# Patient Record
Sex: Male | Born: 1960
Health system: Southern US, Community
[De-identification: ages and names within clinical notes are randomized; demographics above are authoritative.]

## PROBLEM LIST (undated history)

## (undated) DIAGNOSIS — R011 Cardiac murmur, unspecified: Secondary | ICD-10-CM

## (undated) DIAGNOSIS — M1990 Unspecified osteoarthritis, unspecified site: Secondary | ICD-10-CM

## (undated) DIAGNOSIS — R1901 Right upper quadrant abdominal swelling, mass and lump: Secondary | ICD-10-CM

## (undated) DIAGNOSIS — D689 Coagulation defect, unspecified: Secondary | ICD-10-CM

## (undated) DIAGNOSIS — Z9221 Personal history of antineoplastic chemotherapy: Secondary | ICD-10-CM

## (undated) DIAGNOSIS — C189 Malignant neoplasm of colon, unspecified: Principal | ICD-10-CM

## (undated) DIAGNOSIS — IMO0002 Reserved for concepts with insufficient information to code with codable children: Secondary | ICD-10-CM

## (undated) DIAGNOSIS — Z923 Personal history of irradiation: Secondary | ICD-10-CM

## (undated) DIAGNOSIS — I10 Essential (primary) hypertension: Secondary | ICD-10-CM

## (undated) DIAGNOSIS — C4490 Unspecified malignant neoplasm of skin, unspecified: Secondary | ICD-10-CM

## (undated) DIAGNOSIS — E785 Hyperlipidemia, unspecified: Secondary | ICD-10-CM

## (undated) DIAGNOSIS — E611 Iron deficiency: Secondary | ICD-10-CM

## (undated) DIAGNOSIS — I82409 Acute embolism and thrombosis of unspecified deep veins of unspecified lower extremity: Secondary | ICD-10-CM

## (undated) HISTORY — PX: COLON SURGERY: SHX602

## (undated) HISTORY — DX: Right upper quadrant abdominal swelling, mass and lump: R19.01

## (undated) HISTORY — PX: ROTATOR CUFF REPAIR: SHX139

## (undated) HISTORY — DX: Reserved for concepts with insufficient information to code with codable children: IMO0002

## (undated) HISTORY — PX: TONSILLECTOMY: SUR1361

## (undated) HISTORY — DX: Personal history of irradiation: Z92.3

## (undated) HISTORY — DX: Coagulation defect, unspecified: D68.9

## (undated) HISTORY — PX: OTHER SURGICAL HISTORY: SHX169

## (undated) HISTORY — PX: JOINT REPLACEMENT: SHX530

## (undated) HISTORY — DX: Malignant neoplasm of colon, unspecified: C18.9

## (undated) HISTORY — DX: Hyperlipidemia, unspecified: E78.5

## (undated) HISTORY — PX: POLYPECTOMY: SHX149

## (undated) HISTORY — PX: COLONOSCOPY: SHX174

## (undated) HISTORY — DX: Essential (primary) hypertension: I10

## (undated) HISTORY — PX: SKIN CANCER EXCISION: SHX779

## (undated) HISTORY — PX: SMALL INTESTINE SURGERY: SHX150

---

## 1990-05-18 DIAGNOSIS — Z8701 Personal history of pneumonia (recurrent): Secondary | ICD-10-CM

## 1990-05-18 HISTORY — DX: Personal history of pneumonia (recurrent): Z87.01

## 1994-05-18 HISTORY — PX: MOHS SURGERY: SUR867

## 2010-11-10 ENCOUNTER — Other Ambulatory Visit: Payer: Self-pay | Admitting: Family Medicine

## 2010-11-10 DIAGNOSIS — R1900 Intra-abdominal and pelvic swelling, mass and lump, unspecified site: Secondary | ICD-10-CM

## 2010-11-11 ENCOUNTER — Other Ambulatory Visit: Payer: Self-pay | Admitting: Family Medicine

## 2010-11-11 ENCOUNTER — Ambulatory Visit
Admission: RE | Admit: 2010-11-11 | Discharge: 2010-11-11 | Disposition: A | Discharge status: Home / Self Care | Payer: BC Managed Care – PPO | Source: Ambulatory Visit | Attending: Family Medicine | Admitting: Family Medicine

## 2010-11-11 DIAGNOSIS — R1900 Intra-abdominal and pelvic swelling, mass and lump, unspecified site: Secondary | ICD-10-CM

## 2010-11-11 MED ORDER — IOHEXOL 300 MG/ML  SOLN
100.0000 mL | Freq: Once | INTRAMUSCULAR | Status: AC | PRN
  Administered 2010-11-11: 100 mL via INTRAVENOUS

## 2010-11-14 ENCOUNTER — Ambulatory Visit (INDEPENDENT_AMBULATORY_CARE_PROVIDER_SITE_OTHER): Payer: BC Managed Care – PPO | Admitting: General Surgery

## 2010-11-20 ENCOUNTER — Ambulatory Visit (INDEPENDENT_AMBULATORY_CARE_PROVIDER_SITE_OTHER): Payer: BC Managed Care – PPO | Admitting: General Surgery

## 2010-11-20 ENCOUNTER — Encounter (INDEPENDENT_AMBULATORY_CARE_PROVIDER_SITE_OTHER): Payer: Self-pay | Admitting: General Surgery

## 2010-11-20 ENCOUNTER — Telehealth: Payer: Self-pay | Admitting: *Deleted

## 2010-11-20 VITALS — BP 120/82 | HR 73 | Temp 97.6°F | Ht 68.0 in | Wt 158.4 lb

## 2010-11-20 DIAGNOSIS — R1901 Right upper quadrant abdominal swelling, mass and lump: Secondary | ICD-10-CM | POA: Insufficient documentation

## 2010-11-20 MED ORDER — PEG-KCL-NACL-NASULF-NA ASC-C 100 G PO SOLR: 1.0000 | Freq: Once | ORAL | Status: DC

## 2010-11-20 NOTE — Patient Instructions (Signed)
Plan for colonoscopy tomorrow with Dr. Christella Hartigan.

## 2010-11-20 NOTE — Telephone Encounter (Signed)
Pt given instructions for his COLON tomorrow at [facility] with Dr Christella Hartigan. Script sent; unable to print instructions d/t Dr Dwain Sarna in chart when pt arrived here. Pt stated understanding and will call with questions.   [facility] COLON, 11/21/10 1 11:30am, arrive at 10:30am. BOOKING # [id number] with Verlon Au. H&P from Dr Dwain Sarna faxed to [facility]

## 2010-11-20 NOTE — Progress Notes (Signed)
Subjective:     Patient ID: Terry Vargas, male   DOB: January 24, 1961, 50 y.o.   MRN: [id number]    BP 120/82  Pulse 73  Temp(Src) 97.6 F (36.4 C) (Temporal)  Ht 5\' 8"  (1.727 m)  Wt 158 lb 6.4 oz (71.85 kg)  BMI 24.08 kg/m2    HPI This is a healthy 50 year old male who has a recent history of about 3 months of abdominal pain. He notes he has no change in his bowel movements. He has no change in his stool caliber. He has not seen any blood noted in his stools. He does report that Dr. Abigail Miyamoto noted some heme positive stools. He's had fairly normal appetite but has decreased his oral intake due to the pain that he has one each. He has lost 18 pounds over the last 3 months. He is still passing flatus without any difficulty. The pain is aggravated by a large meal. It is relieved by passing flatus and stool. He also notes that he does take a little bit longer for him to urinate at this point as well. He was seen by Dr. Abigail Miyamoto and was referred for a CT scan. His CT scan shows about a 15 cm what appears to be a descending and transverse colon mass with nodal metastases. There is no evidence of any liver metastases present. The remainder his abdomen shows no real abnormalities. He has no history of a colonoscopy in the past. It does sound like he has a family history of colon cancer his father is well as a cousin who was in his 30s when he had colon cancer.  Past Medical History  Diagnosis Date  . Abdominal mass, RUQ (right upper quadrant)    Past Surgical History  Procedure Date  . Mohs surgery     left ear      No current outpatient prescriptions on file.  No Known Allergies   Review of Systems  Constitutional: Positive for unexpected weight change. Negative for fever, chills, diaphoresis, activity change, appetite change and fatigue.  HENT: Negative.   Respiratory: Negative.   Cardiovascular: Negative.   Gastrointestinal: Positive for abdominal distention.  Genitourinary: Positive for  difficulty urinating. Negative for dysuria, urgency and decreased urine volume.  Musculoskeletal: Negative.   Neurological: Negative.   Hematological: Negative.   Psychiatric/Behavioral: Negative.        Objective:   Physical Exam  Constitutional: He appears well-developed and well-nourished. No distress.  HENT:  Head: Normocephalic and atraumatic.  Neck: Neck supple.  Cardiovascular: Normal rate, regular rhythm and normal heart sounds.   Pulmonary/Chest: Effort normal and breath sounds normal. No respiratory distress. He has no wheezes. He has no rales.  Abdominal: Soft. Bowel sounds are normal. He exhibits mass (large right sided abdominal mass that is movable). There is no tenderness. There is no rebound and no guarding.  Lymphadenopathy:    He has no cervical adenopathy.       Assessment:     Abdominal mass    Plan:      I had a prolonged conversation with the patient and his wife today. I discussed with him that there is a right sided abdominal mass it is very large in size. I think this is likely a colon cancer given his symptoms as well as its appearance on the CT scan. I discussed with him the staging and pathophysiology of colon cancer I discussed with him all of the different treatment options for colon cancer. He does have some  mild obstructive symptoms at this point so he is going to need surgery is his first treatment obviously. I discussed an open right hemicolectomy with him. I discussed laparoscopic surgery but has not been to be an option given the size of this tumor. We discussed the risks of surgery including bleeding, infection, anastomotic leak, reoperation, pneumonia, UTI, DVT, PE. They both understand all of these risks him and plan on getting scheduled as soon as possible for this. He is not completely obstructing this is not an emergency at this point. I would like to try and get colonoscopy on him at least up to this mass to prove that the remainder of his colon  is clean especially given his family history. This would certainly change the operative plan if he had more disease that was present it may be up and remove some additional polyps with the colonoscope. Dr. Wendall Papa has kindly agreed to perform his endoscopy tomorrow.

## 2010-11-21 ENCOUNTER — Ambulatory Visit (HOSPITAL_COMMUNITY)
Admission: RE | Admit: 2010-11-21 | Discharge: 2010-11-21 | Disposition: A | Discharge status: Home / Self Care | Payer: BC Managed Care – PPO | Source: Ambulatory Visit | Attending: Gastroenterology | Admitting: Gastroenterology

## 2010-11-21 ENCOUNTER — Other Ambulatory Visit: Payer: BC Managed Care – PPO | Admitting: Gastroenterology

## 2010-11-21 DIAGNOSIS — K921 Melena: Secondary | ICD-10-CM | POA: Insufficient documentation

## 2010-11-21 DIAGNOSIS — K639 Disease of intestine, unspecified: Secondary | ICD-10-CM | POA: Insufficient documentation

## 2010-11-21 DIAGNOSIS — R109 Unspecified abdominal pain: Secondary | ICD-10-CM | POA: Insufficient documentation

## 2010-11-21 DIAGNOSIS — Z800 Family history of malignant neoplasm of digestive organs: Secondary | ICD-10-CM

## 2010-11-21 DIAGNOSIS — R933 Abnormal findings on diagnostic imaging of other parts of digestive tract: Secondary | ICD-10-CM

## 2010-11-21 DIAGNOSIS — C189 Malignant neoplasm of colon, unspecified: Secondary | ICD-10-CM

## 2010-11-24 ENCOUNTER — Other Ambulatory Visit (INDEPENDENT_AMBULATORY_CARE_PROVIDER_SITE_OTHER): Payer: Self-pay | Admitting: General Surgery

## 2010-11-24 ENCOUNTER — Encounter (HOSPITAL_COMMUNITY)
Admission: RE | Admit: 2010-11-24 | Discharge: 2010-11-24 | Disposition: A | Discharge status: Home / Self Care | Payer: BC Managed Care – PPO | Source: Ambulatory Visit | Attending: General Surgery | Admitting: General Surgery

## 2010-11-24 ENCOUNTER — Ambulatory Visit (HOSPITAL_COMMUNITY)
Admission: RE | Admit: 2010-11-24 | Discharge: 2010-11-24 | Disposition: A | Discharge status: Home / Self Care | Payer: BC Managed Care – PPO | Source: Ambulatory Visit | Attending: General Surgery | Admitting: General Surgery

## 2010-11-24 DIAGNOSIS — Z01810 Encounter for preprocedural cardiovascular examination: Secondary | ICD-10-CM | POA: Insufficient documentation

## 2010-11-24 DIAGNOSIS — Z01811 Encounter for preprocedural respiratory examination: Secondary | ICD-10-CM | POA: Insufficient documentation

## 2010-11-24 DIAGNOSIS — Z01812 Encounter for preprocedural laboratory examination: Secondary | ICD-10-CM | POA: Insufficient documentation

## 2010-11-24 DIAGNOSIS — R1901 Right upper quadrant abdominal swelling, mass and lump: Secondary | ICD-10-CM

## 2010-11-24 LAB — CBC
HCT: 31.7 % — ABNORMAL LOW (ref 39.0–52.0)
MCHC: 32.2 g/dL (ref 30.0–36.0)
Platelets: 391 10*3/uL (ref 150–400)
RDW: 14.3 % (ref 11.5–15.5)
WBC: 9.1 10*3/uL (ref 4.0–10.5)

## 2010-11-24 LAB — BASIC METABOLIC PANEL
BUN: 34 mg/dL — ABNORMAL HIGH (ref 6–23)
Calcium: 9.3 mg/dL (ref 8.4–10.5)
Creatinine, Ser: 0.94 mg/dL (ref 0.50–1.35)
GFR calc Af Amer: >60 mL/min (ref >60)
GFR calc non Af Amer: >60 mL/min (ref >60)
Glucose, Bld: 108 mg/dL — ABNORMAL HIGH (ref 70–99)
Potassium: 4.2 mEq/L (ref 3.5–5.1)

## 2010-11-24 LAB — CEA: CEA: 1 ng/mL (ref 0.0–5.0)

## 2010-11-25 ENCOUNTER — Other Ambulatory Visit (INDEPENDENT_AMBULATORY_CARE_PROVIDER_SITE_OTHER): Payer: Self-pay | Admitting: General Surgery

## 2010-11-25 ENCOUNTER — Inpatient Hospital Stay (HOSPITAL_COMMUNITY)
Admission: RE | Admit: 2010-11-25 | Discharge: 2010-12-03 | DRG: 148 | Disposition: A | Discharge status: Home / Self Care | Payer: BC Managed Care – PPO | Source: Ambulatory Visit | Attending: General Surgery | Admitting: General Surgery

## 2010-11-25 DIAGNOSIS — C184 Malignant neoplasm of transverse colon: Principal | ICD-10-CM | POA: Diagnosis present

## 2010-11-25 DIAGNOSIS — Z800 Family history of malignant neoplasm of digestive organs: Secondary | ICD-10-CM

## 2010-11-25 DIAGNOSIS — C189 Malignant neoplasm of colon, unspecified: Secondary | ICD-10-CM

## 2010-11-25 DIAGNOSIS — K560 Paralytic ileus: Secondary | ICD-10-CM | POA: Diagnosis not present

## 2010-11-25 HISTORY — PX: OTHER SURGICAL HISTORY: SHX169

## 2010-11-26 LAB — CBC
HCT: 30 % — ABNORMAL LOW (ref 39.0–52.0)
Hemoglobin: 9.6 g/dL — ABNORMAL LOW (ref 13.0–17.0)
MCHC: 32 g/dL (ref 30.0–36.0)
MCV: 78.9 fL (ref 78.0–100.0)
RDW: 14.4 % (ref 11.5–15.5)

## 2010-11-26 LAB — TYPE AND SCREEN: Antibody Screen: NEGATIVE

## 2010-11-26 LAB — BASIC METABOLIC PANEL
BUN: 14 mg/dL (ref 6–23)
Chloride: 104 mEq/L (ref 96–112)
Creatinine, Ser: 0.91 mg/dL (ref 0.50–1.35)
GFR calc Af Amer: >60 mL/min (ref >60)
GFR calc non Af Amer: >60 mL/min (ref >60)
Glucose, Bld: 124 mg/dL — ABNORMAL HIGH (ref 70–99)
Potassium: 4 mEq/L (ref 3.5–5.1)

## 2010-11-27 NOTE — Op Note (Signed)
NAMEMAHDI, FRYE NO.:  [id number]  MEDICAL RECORD NO.:  [id number]  LOCATION:  5150                         FACILITY:  [facility]  PHYSICIAN:  Juanetta Gosling, MDDATE OF BIRTH:  20-May-1960  DATE OF PROCEDURE:  11/25/2010 DATE OF DISCHARGE:                              OPERATIVE REPORT   PREOPERATIVE DIAGNOSIS:  Right colon mass.  POSTOPERATIVE DIAGNOSIS:  Proximal transverse colon mass, locally advanced.  PROCEDURES: 1. Open right hemicolectomy. 2. Mobilization of splenic flexure.  SURGEON:  Juanetta Gosling, MD  ASSISTANT:  Wilmon Arms. Corliss Skains, MD  ANESTHESIA:  General.  SUPERVISING ANESTHESIOLOGIST:  Quita Skye. Krista Blue, MD  ESTIMATED BLOOD LOSS:  200 mL.  DRAINS:  A 19-French Blake drain above the duodenum and pancreas.  COMPLICATIONS:  None.  DISPOSITION:  To recovery room in stable condition.  SPECIMENS:  Right colon and nodal base sent to pathology.  INDICATIONS:  This is a 50 year old male with a recent onset of abdominal pain.  He has a family history of colon cancer.  He was sent to me for a CT scan showing a very large intra-abdominal mass.  This did appear to be a colonic primary and was palpable.  On his examination, he was not obstructed.  At that time, I sent him to Dr. Wendall Papa for evaluation with a colonoscopy to clear his colon up to this point.  This was clear.  I then prepped for surgery and we discussed right colectomy versus an extended right colectomy.  PROCEDURE:  After informed consent was obtained from the patient, he first underwent a bowel preparation at home.  He was administered 5000 units of subcutaneous heparin,entereg and 1 g of intravenous ertapenem prior to beginning surgery.  He was then placed under general anesthesia without complication and orogastric tube and Foley catheter were placed.  His abdomen was prepped and draped in a standard sterile surgical fashion.  Surgical time-out was then  performed.  A midline incision was then made.  This was later extended both above and below due to the size of the tumor.  His peritoneum was entered without any difficulty.  A Balfour retractor was then placed.  He was noted to have a very large locally advanced proximal transverse colon tumor that had ascending colon adherent to it.  This was also appeared to be adherent closely avulsed to his duodenum and the anterior surface of his pancreas.  I took down his white line of Toldt on the right side. This was then rolled up to C-loop of his duodenum.  The small bowel was also mobilized in a similar fashion.  This was also very near his greater curvature of the stomach.  I then came out of it from the other side of the transverse colon after releasing the omentum off in its entirety for two-thirds of the transverse colon.  I then entered into the lesser sac and identified the colonic mesentery.  This was scored. I then spent a fair amount of time doing a dissection where there was tumor as well as significant desmoplastic reaction on the anteriorsurface of the duodenum and his pancreas.  He had a number of enlarged lymph nodes as  well.  With great care, I then dissected this off the anterior surface of the duodenum and pancreas using a combination of silk ligatures and silk ties.  This did not appear to involve the duodenum and after we finally released this from the duodenum, there was some difficulty.  The anterior surface the duodenum appeared clean. This did appear mostly to be desmoplastic reaction.  Eventually, also freed this off the inferior surface of the pancreas as well.  Once I had this, I then pick the spot about 6 cm distal to the tumor on the transverse colon and divided the colon.  I then treated the small bowel in a similar fashion.  I then divided the mesentery down to its base, then used a combination of LigaSure and silk ties to oversew this from either side until I  reached a portion where the tumor appeared to be close to the pancreas.  I was able to obtain a margin grossly with this and eventually, there were two large veins draining the tumor.  Both of these were ligated with silk ligatures.  I then removed the specimen. We obtained hemostasis.  There was a fair amount of oozing on the anterior surface of pancreas and duodenum.  I placed a sponge overlying this for a while and it stopped.  I then irrigated copiously.  I then placed a sponge overlying this and then returned to the anastomosis. This ended up being an extended right hemicolectomy, so I did mobilize the splenic flexure as well as the portion of his descending colon with the combination of blunt dissection as well as electrocautery. Eventually, I was able to free this and the TI was very mobile, so I approximated these two with 3-0 silk sutures.  I then created a common enterotomy with a GIA stapler.  I then closed this with a TA stapler.  I oversewed the staple line with areas that were bleeding.  This appeared this was patent.  I then placed two crotch stitches of 3-0 silk to prevent any kinking from happening.  The defect was too big to close and it was right down on the duodenum and the surface of the pancreas, but there was not really any tissue even close to either side, not closed this defect.  Irrigation was then performed again.  I placed a 19-French Blake drain overlying the pancreas and duodenum.  This was secured with an 2-0 nylon suture.  The omentum was placed over the anastomosis, what was left of the omentum.  The sponge and needle count was correct at this point.  We then closed with #1 loop PDS and then the skin was stapled with Telfa wicks.  Dressings were placed.  The drain was functional.  He was extubated and transferred to recovery room in stable condition.     Juanetta Gosling, MD     MCW/MEDQ  D:  11/25/2010  T:  11/26/2010  Job:   [id number]  Electronically Signed by Emelia Loron MD on 11/27/2010 04:29:55 PM

## 2010-11-28 LAB — CBC
HCT: 24.7 % — ABNORMAL LOW (ref 39.0–52.0)
Hemoglobin: 8 g/dL — ABNORMAL LOW (ref 13.0–17.0)
RBC: 3.12 MIL/uL — ABNORMAL LOW (ref 4.22–5.81)

## 2010-11-28 LAB — BASIC METABOLIC PANEL
BUN: 7 mg/dL (ref 6–23)
CO2: 29 mEq/L (ref 19–32)
Glucose, Bld: 120 mg/dL — ABNORMAL HIGH (ref 70–99)
Potassium: 4.2 mEq/L (ref 3.5–5.1)
Sodium: 135 mEq/L (ref 135–145)

## 2010-11-30 LAB — BASIC METABOLIC PANEL
BUN: 9 mg/dL (ref 6–23)
CO2: 28 mEq/L (ref 19–32)
Calcium: 8.5 mg/dL (ref 8.4–10.5)
Chloride: 99 mEq/L (ref 96–112)
Creatinine, Ser: 0.78 mg/dL (ref 0.50–1.35)
GFR calc Af Amer: >60 mL/min (ref >60)
GFR calc non Af Amer: >60 mL/min (ref >60)
Glucose, Bld: 137 mg/dL — ABNORMAL HIGH (ref 70–99)
Potassium: 4.4 mEq/L (ref 3.5–5.1)
Sodium: 135 mEq/L (ref 135–145)

## 2010-11-30 LAB — CBC
HCT: 29.7 % — ABNORMAL LOW (ref 39.0–52.0)
Hemoglobin: 9.6 g/dL — ABNORMAL LOW (ref 13.0–17.0)
MCH: 25.2 pg — ABNORMAL LOW (ref 26.0–34.0)
MCHC: 32.3 g/dL (ref 30.0–36.0)
MCV: 78 fL (ref 78.0–100.0)
Platelets: 392 10*3/uL (ref 150–400)
RBC: 3.81 MIL/uL — ABNORMAL LOW (ref 4.22–5.81)
RDW: 14.2 % (ref 11.5–15.5)
WBC: 7 10*3/uL (ref 4.0–10.5)

## 2010-11-30 LAB — CREATININE, FLUID (PLEURAL, PERITONEAL, JP DRAINAGE): Creat, Fluid: 0.8 mg/dL

## 2010-11-30 LAB — AMYLASE, BODY FLUID: Amylase, Fluid: 42 U/L

## 2010-12-01 ENCOUNTER — Inpatient Hospital Stay (HOSPITAL_COMMUNITY): Payer: BC Managed Care – PPO

## 2010-12-02 ENCOUNTER — Encounter (INDEPENDENT_AMBULATORY_CARE_PROVIDER_SITE_OTHER): Payer: Self-pay | Admitting: General Surgery

## 2010-12-02 ENCOUNTER — Other Ambulatory Visit (INDEPENDENT_AMBULATORY_CARE_PROVIDER_SITE_OTHER): Payer: Self-pay | Admitting: General Surgery

## 2010-12-02 DIAGNOSIS — C189 Malignant neoplasm of colon, unspecified: Secondary | ICD-10-CM | POA: Insufficient documentation

## 2010-12-02 HISTORY — DX: Malignant neoplasm of colon, unspecified: C18.9

## 2010-12-04 ENCOUNTER — Telehealth (INDEPENDENT_AMBULATORY_CARE_PROVIDER_SITE_OTHER): Payer: Self-pay

## 2010-12-04 NOTE — Telephone Encounter (Signed)
Faxed request for appt. With Dr Truett Perna to [facility]/ [facility] 12-04-10

## 2010-12-05 ENCOUNTER — Ambulatory Visit (INDEPENDENT_AMBULATORY_CARE_PROVIDER_SITE_OTHER): Payer: BC Managed Care – PPO

## 2010-12-05 DIAGNOSIS — C189 Malignant neoplasm of colon, unspecified: Secondary | ICD-10-CM

## 2010-12-05 NOTE — Progress Notes (Signed)
Pt came in for staple removal of colon sx by Dr Dwain Sarna. The pt tolerated the staples being removed along with steri strips applied to the incision. The pt's incision started to drain when pt stood up so I covered the incision with 4x4 gauze and advised pt if any concerns to call our office. Pt has follow up appt with Dr Dwain Sarna on 12-19-10. [facility]

## 2010-12-16 ENCOUNTER — Encounter (HOSPITAL_BASED_OUTPATIENT_CLINIC_OR_DEPARTMENT_OTHER): Payer: BC Managed Care – PPO | Admitting: Oncology

## 2010-12-16 DIAGNOSIS — C182 Malignant neoplasm of ascending colon: Secondary | ICD-10-CM

## 2010-12-17 ENCOUNTER — Telehealth (INDEPENDENT_AMBULATORY_CARE_PROVIDER_SITE_OTHER): Payer: Self-pay

## 2010-12-17 NOTE — Telephone Encounter (Signed)
LMOM for Dr Kalman Drape nurse to call me so we can find out if pt needs PAC insertion./ [facility] 12-17-10

## 2010-12-19 ENCOUNTER — Other Ambulatory Visit: Payer: Self-pay | Admitting: Oncology

## 2010-12-19 ENCOUNTER — Telehealth (INDEPENDENT_AMBULATORY_CARE_PROVIDER_SITE_OTHER): Payer: Self-pay

## 2010-12-19 ENCOUNTER — Encounter (INDEPENDENT_AMBULATORY_CARE_PROVIDER_SITE_OTHER): Payer: Self-pay | Admitting: General Surgery

## 2010-12-19 ENCOUNTER — Ambulatory Visit (INDEPENDENT_AMBULATORY_CARE_PROVIDER_SITE_OTHER): Payer: BC Managed Care – PPO | Admitting: General Surgery

## 2010-12-19 DIAGNOSIS — C189 Malignant neoplasm of colon, unspecified: Secondary | ICD-10-CM

## 2010-12-19 DIAGNOSIS — Z09 Encounter for follow-up examination after completed treatment for conditions other than malignant neoplasm: Secondary | ICD-10-CM

## 2010-12-19 NOTE — Progress Notes (Signed)
Subjective:     Patient ID: Terry Vargas, male   DOB: January 09, 1961, 50 y.o.   MRN: [id number]  HPI This is a 50 year old male who was referred to me for a large right upper quadrant mass. This appeared to be emanating from his colon preoperative CT scan that he had had. He underwent a colonoscopy up to this mass by Dr. Christella Hartigan and there were no other lesions at that point. He then underwent an extended right hemicolectomy for what ended up being a node-positive colon cancer. He did well postoperatively and was discharged from the hospital. He now is having one bowel movement per day, eating well, and returning to normal activities. He has been evaluated by Dr. Truett Perna in the decision is made obviously to begin chemotherapy. We've reviewed his CT scans as well as his history in our multidisciplinary gastrointestinal cancer conference. He does have a couple of other smaller nodes in his celiac area as well as posterior to his pancreas. These are going to be evaluated by PET scan prior to beginning other treatments. I plan on placing a port in him next week.  Review of Systems     Objective:   Physical Exam Well healing abdominal incision without infection, soft, nontender     Assessment:     S/p extended right colectomy for locally advanced colon cancer    Plan:     He is doing well after surgery. He is due to begin chemotherapy in about 2 weeks at this point. I discussed with him a port placement today. We discussed the risks including bleeding, infection, and pneumothorax. He would like this placed on the left side which I will attempt to do.  I released him to full activity today as well.

## 2010-12-19 NOTE — Telephone Encounter (Signed)
Returned voicemail message about what prep to do before surgery. I lmom that the pt can't have anything to eat or drink after midnight the night before surgey./ [facility]

## 2010-12-23 ENCOUNTER — Encounter: Payer: Self-pay | Admitting: General Surgery

## 2010-12-24 ENCOUNTER — Ambulatory Visit (HOSPITAL_COMMUNITY): Payer: BC Managed Care – PPO

## 2010-12-24 ENCOUNTER — Ambulatory Visit (HOSPITAL_COMMUNITY)
Admission: RE | Admit: 2010-12-24 | Discharge: 2010-12-24 | Disposition: A | Discharge status: Home / Self Care | Payer: BC Managed Care – PPO | Source: Ambulatory Visit | Attending: General Surgery | Admitting: General Surgery

## 2010-12-24 DIAGNOSIS — C189 Malignant neoplasm of colon, unspecified: Secondary | ICD-10-CM | POA: Insufficient documentation

## 2010-12-24 LAB — PROTIME-INR: Prothrombin Time: 14.1 seconds (ref 11.6–15.2)

## 2010-12-24 LAB — BASIC METABOLIC PANEL
BUN: 25 mg/dL — ABNORMAL HIGH (ref 6–23)
Calcium: 9.6 mg/dL (ref 8.4–10.5)
Creatinine, Ser: 0.87 mg/dL (ref 0.50–1.35)
GFR calc Af Amer: >60 mL/min (ref >60)

## 2010-12-24 LAB — CBC
HCT: 30.7 % — ABNORMAL LOW (ref 39.0–52.0)
Hemoglobin: 9.9 g/dL — ABNORMAL LOW (ref 13.0–17.0)
MCH: 24.1 pg — ABNORMAL LOW (ref 26.0–34.0)
MCHC: 32.2 g/dL (ref 30.0–36.0)
MCV: 74.9 fL — ABNORMAL LOW (ref 78.0–100.0)

## 2010-12-24 LAB — DIFFERENTIAL
Basophils Relative: 0 % (ref 0–1)
Eosinophils Absolute: 0.2 10*3/uL (ref 0.0–0.7)
Monocytes Absolute: 0.6 10*3/uL (ref 0.1–1.0)
Monocytes Relative: 9 % (ref 3–12)

## 2010-12-25 NOTE — Op Note (Signed)
Terry Vargas, Terry Vargas NO.:  [id number]  MEDICAL RECORD NO.:  [id number]  LOCATION:  DAYL                         FACILITY:  [facility]  PHYSICIAN:  Juanetta Gosling, MDDATE OF BIRTH:  Jun 30, 1960  DATE OF PROCEDURE: DATE OF DISCHARGE:  12/24/2010                              OPERATIVE REPORT   PREOPERATIVE DIAGNOSIS:  Colon cancer status post extended right colectomy.  POSTOPERATIVE DIAGNOSIS:  Colon cancer status post extended right colectomy.  PROCEDURE:  Left subclavian 8-French titanium PowerPort insertion.  SURGEON:  Juanetta Gosling, MD  ASSISTANT:  None.  ANESTHESIA:  General with LMA.  SPECIMENS:  None.  DRAINS:  None.  COMPLICATIONS:  None.  ESTIMATED BLOOD LOSS:  Minimal.  DISPOSITION:  To recovery room in stable condition.  INDICATIONS:  This is a 50 year old male who I saw recently after a very large ascending and transverse colon mass.  He underwent an extended right colectomy for a node positive locally advanced colon cancer.  He also has a couple of nodes in the iliac axis and posterior to his pancreas.  He was then seen by Dr. Truett Perna for adjuvant therapy consideration and we discussed port placement.  We discussed the risks and benefits associated with this procedure.  PROCEDURE IN DETAIL:  After informed consent was obtained, the patient was taken to the operating room.  He had sequential compression devices placed in lower extremities prior to induction of anesthesia.  He was administered 1 g of intravenous cefazolin by choice.  He underwent general anesthesia with an LMA.  He then was prepped and draped in a standard sterile surgical fashion.  Surgical time-out was then performed.  I infiltrated 0.25% Marcaine throughout his left chest wall as well as his clavicle.  I then accessed his subclavian vein on first pass.  I then made a pocket below this overlying the pectoralis muscle.  I then enlarged the insertion site  with a #11 blade.  I had to actually dilate this with a snap as the fascia was very vigorous.  I then used the dilator to enlarge the hole.  I then placed the sheath and the dilator in it following the sit with fluoroscopy.  This was in correct position. I reviewed the wire assembly.  I then placed the line.  This was placed deep.  At that point, I removed the peel-away sheath.  I then tunneled the line to the port site.  I then, under fluoro, pulled back the line to loosen the distal cava.  I clipped the line and secured this to the port with the connector.  I then sewed the port into place with 2-0 Prolene in three positions.  I then flushed this which I did easily and aspirated blood.  I then packed this with heparin.  An additional fluoroscopy view showed the line to be in good position with the sit into the distal cava and no kinks.  The port also was in good position. I then closed this with 3-0 Vicryl, 4-0 Monocryl and Dermabond.  He tolerated this well, was extubated, and transferred to recovery in stable condition.    Juanetta Gosling, MD     MCW/MEDQ  D:  12/24/2010  T:  12/25/2010  Job:  [id number]  cc:   Dr. Gabriel Rainwater K. Abigail Miyamoto, M.D. Fax: [phone]  Electronically Signed by Emelia Loron MD on 12/25/2010 01:59:47 PM

## 2010-12-26 NOTE — Discharge Summary (Signed)
  NAMEDAXEN, LANUM NO.:  [id number]  MEDICAL RECORD NO.:  [id number]  LOCATION:  5150                         FACILITY:  [facility]  PHYSICIAN:  Juanetta Gosling, MDDATE OF BIRTH:  Jul 04, 1960  DATE OF ADMISSION:  11/25/2010 DATE OF DISCHARGE:  12/03/2010                              DISCHARGE SUMMARY   ADMISSION DIAGNOSIS:  Right-sided colon mass.  DISCHARGE DIAGNOSES: 1. Right colon cancer. 2. Status post ileus.  HISTORY AND HOSPITAL COURSE:  This is a 50 year old male who was sent to me by Dr. Tedra Senegal.  Dr. Tedra Senegal noted some heme-positive stools as well as an abdominal mass.  A CT scan showed about 15-cm ascending and transverse colon mass with likely nodal metastases.  He underwent a colonoscopy to the mass to try to clear the rest of his colon.  This was all negative.  At this point in time, he did not know he had a very large family history.  Eventually, he realized that his family does have a history of Lynch syndrome.  He was admitted on November 25, 2010, underwent an open extended right hemicolectomy with mobilization of splenic flexure and primary anastomosis.  Postoperatively, he had an ileus.  His labs remained within normal limits.  He was maintained on a PCA.entereg, as well as subcutaneous heparin.  By postoperative day #5, he was tolerating clears at that point.  By postoperative day #7, he began having bowel movements and passing flatus.  His diet was advanced and he was discharged home on postoperative day #8.  CONSULTS:  Medical Oncology, Dr. Mancel Bale.  PROCEDURES:  November 25, 2010, open extended right hemicolectomy with mobilization of splenic flexure.  PERTINENT RADIOLOGIC EVALUATION:  An abdominal film consistent with an ileus on December 01, 2010.  PERTINENT LABORATORY EVALUATION:  Shows a discharge white blood cell count of 7 and a creatinine of 0.78.  DISCHARGE INSTRUCTIONS:  He was to follow up at [facility] in 2  weeks with Dr. Dwain Sarna, otherwise per his instructions were given at the time of discharge.  MEDICATIONS UPON DISCHARGE:  Ibuprofen and Percocet as needed for pain.     Juanetta Gosling, MD     MCW/MEDQ  D:  12/25/2010  T:  12/26/2010  Job:  [id number]  Electronically Signed by Emelia Loron MD on 12/26/2010 04:01:19 AM

## 2010-12-29 ENCOUNTER — Encounter (HOSPITAL_COMMUNITY)
Admission: RE | Admit: 2010-12-29 | Discharge: 2010-12-29 | Disposition: A | Discharge status: Home / Self Care | Payer: BC Managed Care – PPO | Source: Ambulatory Visit | Attending: Oncology | Admitting: Oncology

## 2010-12-29 ENCOUNTER — Other Ambulatory Visit: Payer: Self-pay | Admitting: Oncology

## 2010-12-29 ENCOUNTER — Encounter (HOSPITAL_BASED_OUTPATIENT_CLINIC_OR_DEPARTMENT_OTHER): Payer: BC Managed Care – PPO | Admitting: Oncology

## 2010-12-29 DIAGNOSIS — C189 Malignant neoplasm of colon, unspecified: Secondary | ICD-10-CM | POA: Insufficient documentation

## 2010-12-29 DIAGNOSIS — I2510 Atherosclerotic heart disease of native coronary artery without angina pectoris: Secondary | ICD-10-CM | POA: Insufficient documentation

## 2010-12-29 DIAGNOSIS — C184 Malignant neoplasm of transverse colon: Secondary | ICD-10-CM

## 2010-12-29 DIAGNOSIS — R599 Enlarged lymph nodes, unspecified: Secondary | ICD-10-CM | POA: Insufficient documentation

## 2010-12-29 LAB — CBC WITH DIFFERENTIAL/PLATELET
Basophils Absolute: 0 10*3/uL (ref 0.0–0.1)
EOS%: 5.4 % (ref 0.0–7.0)
HCT: 29.2 % — ABNORMAL LOW (ref 38.4–49.9)
HGB: 9.7 g/dL — ABNORMAL LOW (ref 13.0–17.1)
MCH: 25.3 pg — ABNORMAL LOW (ref 27.2–33.4)
MCV: 76.2 fL — ABNORMAL LOW (ref 79.3–98.0)
MONO%: 9.3 % (ref 0.0–14.0)
NEUT%: 60.8 % (ref 39.0–75.0)
Platelets: 295 10*3/uL (ref 140–400)
lymph#: 1.4 10*3/uL (ref 0.9–3.3)

## 2010-12-29 LAB — COMPREHENSIVE METABOLIC PANEL
Albumin: 3.7 g/dL (ref 3.5–5.2)
Alkaline Phosphatase: 54 U/L (ref 39–117)
BUN: 24 mg/dL — ABNORMAL HIGH (ref 6–23)
Glucose, Bld: 96 mg/dL (ref 70–99)
Potassium: 4.1 mEq/L (ref 3.5–5.3)

## 2010-12-29 MED ORDER — FLUDEOXYGLUCOSE F - 18 (FDG) INJECTION
16.8000 | Freq: Once | INTRAVENOUS | Status: AC | PRN
  Administered 2010-12-29: 16.8 via INTRAVENOUS

## 2010-12-31 ENCOUNTER — Encounter (HOSPITAL_BASED_OUTPATIENT_CLINIC_OR_DEPARTMENT_OTHER): Payer: BC Managed Care – PPO | Admitting: Oncology

## 2010-12-31 DIAGNOSIS — C184 Malignant neoplasm of transverse colon: Secondary | ICD-10-CM

## 2010-12-31 DIAGNOSIS — C182 Malignant neoplasm of ascending colon: Secondary | ICD-10-CM

## 2010-12-31 DIAGNOSIS — R599 Enlarged lymph nodes, unspecified: Secondary | ICD-10-CM

## 2010-12-31 DIAGNOSIS — Z5111 Encounter for antineoplastic chemotherapy: Secondary | ICD-10-CM

## 2011-01-02 ENCOUNTER — Encounter (HOSPITAL_BASED_OUTPATIENT_CLINIC_OR_DEPARTMENT_OTHER): Payer: BC Managed Care – PPO | Admitting: Oncology

## 2011-01-02 DIAGNOSIS — C182 Malignant neoplasm of ascending colon: Secondary | ICD-10-CM

## 2011-01-02 DIAGNOSIS — C184 Malignant neoplasm of transverse colon: Secondary | ICD-10-CM

## 2011-01-05 ENCOUNTER — Telehealth (INDEPENDENT_AMBULATORY_CARE_PROVIDER_SITE_OTHER): Payer: Self-pay

## 2011-01-05 ENCOUNTER — Encounter (INDEPENDENT_AMBULATORY_CARE_PROVIDER_SITE_OTHER): Payer: Self-pay

## 2011-01-05 NOTE — Telephone Encounter (Signed)
Returned pt's voicemail about needing a note to return to work. Pt requested to go back tomorrow. I completed a note for the pt to go back to work 01-06-11 full duty. The pt will come by the office to p/u at front desk.Hulda Humphrey

## 2011-01-14 ENCOUNTER — Encounter (HOSPITAL_BASED_OUTPATIENT_CLINIC_OR_DEPARTMENT_OTHER): Payer: BC Managed Care – PPO | Admitting: Oncology

## 2011-01-14 ENCOUNTER — Other Ambulatory Visit: Payer: Self-pay | Admitting: Oncology

## 2011-01-14 DIAGNOSIS — Z5111 Encounter for antineoplastic chemotherapy: Secondary | ICD-10-CM

## 2011-01-14 DIAGNOSIS — C184 Malignant neoplasm of transverse colon: Secondary | ICD-10-CM

## 2011-01-14 LAB — COMPREHENSIVE METABOLIC PANEL
Albumin: 3.3 g/dL — ABNORMAL LOW (ref 3.5–5.2)
BUN: 21 mg/dL (ref 6–23)
CO2: 28 mEq/L (ref 19–32)
Calcium: 9.4 mg/dL (ref 8.4–10.5)
Chloride: 103 mEq/L (ref 96–112)
Glucose, Bld: 114 mg/dL — ABNORMAL HIGH (ref 70–99)
Potassium: 3.9 mEq/L (ref 3.5–5.3)

## 2011-01-14 LAB — CBC WITH DIFFERENTIAL/PLATELET
Basophils Absolute: 0 10*3/uL (ref 0.0–0.1)
Eosinophils Absolute: 0.3 10*3/uL (ref 0.0–0.5)
HGB: 10.1 g/dL — ABNORMAL LOW (ref 13.0–17.1)
MCV: 77.9 fL — ABNORMAL LOW (ref 79.3–98.0)
MONO#: 0.4 10*3/uL (ref 0.1–0.9)
NEUT#: 2.5 10*3/uL (ref 1.5–6.5)
RDW: 16.6 % — ABNORMAL HIGH (ref 11.0–14.6)
lymph#: 1.3 10*3/uL (ref 0.9–3.3)

## 2011-01-16 ENCOUNTER — Encounter (HOSPITAL_BASED_OUTPATIENT_CLINIC_OR_DEPARTMENT_OTHER): Payer: BC Managed Care – PPO | Admitting: Oncology

## 2011-01-16 DIAGNOSIS — C184 Malignant neoplasm of transverse colon: Secondary | ICD-10-CM

## 2011-01-28 ENCOUNTER — Encounter (HOSPITAL_BASED_OUTPATIENT_CLINIC_OR_DEPARTMENT_OTHER): Payer: BC Managed Care – PPO | Admitting: Oncology

## 2011-01-28 ENCOUNTER — Other Ambulatory Visit: Payer: Self-pay | Admitting: Oncology

## 2011-01-28 ENCOUNTER — Ambulatory Visit (HOSPITAL_COMMUNITY)
Admission: RE | Admit: 2011-01-28 | Discharge: 2011-01-28 | Disposition: A | Discharge status: Home / Self Care | Payer: BC Managed Care – PPO | Source: Ambulatory Visit | Attending: Oncology | Admitting: Oncology

## 2011-01-28 DIAGNOSIS — M7989 Other specified soft tissue disorders: Secondary | ICD-10-CM

## 2011-01-28 DIAGNOSIS — I82409 Acute embolism and thrombosis of unspecified deep veins of unspecified lower extremity: Secondary | ICD-10-CM | POA: Insufficient documentation

## 2011-01-28 DIAGNOSIS — M79609 Pain in unspecified limb: Secondary | ICD-10-CM

## 2011-01-28 DIAGNOSIS — C189 Malignant neoplasm of colon, unspecified: Secondary | ICD-10-CM

## 2011-01-28 DIAGNOSIS — C184 Malignant neoplasm of transverse colon: Secondary | ICD-10-CM

## 2011-01-28 DIAGNOSIS — Z5111 Encounter for antineoplastic chemotherapy: Secondary | ICD-10-CM

## 2011-01-28 DIAGNOSIS — I82890 Acute embolism and thrombosis of other specified veins: Secondary | ICD-10-CM | POA: Insufficient documentation

## 2011-01-28 HISTORY — DX: Acute embolism and thrombosis of unspecified deep veins of unspecified lower extremity: I82.409

## 2011-01-28 LAB — COMPREHENSIVE METABOLIC PANEL
ALT: 17 U/L (ref 0–53)
Albumin: 3.4 g/dL — ABNORMAL LOW (ref 3.5–5.2)
Alkaline Phosphatase: 65 U/L (ref 39–117)
CO2: 28 mEq/L (ref 19–32)
Glucose, Bld: 104 mg/dL — ABNORMAL HIGH (ref 70–99)
Potassium: 4.1 mEq/L (ref 3.5–5.3)
Sodium: 137 mEq/L (ref 135–145)
Total Protein: 6.8 g/dL (ref 6.0–8.3)

## 2011-01-28 LAB — CBC WITH DIFFERENTIAL/PLATELET
Basophils Absolute: 0.1 10*3/uL (ref 0.0–0.1)
EOS%: 2.6 % (ref 0.0–7.0)
Eosinophils Absolute: 0.1 10*3/uL (ref 0.0–0.5)
HGB: 10.9 g/dL — ABNORMAL LOW (ref 13.0–17.1)
NEUT#: 3 10*3/uL (ref 1.5–6.5)
RBC: 4.26 10*6/uL (ref 4.20–5.82)
RDW: 18.7 % — ABNORMAL HIGH (ref 11.0–14.6)
lymph#: 1.3 10*3/uL (ref 0.9–3.3)
nRBC: 0 % (ref 0–0)

## 2011-01-29 ENCOUNTER — Encounter (HOSPITAL_BASED_OUTPATIENT_CLINIC_OR_DEPARTMENT_OTHER): Payer: BC Managed Care – PPO | Admitting: Oncology

## 2011-01-29 DIAGNOSIS — I82890 Acute embolism and thrombosis of other specified veins: Secondary | ICD-10-CM

## 2011-01-29 DIAGNOSIS — C184 Malignant neoplasm of transverse colon: Secondary | ICD-10-CM

## 2011-01-30 ENCOUNTER — Encounter (HOSPITAL_BASED_OUTPATIENT_CLINIC_OR_DEPARTMENT_OTHER): Payer: BC Managed Care – PPO | Admitting: Oncology

## 2011-01-30 DIAGNOSIS — C184 Malignant neoplasm of transverse colon: Secondary | ICD-10-CM

## 2011-02-11 ENCOUNTER — Other Ambulatory Visit: Payer: Self-pay | Admitting: Oncology

## 2011-02-11 ENCOUNTER — Encounter (HOSPITAL_BASED_OUTPATIENT_CLINIC_OR_DEPARTMENT_OTHER): Payer: BC Managed Care – PPO | Admitting: Oncology

## 2011-02-11 DIAGNOSIS — Z5111 Encounter for antineoplastic chemotherapy: Secondary | ICD-10-CM

## 2011-02-11 DIAGNOSIS — I824Z9 Acute embolism and thrombosis of unspecified deep veins of unspecified distal lower extremity: Secondary | ICD-10-CM

## 2011-02-11 DIAGNOSIS — C184 Malignant neoplasm of transverse colon: Secondary | ICD-10-CM

## 2011-02-11 DIAGNOSIS — C189 Malignant neoplasm of colon, unspecified: Secondary | ICD-10-CM

## 2011-02-11 LAB — CBC WITH DIFFERENTIAL/PLATELET
BASO%: 1 % (ref 0.0–2.0)
EOS%: 3.7 % (ref 0.0–7.0)
Eosinophils Absolute: 0.2 10*3/uL (ref 0.0–0.5)
LYMPH%: 30.1 % (ref 14.0–49.0)
MCHC: 33.2 g/dL (ref 32.0–36.0)
MCV: 79.1 fL — ABNORMAL LOW (ref 79.3–98.0)
MONO%: 15.2 % — ABNORMAL HIGH (ref 0.0–14.0)
NEUT#: 2 10*3/uL (ref 1.5–6.5)
RBC: 4.3 10*6/uL (ref 4.20–5.82)
RDW: 20.2 % — ABNORMAL HIGH (ref 11.0–14.6)
nRBC: 0 % (ref 0–0)

## 2011-02-11 LAB — COMPREHENSIVE METABOLIC PANEL
ALT: 78 U/L — ABNORMAL HIGH (ref 0–53)
AST: 43 U/L — ABNORMAL HIGH (ref 0–37)
Alkaline Phosphatase: 63 U/L (ref 39–117)
CO2: 28 mEq/L (ref 19–32)
Creatinine, Ser: 0.88 mg/dL (ref 0.50–1.35)
Sodium: 137 mEq/L (ref 135–145)
Total Bilirubin: 0.2 mg/dL — ABNORMAL LOW (ref 0.3–1.2)
Total Protein: 7 g/dL (ref 6.0–8.3)

## 2011-02-13 ENCOUNTER — Encounter (HOSPITAL_BASED_OUTPATIENT_CLINIC_OR_DEPARTMENT_OTHER): Payer: BC Managed Care – PPO | Admitting: Oncology

## 2011-02-13 DIAGNOSIS — C189 Malignant neoplasm of colon, unspecified: Secondary | ICD-10-CM

## 2011-02-25 ENCOUNTER — Other Ambulatory Visit: Payer: Self-pay | Admitting: Oncology

## 2011-02-25 ENCOUNTER — Encounter (HOSPITAL_BASED_OUTPATIENT_CLINIC_OR_DEPARTMENT_OTHER): Payer: BC Managed Care – PPO | Admitting: Oncology

## 2011-02-25 DIAGNOSIS — C184 Malignant neoplasm of transverse colon: Secondary | ICD-10-CM

## 2011-02-25 DIAGNOSIS — R599 Enlarged lymph nodes, unspecified: Secondary | ICD-10-CM

## 2011-02-25 DIAGNOSIS — C182 Malignant neoplasm of ascending colon: Secondary | ICD-10-CM

## 2011-02-25 DIAGNOSIS — Z5111 Encounter for antineoplastic chemotherapy: Secondary | ICD-10-CM

## 2011-02-25 DIAGNOSIS — C189 Malignant neoplasm of colon, unspecified: Secondary | ICD-10-CM

## 2011-02-25 LAB — COMPREHENSIVE METABOLIC PANEL
ALT: 165 U/L — ABNORMAL HIGH (ref 0–53)
Alkaline Phosphatase: 67 U/L (ref 39–117)
CO2: 29 mEq/L (ref 19–32)
Creatinine, Ser: 0.83 mg/dL (ref 0.50–1.35)
Glucose, Bld: 107 mg/dL — ABNORMAL HIGH (ref 70–99)
Sodium: 136 mEq/L (ref 135–145)
Total Bilirubin: 0.2 mg/dL — ABNORMAL LOW (ref 0.3–1.2)
Total Protein: 6.9 g/dL (ref 6.0–8.3)

## 2011-02-25 LAB — CBC WITH DIFFERENTIAL/PLATELET
BASO%: 1.3 % (ref 0.0–2.0)
HCT: 36.5 % — ABNORMAL LOW (ref 38.4–49.9)
LYMPH%: 33.3 % (ref 14.0–49.0)
MCHC: 33.4 g/dL (ref 32.0–36.0)
MCV: 80.9 fL (ref 79.3–98.0)
MONO#: 0.6 10*3/uL (ref 0.1–0.9)
MONO%: 15.5 % — ABNORMAL HIGH (ref 0.0–14.0)
NEUT%: 47.6 % (ref 39.0–75.0)
Platelets: 142 10*3/uL (ref 140–400)
RBC: 4.51 10*6/uL (ref 4.20–5.82)

## 2011-02-27 ENCOUNTER — Encounter (HOSPITAL_BASED_OUTPATIENT_CLINIC_OR_DEPARTMENT_OTHER): Payer: BC Managed Care – PPO | Admitting: Oncology

## 2011-02-27 DIAGNOSIS — C189 Malignant neoplasm of colon, unspecified: Secondary | ICD-10-CM

## 2011-02-27 DIAGNOSIS — Z452 Encounter for adjustment and management of vascular access device: Secondary | ICD-10-CM

## 2011-03-11 ENCOUNTER — Other Ambulatory Visit: Payer: Self-pay | Admitting: Oncology

## 2011-03-11 ENCOUNTER — Encounter (HOSPITAL_BASED_OUTPATIENT_CLINIC_OR_DEPARTMENT_OTHER): Payer: BC Managed Care – PPO | Admitting: Oncology

## 2011-03-11 DIAGNOSIS — Z5111 Encounter for antineoplastic chemotherapy: Secondary | ICD-10-CM

## 2011-03-11 DIAGNOSIS — I824Z9 Acute embolism and thrombosis of unspecified deep veins of unspecified distal lower extremity: Secondary | ICD-10-CM

## 2011-03-11 DIAGNOSIS — C189 Malignant neoplasm of colon, unspecified: Secondary | ICD-10-CM

## 2011-03-11 DIAGNOSIS — C182 Malignant neoplasm of ascending colon: Secondary | ICD-10-CM

## 2011-03-11 DIAGNOSIS — D709 Neutropenia, unspecified: Secondary | ICD-10-CM

## 2011-03-11 DIAGNOSIS — R599 Enlarged lymph nodes, unspecified: Secondary | ICD-10-CM

## 2011-03-11 DIAGNOSIS — C184 Malignant neoplasm of transverse colon: Secondary | ICD-10-CM

## 2011-03-11 LAB — CBC WITH DIFFERENTIAL/PLATELET
Eosinophils Absolute: 0.1 10*3/uL (ref 0.0–0.5)
HCT: 35.2 % — ABNORMAL LOW (ref 38.4–49.9)
LYMPH%: 36.8 % (ref 14.0–49.0)
MCV: 82.8 fL (ref 79.3–98.0)
MONO#: 0.4 10*3/uL (ref 0.1–0.9)
MONO%: 13.4 % (ref 0.0–14.0)
NEUT#: 1.4 10*3/uL — ABNORMAL LOW (ref 1.5–6.5)
NEUT%: 46.8 % (ref 39.0–75.0)
Platelets: 124 10*3/uL — ABNORMAL LOW (ref 140–400)
WBC: 3 10*3/uL — ABNORMAL LOW (ref 4.0–10.3)

## 2011-03-11 LAB — COMPREHENSIVE METABOLIC PANEL
BUN: 8 mg/dL (ref 6–23)
CO2: 26 mEq/L (ref 19–32)
Calcium: 9.4 mg/dL (ref 8.4–10.5)
Chloride: 103 mEq/L (ref 96–112)
Creatinine, Ser: 0.95 mg/dL (ref 0.50–1.35)
Glucose, Bld: 102 mg/dL — ABNORMAL HIGH (ref 70–99)
Total Bilirubin: 0.2 mg/dL — ABNORMAL LOW (ref 0.3–1.2)

## 2011-03-13 ENCOUNTER — Encounter (HOSPITAL_BASED_OUTPATIENT_CLINIC_OR_DEPARTMENT_OTHER): Payer: BC Managed Care – PPO | Admitting: Oncology

## 2011-03-13 DIAGNOSIS — C189 Malignant neoplasm of colon, unspecified: Secondary | ICD-10-CM

## 2011-03-13 DIAGNOSIS — Z5189 Encounter for other specified aftercare: Secondary | ICD-10-CM

## 2011-03-15 ENCOUNTER — Other Ambulatory Visit: Payer: Self-pay | Admitting: Oncology

## 2011-03-15 DIAGNOSIS — C189 Malignant neoplasm of colon, unspecified: Secondary | ICD-10-CM

## 2011-03-15 DIAGNOSIS — Z1507 Genetic susceptibility to malignant neoplasm of urinary tract: Secondary | ICD-10-CM | POA: Insufficient documentation

## 2011-03-15 DIAGNOSIS — Z1509 Genetic susceptibility to other malignant neoplasm: Secondary | ICD-10-CM | POA: Insufficient documentation

## 2011-03-15 DIAGNOSIS — I82409 Acute embolism and thrombosis of unspecified deep veins of unspecified lower extremity: Secondary | ICD-10-CM

## 2011-03-18 ENCOUNTER — Encounter: Payer: Self-pay | Admitting: *Deleted

## 2011-03-23 ENCOUNTER — Ambulatory Visit (HOSPITAL_COMMUNITY)
Admission: RE | Admit: 2011-03-23 | Discharge: 2011-03-23 | Disposition: A | Discharge status: Home / Self Care | Payer: BC Managed Care – PPO | Source: Ambulatory Visit | Attending: Oncology | Admitting: Oncology

## 2011-03-23 DIAGNOSIS — C189 Malignant neoplasm of colon, unspecified: Secondary | ICD-10-CM

## 2011-03-23 DIAGNOSIS — R599 Enlarged lymph nodes, unspecified: Secondary | ICD-10-CM | POA: Insufficient documentation

## 2011-03-23 DIAGNOSIS — Z9049 Acquired absence of other specified parts of digestive tract: Secondary | ICD-10-CM | POA: Insufficient documentation

## 2011-03-23 MED ORDER — IOHEXOL 300 MG/ML  SOLN
100.0000 mL | Freq: Once | INTRAMUSCULAR | Status: AC | PRN
  Administered 2011-03-23: 100 mL via INTRAVENOUS

## 2011-03-25 ENCOUNTER — Ambulatory Visit (HOSPITAL_BASED_OUTPATIENT_CLINIC_OR_DEPARTMENT_OTHER): Payer: BC Managed Care – PPO | Admitting: Oncology

## 2011-03-25 ENCOUNTER — Other Ambulatory Visit: Payer: Self-pay | Admitting: Oncology

## 2011-03-25 ENCOUNTER — Other Ambulatory Visit (HOSPITAL_BASED_OUTPATIENT_CLINIC_OR_DEPARTMENT_OTHER): Payer: BC Managed Care – PPO | Admitting: Lab

## 2011-03-25 ENCOUNTER — Ambulatory Visit (HOSPITAL_BASED_OUTPATIENT_CLINIC_OR_DEPARTMENT_OTHER): Payer: BC Managed Care – PPO

## 2011-03-25 VITALS — BP 131/74 | HR 71 | Temp 98.6°F | Ht 69.5 in | Wt 159.5 lb

## 2011-03-25 DIAGNOSIS — Z5111 Encounter for antineoplastic chemotherapy: Secondary | ICD-10-CM

## 2011-03-25 DIAGNOSIS — C189 Malignant neoplasm of colon, unspecified: Secondary | ICD-10-CM

## 2011-03-25 LAB — COMPREHENSIVE METABOLIC PANEL
AST: 62 U/L — ABNORMAL HIGH (ref 0–37)
Albumin: 3.4 g/dL — ABNORMAL LOW (ref 3.5–5.2)
Alkaline Phosphatase: 117 U/L (ref 39–117)
Glucose, Bld: 96 mg/dL (ref 70–99)
Potassium: 4 mEq/L (ref 3.5–5.3)
Sodium: 138 mEq/L (ref 135–145)
Total Protein: 6.7 g/dL (ref 6.0–8.3)

## 2011-03-25 LAB — CBC WITH DIFFERENTIAL/PLATELET
EOS%: 1 % (ref 0.0–7.0)
Eosinophils Absolute: 0.1 10*3/uL (ref 0.0–0.5)
MCV: 85 fL (ref 79.3–98.0)
MONO%: 8.4 % (ref 0.0–14.0)
NEUT#: 8.3 10*3/uL — ABNORMAL HIGH (ref 1.5–6.5)
RBC: 4.2 10*6/uL (ref 4.20–5.82)
RDW: 21.7 % — ABNORMAL HIGH (ref 11.0–14.6)
lymph#: 1.5 10*3/uL (ref 0.9–3.3)

## 2011-03-25 MED ORDER — DEXAMETHASONE SODIUM PHOSPHATE 10 MG/ML IJ SOLN
10.0000 mg | Freq: Once | INTRAMUSCULAR | Status: AC
  Administered 2011-03-25: 10 mg via INTRAVENOUS

## 2011-03-25 MED ORDER — FLUOROURACIL CHEMO INJECTION 2.5 GM/50ML
400.0000 mg/m2 | Freq: Once | INTRAVENOUS | Status: AC
  Administered 2011-03-25: 750 mg via INTRAVENOUS
  Filled 2011-03-25: qty 15

## 2011-03-25 MED ORDER — DEXTROSE 5 % IV SOLN
85.0000 mg/m2 | Freq: Once | INTRAVENOUS | Status: AC
  Administered 2011-03-25: 160 mg via INTRAVENOUS
  Filled 2011-03-25: qty 32

## 2011-03-25 MED ORDER — ONDANSETRON 8 MG/50ML IVPB (CHCC)
8.0000 mg | Freq: Once | INTRAVENOUS | Status: AC
  Administered 2011-03-25: 8 mg via INTRAVENOUS

## 2011-03-25 MED ORDER — LEUCOVORIN CALCIUM INJECTION 350 MG
400.0000 mg/m2 | Freq: Once | INTRAMUSCULAR | Status: AC
  Administered 2011-03-25: 744 mg via INTRAVENOUS
  Filled 2011-03-25: qty 37.2

## 2011-03-25 MED ORDER — SODIUM CHLORIDE 0.9 % IV SOLN
2400.0000 mg/m2 | INTRAVENOUS | Status: DC
  Administered 2011-03-25: 4450 mg via INTRAVENOUS
  Filled 2011-03-25: qty 89

## 2011-03-25 NOTE — Progress Notes (Signed)
OFFICE PROGRESS NOTE   INTERVAL HISTORY:   Terry Vargas returns as scheduled. He completed cycle #6 of FOLFOX on October 24. He tolerated the chemotherapy well. He reports cold sensitivity lasting 4-5 days after chemotherapy. This has resolved. He denies neuropathy symptoms today. He continues to work and exercise. He continues daily Lovenox. He started daily aspirin therapy.    Objective: Vital signs in last 24 hours:  Blood pressure 131/74, pulse 71, temperature 98.6 F (37 C), temperature source Oral, height 5' 9.5" (1.765 m), weight 159 lb 8 oz (72.349 kg).    HEENT: No thrush or ulcers. Resp: Lungs clear bilaterally Cardio: Regular rate and rhythm GI: Abdomen soft and nontender Vascular: Trace to 1+ edema at the left lower leg. Neuro: The vibratory sense is intact at the fingertip bilaterally.    Portacath/PICC-without erythema  Lab Results:   Basename 03/25/11 0910  WBC --  HGB 12.1*  HCT 35.7*  PLT 94*     BMET   Basename 03/25/11 0909  NA 138  K 4.0  CL 104  CO2 25  GLUCOSE 96  BUN 19  CREATININE 1.00  CALCIUM 9.1    Studies/Results:  Ct Abdomen Pelvis W Contrast  03/23/2011  *RADIOLOGY REPORT*  Clinical Data: Colon cancer, chemotherapy in progress.  Partial colectomy.  CT ABDOMEN AND PELVIS WITH CONTRAST  Technique:  Multidetector CT imaging of the abdomen and pelvis was performed following the standard protocol during bolus administration of intravenous contrast.  Contrast: OMNIPAQUE IOHEXOL 300 MG/ML IV SOLN  Comparison: PET CT 12/29/2010, CT 11/11/1998  Findings: Lung bases are clear.  No pericardial fluid.  No focal hepatic lesion.  The gallbladder, pancreas, spleen, adrenal glands, kidneys are normal.  The stomach, small bowel, and colon are unchanged.  Postsurgical anatomy consistent with right hemicolectomy.  There is no evidence of obstruction or nodularity at the anastomoses in the mid transverse colon.  There is interval decrease in size of  high periaortic adenopathy. For example 8 mm left periaortic lymph node decreased in 10 mm on prior.  A 6 mm lymph node left adjacent to the SMA (image 26) is decreased from 18 mm on prior.  Aortocaval node measures 6 mm decreased from 10 mm on prior.  No new adenopathy.  Abdominal aorta is normal caliber.  No free fluid the pelvis.  The bladder and prostate gland are normal.  No pelvic lymphadenopathy.  Review of  bone windows demonstrates no aggressive osseous lesions.  IMPRESSION:  1.  Reduction in periaortic lymphadenopathy. 2.  No evidence of new adenopathy. 3.  No evidence of metastasis to the liver. 4.  Stable postsurgical change consistent with the right hemicolectomy.  Original Report Authenticated By: Genevive Bi, M.D.    Medications: I have reviewed the patient's current medications.  Assessment/Plan:  #1-stage III (T4, N2) adenocarcinoma of the right colon, status post a right colectomy 11/25/2010.  -He began adjuvant FOLFOX chemotherapy 12/31/2010. He has completed 6 cycles to date.  #2-PET scan 12/29/2010 with hypermetabolic retroperitoneal and anterior abdominal adenopathy, postoperative changes of the right colectomy with a possible seroma in the anterior abdomen. Patchy activity was noted in the parotid glands, potentially inflammatory in etiology.  -The hypermetabolic retroperitoneal lymph nodes potentially represented metastatic disease. We presented her case at the GI tumor conference and discussed the case with Dr. Dwain Sarna. A decision was made to proceed with "adjuvant" therapy with the plan for a restaging CT at a 3 to four-month interval.  -The restaging CT on  03/23/2011 reveal a decrease in the periaortic lymphadenopathy, no new adenopathy, and no evidence of liver metastases. This suggests the small retroperitoneal lymph nodes may represent metastatic colon cancer versus resolving "inflammatory" nodes.  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  #8-mild neutropenia on 03/11/2011, he received Neulasta following cycle #6 of FOLFOX. The neutrophil count is normal today.  #9-thrombocytopenia secondary to chemotherapy, we decided to proceed with chemotherapy today. He knows to contact us for bruising or bleeding.  Disposition:  Terry Vargas has completed 6 cycles of FOLFOX chemotherapy. He continues to tolerate the chemotherapy well. The plan is to proceed with cycle #7 today.  We discussed the restaging CT. The decrease in the abdominal/retroperitoneal lymphadenopathy may represent improved metastatic disease versus resolving "inflammatory" lymph nodes.  Mr. vibratory returned for an office visit in the next cycle of chemotherapy in 2 weeks.   Alim Cattell BRADLEY 03/25/2011, 2:40 PM

## 2011-03-26 ENCOUNTER — Other Ambulatory Visit: Payer: Self-pay | Admitting: Oncology

## 2011-03-27 ENCOUNTER — Ambulatory Visit (HOSPITAL_BASED_OUTPATIENT_CLINIC_OR_DEPARTMENT_OTHER): Payer: BC Managed Care – PPO

## 2011-03-27 VITALS — BP 126/73 | Temp 98.1°F

## 2011-03-27 DIAGNOSIS — C189 Malignant neoplasm of colon, unspecified: Secondary | ICD-10-CM

## 2011-03-27 MED ORDER — HEPARIN SOD (PORK) LOCK FLUSH 100 UNIT/ML IV SOLN
500.0000 [IU] | Freq: Once | INTRAVENOUS | Status: AC | PRN
  Administered 2011-03-27: 500 [IU]
  Filled 2011-03-27: qty 5

## 2011-03-27 MED ORDER — SODIUM CHLORIDE 0.9 % IJ SOLN
10.0000 mL | INTRAMUSCULAR | Status: DC | PRN
  Administered 2011-03-27: 10 mL
  Filled 2011-03-27: qty 10

## 2011-04-07 ENCOUNTER — Other Ambulatory Visit: Payer: Self-pay | Admitting: Oncology

## 2011-04-08 ENCOUNTER — Other Ambulatory Visit (HOSPITAL_BASED_OUTPATIENT_CLINIC_OR_DEPARTMENT_OTHER): Payer: BC Managed Care – PPO | Admitting: Lab

## 2011-04-08 ENCOUNTER — Other Ambulatory Visit: Payer: Self-pay | Admitting: Oncology

## 2011-04-08 ENCOUNTER — Ambulatory Visit (HOSPITAL_BASED_OUTPATIENT_CLINIC_OR_DEPARTMENT_OTHER): Payer: BC Managed Care – PPO

## 2011-04-08 ENCOUNTER — Ambulatory Visit (HOSPITAL_BASED_OUTPATIENT_CLINIC_OR_DEPARTMENT_OTHER): Payer: BC Managed Care – PPO | Admitting: Oncology

## 2011-04-08 VITALS — BP 130/73 | HR 72 | Temp 98.2°F | Ht 69.5 in | Wt 160.1 lb

## 2011-04-08 DIAGNOSIS — C189 Malignant neoplasm of colon, unspecified: Secondary | ICD-10-CM

## 2011-04-08 DIAGNOSIS — Z5111 Encounter for antineoplastic chemotherapy: Secondary | ICD-10-CM

## 2011-04-08 DIAGNOSIS — C269 Malignant neoplasm of ill-defined sites within the digestive system: Secondary | ICD-10-CM

## 2011-04-08 DIAGNOSIS — Z86718 Personal history of other venous thrombosis and embolism: Secondary | ICD-10-CM

## 2011-04-08 LAB — CBC WITH DIFFERENTIAL/PLATELET
EOS%: 3.2 % (ref 0.0–7.0)
MCH: 29.6 pg (ref 27.2–33.4)
MCV: 86.7 fL (ref 79.3–98.0)
MONO%: 14.9 % — ABNORMAL HIGH (ref 0.0–14.0)
RBC: 3.98 10*6/uL — ABNORMAL LOW (ref 4.20–5.82)
RDW: 20 % — ABNORMAL HIGH (ref 11.0–14.6)

## 2011-04-08 LAB — COMPREHENSIVE METABOLIC PANEL
AST: 45 U/L — ABNORMAL HIGH (ref 0–37)
Albumin: 3.3 g/dL — ABNORMAL LOW (ref 3.5–5.2)
Alkaline Phosphatase: 74 U/L (ref 39–117)
BUN: 14 mg/dL (ref 6–23)
Potassium: 4 mEq/L (ref 3.5–5.3)
Sodium: 138 mEq/L (ref 135–145)
Total Protein: 6.4 g/dL (ref 6.0–8.3)

## 2011-04-08 MED ORDER — HEPARIN SOD (PORK) LOCK FLUSH 100 UNIT/ML IV SOLN
500.0000 [IU] | Freq: Once | INTRAVENOUS | Status: DC | PRN
  Filled 2011-04-08: qty 5

## 2011-04-08 MED ORDER — SODIUM CHLORIDE 0.9 % IJ SOLN
10.0000 mL | INTRAMUSCULAR | Status: DC | PRN
  Filled 2011-04-08: qty 10

## 2011-04-08 MED ORDER — DEXTROSE 5 % IV SOLN
400.0000 mg/m2 | Freq: Once | INTRAVENOUS | Status: AC
  Administered 2011-04-08: 744 mg via INTRAVENOUS
  Filled 2011-04-08: qty 37.2

## 2011-04-08 MED ORDER — ONDANSETRON 8 MG/50ML IVPB (CHCC)
8.0000 mg | Freq: Once | INTRAVENOUS | Status: AC
  Administered 2011-04-08: 8 mg via INTRAVENOUS

## 2011-04-08 MED ORDER — DEXAMETHASONE SODIUM PHOSPHATE 10 MG/ML IJ SOLN
10.0000 mg | Freq: Once | INTRAMUSCULAR | Status: AC
  Administered 2011-04-08: 10 mg via INTRAVENOUS

## 2011-04-08 MED ORDER — OXALIPLATIN CHEMO INJECTION 100 MG/20ML
85.0000 mg/m2 | Freq: Once | INTRAVENOUS | Status: AC
  Administered 2011-04-08: 160 mg via INTRAVENOUS
  Filled 2011-04-08: qty 32

## 2011-04-08 MED ORDER — SODIUM CHLORIDE 0.9 % IV SOLN
2400.0000 mg/m2 | INTRAVENOUS | Status: DC
  Administered 2011-04-08: 4450 mg via INTRAVENOUS
  Filled 2011-04-08: qty 89

## 2011-04-08 MED ORDER — DEXTROSE 5 % IV SOLN
Freq: Once | INTRAVENOUS | Status: AC
  Administered 2011-04-08: 11:00:00 via INTRAVENOUS

## 2011-04-08 MED ORDER — FLUOROURACIL CHEMO INJECTION 2.5 GM/50ML
400.0000 mg/m2 | Freq: Once | INTRAVENOUS | Status: AC
  Administered 2011-04-08: 750 mg via INTRAVENOUS
  Filled 2011-04-08: qty 15

## 2011-04-08 NOTE — Patient Instructions (Signed)
[facility]cer Center Discharge Instructions for Patients Receiving Chemotherapy  Today you received the following chemotherapy agents oxaliplatin, leucovorin, fluorouracil (5FU)  To help prevent nausea and vomiting after your treatment, we encourage you to take your nausea medication as prescribed Begin taking it at 5pm and take it as often as prescribed for the next 72 hours if needed.   If you develop nausea and vomiting that is not controlled by your nausea medication, call the clinic. If it is after clinic hours your family physician or the after hours number for the clinic or go to the Emergency Department.   BELOW ARE SYMPTOMS THAT SHOULD BE REPORTED IMMEDIATELY:  *FEVER GREATER THAN 101.0 F  *CHILLS WITH OR WITHOUT FEVER  NAUSEA AND VOMITING THAT IS NOT CONTROLLED WITH YOUR NAUSEA MEDICATION  *UNUSUAL SHORTNESS OF BREATH  *UNUSUAL BRUISING OR BLEEDING  TENDERNESS IN MOUTH AND THROAT WITH OR WITHOUT PRESENCE OF ULCERS  *URINARY PROBLEMS  *BOWEL PROBLEMS  UNUSUAL RASH Items with * indicate a potential emergency and should be followed up as soon as possible.  One of the nurses will contact you 24 hours after your treatment. Please let the nurse know about any problems that you may have experienced. Feel free to call the clinic you have any questions or concerns. The clinic phone number is [phone].   I have been informed and understand all the instructions given to me. I know to contact the clinic, my physician, or go to the Emergency Department if any problems should occur. I do not have any questions at this time, but understand that I may call the clinic during office hours or the Patient Navigator at [phone] should I have any questions or need assistance in obtaining follow up care.    __________________________________________  _____________  __________ Signature of Patient or Authorized Representative            Date                    Time    __________________________________________ Nurse's Signature

## 2011-04-08 NOTE — Progress Notes (Signed)
OFFICE PROGRESS NOTE   INTERVAL HISTORY:   Terry Vargas returns as scheduled. He completed cycle #7 of FOLFOX on November 7. He tolerated the chemotherapy well. He reports cold sensitivity . He continues to work and exercise. He continues daily Lovenox. He has no other neuropathy symptoms  Objective: Vital signs in last 24 hours:  Blood pressure 130/73, pulse 72, temperature 98.2 F (36.8 C), temperature source Oral, height 5' 9.5" (1.765 m), weight 160 lb 1.6 oz (72.621 kg).    HEENT: No thrush or ulcers. Resp: Lungs clear bilaterally Cardio: Regular rate and rhythm GI: Abdomen soft and nontender Vascular: Trace to 1+ edema at the left lower leg. Neuro: The vibratory sense is intact at the fingertip bilaterally.    Portacath/PICC-without erythema  Lab Results:   Basename 04/08/11 0935  WBC 2.8*  HGB 11.8*  HCT 34.5*  PLT 140    Medications: I have reviewed the patient's current medications.  Assessment/Plan:  #1-stage III (T4, N2) adenocarcinoma of the right colon, status post a right colectomy 11/25/2010.  -He began adjuvant FOLFOX chemotherapy 12/31/2010. He has completed 7 cycles to date.  #2-PET scan 12/29/2010 with hypermetabolic retroperitoneal and anterior abdominal adenopathy, postoperative changes of the right colectomy with a possible seroma in the anterior abdomen. Patchy activity was noted in the parotid glands, potentially inflammatory in etiology.  -The hypermetabolic retroperitoneal lymph nodes potentially represented metastatic disease. We presented her case at the GI tumor conference and discussed the case with Dr. Dwain Sarna. A decision was made to proceed with "adjuvant" therapy with the plan for a restaging CT at a 3 to four-month interval.  -The restaging CT on 03/23/2011 reveal a decrease in the periaortic lymphadenopathy, no new adenopathy, and no evidence of liver metastases. This suggests the small retroperitoneal lymph nodes may represent metastatic  colon cancer versus resolving "inflammatory" nodes.  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  #8-mild neutropenia on 03/11/2011, he received Neulasta following cycle #6 of FOLFOX. The neutrophil is mildly low today. He will receive Neulasta support following cycle 8.  #9-thrombocytopenia secondary to chemotherapy, improved.  Disposition:  Terry Vargas has completed 7 cycles of FOLFOX chemotherapy. He continues to tolerate the chemotherapy well. The plan is to proceed with cycle 8 today.    Terry Vargas will return for an office visit and the next cycle of chemotherapy in 2 weeks.   Terry Vargas BRADLEY 04/08/2011, 11:04 AM

## 2011-04-10 ENCOUNTER — Ambulatory Visit (HOSPITAL_BASED_OUTPATIENT_CLINIC_OR_DEPARTMENT_OTHER): Payer: BC Managed Care – PPO

## 2011-04-10 VITALS — BP 128/75 | HR 59 | Temp 97.4°F

## 2011-04-10 DIAGNOSIS — C189 Malignant neoplasm of colon, unspecified: Secondary | ICD-10-CM

## 2011-04-10 DIAGNOSIS — Z5189 Encounter for other specified aftercare: Secondary | ICD-10-CM

## 2011-04-10 MED ORDER — PEGFILGRASTIM INJECTION 6 MG/0.6ML
6.0000 mg | Freq: Once | SUBCUTANEOUS | Status: AC
  Administered 2011-04-10: 6 mg via SUBCUTANEOUS
  Filled 2011-04-10: qty 0.6

## 2011-04-10 MED ORDER — HEPARIN SOD (PORK) LOCK FLUSH 100 UNIT/ML IV SOLN
500.0000 [IU] | Freq: Once | INTRAVENOUS | Status: AC | PRN
  Administered 2011-04-10: 500 [IU]
  Filled 2011-04-10: qty 5

## 2011-04-10 MED ORDER — SODIUM CHLORIDE 0.9 % IJ SOLN
10.0000 mL | INTRAMUSCULAR | Status: DC | PRN
  Administered 2011-04-10: 10 mL
  Filled 2011-04-10: qty 10

## 2011-04-22 ENCOUNTER — Ambulatory Visit (HOSPITAL_BASED_OUTPATIENT_CLINIC_OR_DEPARTMENT_OTHER): Payer: BC Managed Care – PPO

## 2011-04-22 ENCOUNTER — Other Ambulatory Visit: Payer: Self-pay | Admitting: Oncology

## 2011-04-22 ENCOUNTER — Telehealth: Payer: Self-pay | Admitting: *Deleted

## 2011-04-22 ENCOUNTER — Other Ambulatory Visit (HOSPITAL_BASED_OUTPATIENT_CLINIC_OR_DEPARTMENT_OTHER): Payer: BC Managed Care – PPO | Admitting: Lab

## 2011-04-22 ENCOUNTER — Ambulatory Visit (HOSPITAL_BASED_OUTPATIENT_CLINIC_OR_DEPARTMENT_OTHER): Payer: BC Managed Care – PPO | Admitting: Nurse Practitioner

## 2011-04-22 VITALS — BP 130/78 | HR 69 | Temp 98.5°F | Ht 69.5 in | Wt 160.1 lb

## 2011-04-22 DIAGNOSIS — Z5111 Encounter for antineoplastic chemotherapy: Secondary | ICD-10-CM

## 2011-04-22 DIAGNOSIS — C189 Malignant neoplasm of colon, unspecified: Secondary | ICD-10-CM

## 2011-04-22 DIAGNOSIS — C482 Malignant neoplasm of peritoneum, unspecified: Secondary | ICD-10-CM

## 2011-04-22 DIAGNOSIS — Z86718 Personal history of other venous thrombosis and embolism: Secondary | ICD-10-CM

## 2011-04-22 DIAGNOSIS — Z7901 Long term (current) use of anticoagulants: Secondary | ICD-10-CM

## 2011-04-22 LAB — CBC WITH DIFFERENTIAL/PLATELET
Basophils Absolute: 0 10*3/uL (ref 0.0–0.1)
EOS%: 1.5 % (ref 0.0–7.0)
HGB: 12.5 g/dL — ABNORMAL LOW (ref 13.0–17.1)
LYMPH%: 11.7 % — ABNORMAL LOW (ref 14.0–49.0)
MCH: 30.9 pg (ref 27.2–33.4)
MCV: 90.1 fL (ref 79.3–98.0)
MONO%: 7.8 % (ref 0.0–14.0)
RDW: 18.6 % — ABNORMAL HIGH (ref 11.0–14.6)

## 2011-04-22 MED ORDER — SODIUM CHLORIDE 0.9 % IV SOLN
2400.0000 mg/m2 | INTRAVENOUS | Status: DC
  Administered 2011-04-22: 4450 mg via INTRAVENOUS
  Filled 2011-04-22: qty 89

## 2011-04-22 MED ORDER — OXALIPLATIN CHEMO INJECTION 100 MG/20ML
85.0000 mg/m2 | Freq: Once | INTRAVENOUS | Status: AC
  Administered 2011-04-22: 160 mg via INTRAVENOUS
  Filled 2011-04-22: qty 32

## 2011-04-22 MED ORDER — HEPARIN SOD (PORK) LOCK FLUSH 100 UNIT/ML IV SOLN
500.0000 [IU] | Freq: Once | INTRAVENOUS | Status: DC | PRN
  Filled 2011-04-22: qty 5

## 2011-04-22 MED ORDER — LEUCOVORIN CALCIUM INJECTION 350 MG
400.0000 mg/m2 | Freq: Once | INTRAVENOUS | Status: AC
  Administered 2011-04-22: 744 mg via INTRAVENOUS
  Filled 2011-04-22: qty 37.2

## 2011-04-22 MED ORDER — DEXAMETHASONE SODIUM PHOSPHATE 10 MG/ML IJ SOLN
10.0000 mg | Freq: Once | INTRAMUSCULAR | Status: AC
  Administered 2011-04-22: 10 mg via INTRAVENOUS

## 2011-04-22 MED ORDER — ONDANSETRON 8 MG/50ML IVPB (CHCC)
8.0000 mg | Freq: Once | INTRAVENOUS | Status: AC
  Administered 2011-04-22: 8 mg via INTRAVENOUS

## 2011-04-22 MED ORDER — DEXTROSE 5 % IV SOLN
Freq: Once | INTRAVENOUS | Status: AC
  Administered 2011-04-22: 12:00:00 via INTRAVENOUS

## 2011-04-22 MED ORDER — SODIUM CHLORIDE 0.9 % IJ SOLN
10.0000 mL | INTRAMUSCULAR | Status: DC | PRN
  Filled 2011-04-22: qty 10

## 2011-04-22 MED ORDER — FLUOROURACIL CHEMO INJECTION 2.5 GM/50ML
400.0000 mg/m2 | Freq: Once | INTRAVENOUS | Status: AC
  Administered 2011-04-22: 750 mg via INTRAVENOUS
  Filled 2011-04-22: qty 15

## 2011-04-22 NOTE — Telephone Encounter (Signed)
Wife called asking if office will approve 90 day supply of Lovenox 100 mg?

## 2011-04-22 NOTE — Progress Notes (Signed)
OFFICE PROGRESS NOTE  Interval history:  Terry Vargas returns as scheduled. He completed cycle #8 FOLFOX 04/08/2011. He received Neulasta support. He feels well. No nausea or vomiting. No mouth sores. No diarrhea. No hand or foot pain or redness. He notes intermittent mild cold sensitivity. He denies neuropathy symptoms in the absence of cold exposure.   Objective: Blood pressure 130/78, pulse 69, temperature 98.5 F (36.9 C), temperature source Oral, height 5' 9.5" (1.765 m), weight 160 lb 1.6 oz (72.621 kg).  Oropharynx is without thrush or ulceration. Lungs are clear. Regular cardiac rhythm. Port-A-Cath site is without erythema. Abdomen is soft and nontender. No hepatomegaly. Extremities are without edema. Vibratory sense is intact over the fingertips per tuning fork exam.   Lab Results: Lab Results  Component Value Date   WBC 13.0* 04/22/2011   HGB 12.5* 04/22/2011   HCT 36.4* 04/22/2011   MCV 90.1 04/22/2011   PLT 95* 04/22/2011    Chemistry:      Component Value Date/Time   NA 138 04/08/2011 0934   K 4.0 04/08/2011 0934   CL 103 04/08/2011 0934   CO2 28 04/08/2011 0934   GLUCOSE 115* 04/08/2011 0934   BUN 14 04/08/2011 0934   CREATININE 1.03 04/08/2011 0934   CALCIUM 9.2 04/08/2011 0934   PROT 6.4 04/08/2011 0934   ALBUMIN 3.3* 04/08/2011 0934   AST 45* 04/08/2011 0934   ALT 50 04/08/2011 0934   ALKPHOS 74 04/08/2011 0934   BILITOT 0.2* 04/08/2011 0934   GFRNONAA >60 12/24/2010 1400   GFRAA >60 12/24/2010 1400     Studies/Results: Ct Abdomen Pelvis W Contrast  03/23/2011  *RADIOLOGY REPORT*  Clinical Data: Colon cancer, chemotherapy in progress.  Partial colectomy.  CT ABDOMEN AND PELVIS WITH CONTRAST  Technique:  Multidetector CT imaging of the abdomen and pelvis was performed following the standard protocol during bolus administration of intravenous contrast.  Contrast: OMNIPAQUE IOHEXOL 300 MG/ML IV SOLN  Comparison: PET CT 12/29/2010, CT 11/11/1998  Findings: Lung  bases are clear.  No pericardial fluid.  No focal hepatic lesion.  The gallbladder, pancreas, spleen, adrenal glands, kidneys are normal.  The stomach, small bowel, and colon are unchanged.  Postsurgical anatomy consistent with right hemicolectomy.  There is no evidence of obstruction or nodularity at the anastomoses in the mid transverse colon.  There is interval decrease in size of high periaortic adenopathy. For example 8 mm left periaortic lymph node decreased in 10 mm on prior.  A 6 mm lymph node left adjacent to the SMA (image 26) is decreased from 18 mm on prior.  Aortocaval node measures 6 mm decreased from 10 mm on prior.  No new adenopathy.  Abdominal aorta is normal caliber.  No free fluid the pelvis.  The bladder and prostate gland are normal.  No pelvic lymphadenopathy.  Review of  bone windows demonstrates no aggressive osseous lesions.  IMPRESSION:  1.  Reduction in periaortic lymphadenopathy. 2.  No evidence of new adenopathy. 3.  No evidence of metastasis to the liver. 4.  Stable postsurgical change consistent with the right hemicolectomy.  Original Report Authenticated By: Genevive Bi, M.D.     Assessment/Plan:  #1-Stage III (T4, N2) adenocarcinoma of the right colon, status post a right colectomy 11/25/2010.  He began adjuvant FOLFOX chemotherapy 12/31/2010. He has completed 8 cycles to date.   #2-PET scan 12/29/2010 with hypermetabolic retroperitoneal and anterior abdominal adenopathy, postoperative changes of the right colectomy with a possible seroma in the anterior abdomen. Patchy  activity was noted in the parotid glands, potentially inflammatory in etiology.  -The hypermetabolic retroperitoneal lymph nodes potentially represented metastatic disease. We presented his case at the GI tumor conference and discussed the case with Dr. Dwain Sarna. A decision was made to proceed with "adjuvant" therapy with the plan for a restaging CT at a 3 to four-month interval.  -The restaging CT on  03/23/2011 revealed a decrease in the periaortic lymphadenopathy, no new adenopathy, and no evidence of liver metastases. This suggests the small retroperitoneal lymph nodes may represent metastatic colon cancer versus resolving "inflammatory" nodes. Restaging CT evaluation is planned 3-4 months following completion of adjuvant therapy.  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   #6-Left leg deep vein thrombosis confirmed on a Doppler ultrasound 01/28/2011, maintained on daily Lovenox.   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   #8-Mild neutropenia on 03/11/2011, he received Neulasta following cycle #6 of FOLFOX. He also received Neulasta support following cycle 8. The white count is mildly elevated today. He will not receive Neulasta support with cycle 9.   #9-Thrombocytopenia secondary to chemotherapy.  Disposition-Terry Vargas is stable. Plan to proceed with cycle 9 of FOLFOX today as scheduled. As noted above he will not receive Neulasta support with cycle 9. He will return for a followup visit and cycle 10 of FOLFOX in 2 weeks. He will contact the office in the interim with any problems.  Plan reviewed with Dr. Truett Perna.    Lonna Cobb ANP/GNP-BC

## 2011-04-23 NOTE — Telephone Encounter (Signed)
90 day supply ok. Just need to find out what pharmacy.

## 2011-04-24 ENCOUNTER — Other Ambulatory Visit: Payer: Self-pay | Admitting: Oncology

## 2011-04-24 ENCOUNTER — Ambulatory Visit (HOSPITAL_BASED_OUTPATIENT_CLINIC_OR_DEPARTMENT_OTHER): Payer: BC Managed Care – PPO

## 2011-04-24 ENCOUNTER — Other Ambulatory Visit: Payer: Self-pay | Admitting: *Deleted

## 2011-04-24 VITALS — BP 118/72 | HR 66 | Temp 97.0°F

## 2011-04-24 DIAGNOSIS — C189 Malignant neoplasm of colon, unspecified: Secondary | ICD-10-CM

## 2011-04-24 MED ORDER — HEPARIN SOD (PORK) LOCK FLUSH 100 UNIT/ML IV SOLN
500.0000 [IU] | Freq: Once | INTRAVENOUS | Status: AC | PRN
  Administered 2011-04-24: 500 [IU]
  Filled 2011-04-24: qty 5

## 2011-04-24 MED ORDER — SODIUM CHLORIDE 0.9 % IJ SOLN
10.0000 mL | INTRAMUSCULAR | Status: DC | PRN
  Administered 2011-04-24: 10 mL
  Filled 2011-04-24: qty 10

## 2011-05-06 ENCOUNTER — Ambulatory Visit (HOSPITAL_BASED_OUTPATIENT_CLINIC_OR_DEPARTMENT_OTHER): Payer: BC Managed Care – PPO | Admitting: Oncology

## 2011-05-06 ENCOUNTER — Other Ambulatory Visit (HOSPITAL_BASED_OUTPATIENT_CLINIC_OR_DEPARTMENT_OTHER): Payer: BC Managed Care – PPO | Admitting: Lab

## 2011-05-06 ENCOUNTER — Ambulatory Visit (HOSPITAL_BASED_OUTPATIENT_CLINIC_OR_DEPARTMENT_OTHER): Payer: BC Managed Care – PPO

## 2011-05-06 ENCOUNTER — Telehealth: Payer: Self-pay | Admitting: Oncology

## 2011-05-06 VITALS — BP 130/73 | HR 76 | Temp 97.5°F | Ht 69.5 in | Wt 163.4 lb

## 2011-05-06 DIAGNOSIS — C189 Malignant neoplasm of colon, unspecified: Secondary | ICD-10-CM

## 2011-05-06 DIAGNOSIS — C269 Malignant neoplasm of ill-defined sites within the digestive system: Secondary | ICD-10-CM

## 2011-05-06 DIAGNOSIS — Z79899 Other long term (current) drug therapy: Secondary | ICD-10-CM

## 2011-05-06 DIAGNOSIS — Z85828 Personal history of other malignant neoplasm of skin: Secondary | ICD-10-CM

## 2011-05-06 DIAGNOSIS — I82409 Acute embolism and thrombosis of unspecified deep veins of unspecified lower extremity: Secondary | ICD-10-CM

## 2011-05-06 DIAGNOSIS — Z5111 Encounter for antineoplastic chemotherapy: Secondary | ICD-10-CM

## 2011-05-06 LAB — CBC WITH DIFFERENTIAL/PLATELET
BASO%: 1 % (ref 0.0–2.0)
EOS%: 4.9 % (ref 0.0–7.0)
HCT: 32.3 % — ABNORMAL LOW (ref 38.4–49.9)
LYMPH%: 23.3 % (ref 14.0–49.0)
MCH: 31.4 pg (ref 27.2–33.4)
MCHC: 34.4 g/dL (ref 32.0–36.0)
NEUT%: 51.4 % (ref 39.0–75.0)
Platelets: 102 10*3/uL — ABNORMAL LOW (ref 140–400)

## 2011-05-06 LAB — COMPREHENSIVE METABOLIC PANEL
AST: 54 U/L — ABNORMAL HIGH (ref 0–37)
Alkaline Phosphatase: 83 U/L (ref 39–117)
BUN: 17 mg/dL (ref 6–23)
Calcium: 9.4 mg/dL (ref 8.4–10.5)
Creatinine, Ser: 0.94 mg/dL (ref 0.50–1.35)

## 2011-05-06 MED ORDER — OXALIPLATIN CHEMO INJECTION 100 MG/20ML
85.0000 mg/m2 | Freq: Once | INTRAVENOUS | Status: DC
  Filled 2011-05-06: qty 32

## 2011-05-06 MED ORDER — LEUCOVORIN CALCIUM INJECTION 350 MG
400.0000 mg/m2 | Freq: Once | INTRAVENOUS | Status: AC
  Administered 2011-05-06: 744 mg via INTRAVENOUS
  Filled 2011-05-06: qty 37.2

## 2011-05-06 MED ORDER — SODIUM CHLORIDE 0.9 % IV SOLN
2400.0000 mg/m2 | INTRAVENOUS | Status: DC
  Administered 2011-05-06: 4450 mg via INTRAVENOUS
  Filled 2011-05-06: qty 89

## 2011-05-06 MED ORDER — FLUOROURACIL CHEMO INJECTION 2.5 GM/50ML
400.0000 mg/m2 | Freq: Once | INTRAVENOUS | Status: AC
  Administered 2011-05-06: 750 mg via INTRAVENOUS
  Filled 2011-05-06: qty 15

## 2011-05-06 MED ORDER — DEXTROSE 5 % IV SOLN
Freq: Once | INTRAVENOUS | Status: AC
  Administered 2011-05-06: 11:00:00 via INTRAVENOUS

## 2011-05-06 MED ORDER — ONDANSETRON 8 MG/50ML IVPB (CHCC)
8.0000 mg | Freq: Once | INTRAVENOUS | Status: AC
  Administered 2011-05-06: 8 mg via INTRAVENOUS

## 2011-05-06 MED ORDER — SODIUM CHLORIDE 0.9 % IJ SOLN
10.0000 mL | INTRAMUSCULAR | Status: DC | PRN
  Filled 2011-05-06: qty 10

## 2011-05-06 MED ORDER — DEXAMETHASONE SODIUM PHOSPHATE 10 MG/ML IJ SOLN: 10.0000 mg | Freq: Once | INTRAMUSCULAR | Status: DC

## 2011-05-06 NOTE — Progress Notes (Signed)
OFFICE PROGRESS NOTE   INTERVAL HISTORY:   He completed another cycle of chemotherapy on December 5. He tolerated chemotherapy well. He denies nausea, vomiting, mouth sores, and diarrhea. He has one bowel movement each day. He has cold sensitivity immediately following chemotherapy. This has resolved. He denies neuropathy symptoms at present. He continues daily Lovenox therapy.  Objective:  Vital signs in last 24 hours:  Blood pressure 130/73, pulse 76, temperature 97.5 F (36.4 C), temperature source Oral, height 5' 9.5" (1.765 m), weight 163 lb 6.4 oz (74.118 kg).    HEENT: No thrush or ulcers Resp: Lungs clear bilaterally Cardio: Regular rate and rhythm GI: Nontender. No hepatomegaly. Vascular: Trace edema at the left lower leg. No erythema or tenderness. Neuro: The vibratory sense is intact at the fingertips bilaterally.    Portacath/PICC-without erythema  Lab Results:  Lab Results  Component Value Date   WBC 2.9* 05/06/2011   HGB 11.1* 05/06/2011   HCT 32.3* 05/06/2011   MCV 91.2 05/06/2011   PLT 102* 05/06/2011      Medications: I have reviewed the patient's current medications.  Assessment/Plan: #1-Stage III (T4, N2) adenocarcinoma of the right colon, status post a right colectomy 11/25/2010.  He began adjuvant FOLFOX chemotherapy 12/31/2010. He has completed 9 cycles to date.   #2-PET scan 12/29/2010 with hypermetabolic retroperitoneal and anterior abdominal adenopathy, postoperative changes of the right colectomy with a possible seroma in the anterior abdomen. Patchy activity was noted in the parotid glands, potentially inflammatory in etiology.  -The hypermetabolic retroperitoneal lymph nodes potentially represented metastatic disease. We presented his case at the GI tumor conference and discussed the case with Dr. Dwain Sarna. A decision was made to proceed with "adjuvant" therapy with the plan for a restaging CT at a 3 to four-month interval.  -The restaging CT  on 03/23/2011 revealed a decrease in the periaortic lymphadenopathy, no new adenopathy, and no evidence of liver metastases. This suggests the small retroperitoneal lymph nodes may represent metastatic colon cancer versus resolving "inflammatory" nodes. Restaging CT evaluation is planned 3-4 months following completion of adjuvant therapy.   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 He reports recent removal of another basal cell carcinoma from a right finger by Dr. Karlyn Agee.  #6-Left leg deep vein thrombosis confirmed on a Doppler ultrasound 01/28/2011, maintained on daily Lovenox.   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 He has no neuropathy symptoms today.  #8-Mild neutropenia on 03/11/2011, he received Neulasta following cycle #6 of FOLFOX. He also received Neulasta support following cycle 8. The white count is low  today. He will receive Neulasta support with cycle 10.   #9-Thrombocytopenia secondary to chemotherapy. Stable    Disposition:  He appears stable. He will complete cycle 10 of adjuvant FOLFOX chemotherapy today. He will receive Neulasta support. He reports tolerating Neulasta well in the past. He will return for an office visit and cycle 11 of FOLFOX in 2 weeks.   Lucile Shutters, MD  05/06/2011  2:25 PM

## 2011-05-06 NOTE — Telephone Encounter (Signed)
gve the pt his jan 2013 appt calendar °

## 2011-05-07 ENCOUNTER — Encounter: Payer: Self-pay | Admitting: *Deleted

## 2011-05-08 ENCOUNTER — Ambulatory Visit (HOSPITAL_BASED_OUTPATIENT_CLINIC_OR_DEPARTMENT_OTHER): Payer: BC Managed Care – PPO

## 2011-05-08 VITALS — BP 122/73 | HR 62 | Temp 98.7°F

## 2011-05-08 DIAGNOSIS — Z5189 Encounter for other specified aftercare: Secondary | ICD-10-CM

## 2011-05-08 DIAGNOSIS — C189 Malignant neoplasm of colon, unspecified: Secondary | ICD-10-CM

## 2011-05-08 MED ORDER — HEPARIN SOD (PORK) LOCK FLUSH 100 UNIT/ML IV SOLN
500.0000 [IU] | Freq: Once | INTRAVENOUS | Status: AC | PRN
  Administered 2011-05-08: 500 [IU]
  Filled 2011-05-08: qty 5

## 2011-05-08 MED ORDER — SODIUM CHLORIDE 0.9 % IJ SOLN
10.0000 mL | INTRAMUSCULAR | Status: DC | PRN
  Administered 2011-05-08: 10 mL
  Filled 2011-05-08: qty 10

## 2011-05-08 MED ORDER — PEGFILGRASTIM INJECTION 6 MG/0.6ML
6.0000 mg | Freq: Once | SUBCUTANEOUS | Status: AC
  Administered 2011-05-08: 6 mg via SUBCUTANEOUS
  Filled 2011-05-08: qty 0.6

## 2011-05-08 NOTE — Patient Instructions (Signed)
Call MD for problems 

## 2011-05-17 ENCOUNTER — Other Ambulatory Visit: Payer: Self-pay | Admitting: Oncology

## 2011-05-21 ENCOUNTER — Ambulatory Visit (HOSPITAL_BASED_OUTPATIENT_CLINIC_OR_DEPARTMENT_OTHER): Payer: BC Managed Care – PPO | Admitting: Nurse Practitioner

## 2011-05-21 ENCOUNTER — Other Ambulatory Visit (HOSPITAL_BASED_OUTPATIENT_CLINIC_OR_DEPARTMENT_OTHER): Payer: BC Managed Care – PPO | Admitting: Lab

## 2011-05-21 ENCOUNTER — Ambulatory Visit (HOSPITAL_BASED_OUTPATIENT_CLINIC_OR_DEPARTMENT_OTHER): Payer: BC Managed Care – PPO

## 2011-05-21 ENCOUNTER — Other Ambulatory Visit: Payer: Self-pay | Admitting: Certified Registered Nurse Anesthetist

## 2011-05-21 VITALS — BP 133/76 | HR 68 | Temp 97.7°F | Ht 69.5 in | Wt 158.4 lb

## 2011-05-21 DIAGNOSIS — C189 Malignant neoplasm of colon, unspecified: Secondary | ICD-10-CM

## 2011-05-21 DIAGNOSIS — Z9889 Other specified postprocedural states: Secondary | ICD-10-CM

## 2011-05-21 DIAGNOSIS — Z5111 Encounter for antineoplastic chemotherapy: Secondary | ICD-10-CM

## 2011-05-21 DIAGNOSIS — Z79899 Other long term (current) drug therapy: Secondary | ICD-10-CM

## 2011-05-21 DIAGNOSIS — Z85828 Personal history of other malignant neoplasm of skin: Secondary | ICD-10-CM

## 2011-05-21 LAB — COMPREHENSIVE METABOLIC PANEL
AST: 41 U/L — ABNORMAL HIGH (ref 0–37)
Albumin: 3.3 g/dL — ABNORMAL LOW (ref 3.5–5.2)
BUN: 13 mg/dL (ref 6–23)
Calcium: 9.2 mg/dL (ref 8.4–10.5)
Chloride: 105 mEq/L (ref 96–112)
Glucose, Bld: 108 mg/dL — ABNORMAL HIGH (ref 70–99)
Potassium: 4.1 mEq/L (ref 3.5–5.3)

## 2011-05-21 LAB — CBC WITH DIFFERENTIAL/PLATELET
BASO%: 0.7 % (ref 0.0–2.0)
Basophils Absolute: 0.1 10*3/uL (ref 0.0–0.1)
Eosinophils Absolute: 0.1 10*3/uL (ref 0.0–0.5)
HCT: 34.4 % — ABNORMAL LOW (ref 38.4–49.9)
HGB: 11.9 g/dL — ABNORMAL LOW (ref 13.0–17.1)
MONO#: 0.7 10*3/uL (ref 0.1–0.9)
NEUT%: 74.4 % (ref 39.0–75.0)
WBC: 9.2 10*3/uL (ref 4.0–10.3)
lymph#: 1.5 10*3/uL (ref 0.9–3.3)

## 2011-05-21 MED ORDER — ONDANSETRON 8 MG/50ML IVPB (CHCC)
8.0000 mg | Freq: Once | INTRAVENOUS | Status: AC
  Administered 2011-05-21: 8 mg via INTRAVENOUS

## 2011-05-21 MED ORDER — DEXAMETHASONE SODIUM PHOSPHATE 10 MG/ML IJ SOLN
10.0000 mg | Freq: Once | INTRAMUSCULAR | Status: AC
  Administered 2011-05-21: 10 mg via INTRAVENOUS

## 2011-05-21 MED ORDER — DEXTROSE 5 % IV SOLN
Freq: Once | INTRAVENOUS | Status: AC
  Administered 2011-05-21: 12:00:00 via INTRAVENOUS

## 2011-05-21 MED ORDER — OXALIPLATIN CHEMO INJECTION 100 MG/20ML
85.0000 mg/m2 | Freq: Once | INTRAVENOUS | Status: AC
  Administered 2011-05-21: 160 mg via INTRAVENOUS
  Filled 2011-05-21: qty 32

## 2011-05-21 MED ORDER — FLUOROURACIL CHEMO INJECTION 2.5 GM/50ML
400.0000 mg/m2 | Freq: Once | INTRAVENOUS | Status: AC
  Administered 2011-05-21: 750 mg via INTRAVENOUS
  Filled 2011-05-21: qty 15

## 2011-05-21 MED ORDER — LEUCOVORIN CALCIUM INJECTION 350 MG
400.0000 mg/m2 | Freq: Once | INTRAVENOUS | Status: AC
  Administered 2011-05-21: 744 mg via INTRAVENOUS
  Filled 2011-05-21: qty 37.2

## 2011-05-21 MED ORDER — SODIUM CHLORIDE 0.9 % IV SOLN
2400.0000 mg/m2 | INTRAVENOUS | Status: DC
  Administered 2011-05-21: 4450 mg via INTRAVENOUS
  Filled 2011-05-21: qty 89

## 2011-05-21 NOTE — Patient Instructions (Signed)
Patient ambulatory out of clinic with spouse.  No complaints at discharge, 5-FU pump infusing.  Instructed patient to call with any issues.  Patient aware of next appointment

## 2011-05-21 NOTE — Progress Notes (Signed)
OFFICE PROGRESS NOTE  Interval history:  Mr. Begley returns as scheduled. He completed cycle 10 FOLFOX 05/06/2011. He denies nausea/vomiting. No mouth sores. He has occasional loose stools. He has mild numbness in the fingertips. The numbness does not interfere with activity. He notes progressive fatigue.   Objective: Blood pressure 133/76, pulse 68, temperature 97.7 F (36.5 C), temperature source Oral, height 5' 9.5" (1.765 m), weight 158 lb 6.4 oz (71.85 kg).  Oropharynx is without thrush or ulceration. Lungs are clear. Regular cardiac rhythm. Port-A-Cath site is without erythema. Abdomen is soft and nontender. No hepatomegaly. Extremities are without edema. The left lower leg appears slightly larger than the right lower leg. Vibratory sense is intact over the fingertips per tuning fork exam.   Lab Results: Lab Results  Component Value Date   WBC 9.2 05/21/2011   HGB 11.9* 05/21/2011   HCT 34.4* 05/21/2011   MCV 93.7 05/21/2011   PLT 142 05/21/2011    Chemistry:    Chemistry      Component Value Date/Time   NA 138 05/06/2011 0901   K 3.9 05/06/2011 0901   CL 106 05/06/2011 0901   CO2 26 05/06/2011 0901   BUN 17 05/06/2011 0901   CREATININE 0.94 05/06/2011 0901      Component Value Date/Time   CALCIUM 9.4 05/06/2011 0901   ALKPHOS 83 05/06/2011 0901   AST 54* 05/06/2011 0901   ALT 65* 05/06/2011 0901   BILITOT 0.2* 05/06/2011 0901       Studies/Results: No results found.   Assessment/Plan:  #1-Stage III (T4, N2) adenocarcinoma of the right colon, status post a right colectomy 11/25/2010.  He began adjuvant FOLFOX chemotherapy 12/31/2010. He has completed 10 cycles to date.  #2-PET scan 12/29/2010 with hypermetabolic retroperitoneal and anterior abdominal adenopathy, postoperative changes of the right colectomy with a possible seroma in the anterior abdomen. Patchy activity was noted in the parotid glands, potentially inflammatory in etiology.  -The hypermetabolic  retroperitoneal lymph nodes potentially represented metastatic disease. We presented his case at the GI tumor conference and discussed the case with Dr. Dwain Sarna. A decision was made to proceed with "adjuvant" therapy with the plan for a restaging CT at a 3 to four-month interval.  -The restaging CT on 03/23/2011 revealed a decrease in the periaortic lymphadenopathy, no new adenopathy, and no evidence of liver metastases. This suggests the small retroperitoneal lymph nodes may represent metastatic colon cancer versus resolving "inflammatory" nodes. Restaging CT evaluation is planned 3-4 months following completion of adjuvant therapy.  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 He reports recent removal of another basal cell carcinoma from a right finger by Dr. Karlyn Agee.  #6-Left leg deep vein thrombosis confirmed on a Doppler ultrasound 01/28/2011, maintained on daily Lovenox.  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 He also has mild numbness in the fingertips. The numbness does not interfere with activity. Vibratory sense is intact.  #8-Mild neutropenia on 03/11/2011, he received Neulasta following cycle #6 of FOLFOX. He also received Neulasta support following cycle 8 and cycle 10.  #9-Thrombocytopenia secondary to chemotherapy. Improved.  Disposition-Mr. Metayer appears stable. Plan to proceed with cycle 11 FOLFOX today as scheduled. He will return for a followup visit and the 12th and final cycle of FOLFOX in 2 weeks. He will  contact the office in the interim with any problems.    Lonna Cobb ANP/GNP-BC

## 2011-05-22 ENCOUNTER — Other Ambulatory Visit: Payer: Self-pay | Admitting: Nurse Practitioner

## 2011-05-23 ENCOUNTER — Ambulatory Visit (HOSPITAL_BASED_OUTPATIENT_CLINIC_OR_DEPARTMENT_OTHER): Payer: BC Managed Care – PPO

## 2011-05-23 VITALS — BP 123/70 | HR 91 | Temp 98.1°F

## 2011-05-23 DIAGNOSIS — C189 Malignant neoplasm of colon, unspecified: Secondary | ICD-10-CM

## 2011-05-23 MED ORDER — HEPARIN SOD (PORK) LOCK FLUSH 100 UNIT/ML IV SOLN
500.0000 [IU] | Freq: Once | INTRAVENOUS | Status: AC | PRN
  Administered 2011-05-23: 500 [IU]
  Filled 2011-05-23: qty 5

## 2011-05-23 MED ORDER — SODIUM CHLORIDE 0.9 % IJ SOLN
10.0000 mL | INTRAMUSCULAR | Status: DC | PRN
  Administered 2011-05-23: 10 mL
  Filled 2011-05-23: qty 10

## 2011-06-02 ENCOUNTER — Other Ambulatory Visit: Payer: Self-pay | Admitting: Oncology

## 2011-06-03 ENCOUNTER — Other Ambulatory Visit: Payer: BC Managed Care – PPO | Admitting: Lab

## 2011-06-03 ENCOUNTER — Ambulatory Visit (HOSPITAL_BASED_OUTPATIENT_CLINIC_OR_DEPARTMENT_OTHER): Payer: BC Managed Care – PPO

## 2011-06-03 ENCOUNTER — Telehealth: Payer: Self-pay | Admitting: Oncology

## 2011-06-03 ENCOUNTER — Ambulatory Visit: Payer: BC Managed Care – PPO | Admitting: Oncology

## 2011-06-03 VITALS — BP 125/73 | HR 69 | Temp 97.3°F | Ht 69.5 in | Wt 157.5 lb

## 2011-06-03 DIAGNOSIS — Z5111 Encounter for antineoplastic chemotherapy: Secondary | ICD-10-CM

## 2011-06-03 DIAGNOSIS — D6959 Other secondary thrombocytopenia: Secondary | ICD-10-CM

## 2011-06-03 DIAGNOSIS — D709 Neutropenia, unspecified: Secondary | ICD-10-CM

## 2011-06-03 DIAGNOSIS — C189 Malignant neoplasm of colon, unspecified: Secondary | ICD-10-CM

## 2011-06-03 LAB — CBC WITH DIFFERENTIAL/PLATELET
Basophils Absolute: 0 10*3/uL (ref 0.0–0.1)
EOS%: 6.1 % (ref 0.0–7.0)
HGB: 11 g/dL — ABNORMAL LOW (ref 13.0–17.1)
MCH: 32.4 pg (ref 27.2–33.4)
MCV: 93.2 fL (ref 79.3–98.0)
MONO%: 18.1 % — ABNORMAL HIGH (ref 0.0–14.0)
RBC: 3.39 10*6/uL — ABNORMAL LOW (ref 4.20–5.82)
RDW: 14.6 % (ref 11.0–14.6)

## 2011-06-03 LAB — COMPREHENSIVE METABOLIC PANEL
Alkaline Phosphatase: 94 U/L (ref 39–117)
Glucose, Bld: 101 mg/dL — ABNORMAL HIGH (ref 70–99)
Sodium: 141 mEq/L (ref 135–145)
Total Bilirubin: 0.2 mg/dL — ABNORMAL LOW (ref 0.3–1.2)
Total Protein: 6.4 g/dL (ref 6.0–8.3)

## 2011-06-03 MED ORDER — DEXTROSE 5 % IV SOLN
Freq: Once | INTRAVENOUS | Status: AC
  Administered 2011-06-03: 11:00:00 via INTRAVENOUS

## 2011-06-03 MED ORDER — DEXAMETHASONE SODIUM PHOSPHATE 10 MG/ML IJ SOLN
10.0000 mg | Freq: Once | INTRAMUSCULAR | Status: AC
  Administered 2011-06-03: 10 mg via INTRAVENOUS

## 2011-06-03 MED ORDER — LEUCOVORIN CALCIUM INJECTION 350 MG
400.0000 mg/m2 | Freq: Once | INTRAVENOUS | Status: AC
  Administered 2011-06-03: 744 mg via INTRAVENOUS
  Filled 2011-06-03: qty 37.2

## 2011-06-03 MED ORDER — ONDANSETRON 8 MG/50ML IVPB (CHCC)
8.0000 mg | Freq: Once | INTRAVENOUS | Status: AC
  Administered 2011-06-03: 8 mg via INTRAVENOUS

## 2011-06-03 MED ORDER — OXALIPLATIN CHEMO INJECTION 100 MG/20ML
85.0000 mg/m2 | Freq: Once | INTRAVENOUS | Status: AC
  Administered 2011-06-03: 160 mg via INTRAVENOUS
  Filled 2011-06-03: qty 32

## 2011-06-03 MED ORDER — SODIUM CHLORIDE 0.9 % IV SOLN
2400.0000 mg/m2 | INTRAVENOUS | Status: DC
  Administered 2011-06-03: 4450 mg via INTRAVENOUS
  Filled 2011-06-03: qty 89

## 2011-06-03 MED ORDER — FLUOROURACIL CHEMO INJECTION 2.5 GM/50ML
400.0000 mg/m2 | Freq: Once | INTRAVENOUS | Status: AC
  Administered 2011-06-03: 750 mg via INTRAVENOUS
  Filled 2011-06-03: qty 15

## 2011-06-03 NOTE — Patient Instructions (Signed)
[age] d/c'd ambulatory to home with wife. No complaints. Encouraged to call MD with any questions/concerns

## 2011-06-03 NOTE — Progress Notes (Signed)
OFFICE PROGRESS NOTE   INTERVAL HISTORY:   He returns as scheduled. He completed another cycle of FOLFOX on 05/21/2011. He reports tolerating the chemotherapy well. He denies nausea, mouth sores, and diarrhea. He noted prolonged cold sensitivity following the most recent cycle of chemotherapy. He now has mild numbness in the tips of the fingers and toes. This does not interfere with activities. He continues to work. He has mild malaise.  Objective:  Vital signs in last 24 hours:  Blood pressure 125/73, pulse 69, temperature 97.3 F (36.3 C), temperature source Oral, height 5' 9.5" (1.765 m), weight 157 lb 8 oz (71.442 kg).    HEENT: No thrush or ulcers Resp: Lungs clear bilaterally Cardio: Regular rate and rhythm GI: No hepatomegaly, nontender Vascular: The left lower leg is slightly larger than the right side. Neuro: Sensation is intact to light touch and pinprick at the fingertips. Sensation is intact to vibratory sense at the fingertips bilaterally.   Portacath/PICC-without erythema  Lab Results:  Lab Results  Component Value Date   WBC 2.9* 06/03/2011   HGB 11.0* 06/03/2011   HCT 31.6* 06/03/2011   MCV 93.2 06/03/2011   PLT 101* 06/03/2011   ANC 1.5   Medications: I have reviewed the patient's current medications.  Assessment/Plan: #1-Stage III (T4, N2) adenocarcinoma of the right colon, status post a right colectomy 11/25/2010.  He began adjuvant FOLFOX chemotherapy 12/31/2010. He has completed 11 cycles to date.  #2-PET scan 12/29/2010 with hypermetabolic retroperitoneal and anterior abdominal adenopathy, postoperative changes of the right colectomy with a possible seroma in the anterior abdomen. Patchy activity was noted in the parotid glands, potentially inflammatory in etiology.  -The hypermetabolic retroperitoneal lymph nodes potentially represented metastatic disease. We presented his case at the GI tumor conference and discussed the case with Dr. Dwain Sarna. A  decision was made to proceed with "adjuvant" therapy with the plan for a restaging CT at a 3 to four-month interval.  -The restaging CT on 03/23/2011 revealed a decrease in the periaortic lymphadenopathy, no new adenopathy, and no evidence of liver metastases. This suggests the small retroperitoneal lymph nodes may represent metastatic colon cancer versus resolving "inflammatory" nodes. Restaging CT evaluation is planned 3-4 months following completion of adjuvant therapy.  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 He reports recent removal of another basal cell carcinoma from a right finger by Dr. Karlyn Agee.  #6-Left leg deep vein thrombosis confirmed on a Doppler ultrasound 01/28/2011, maintained on daily Lovenox.  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 He also has mild numbness in the fingertips and toes. The numbness does not interfere with activity. Vibratory sense is intact.  #8-Mild neutropenia on 03/11/2011, he received Neulasta following cycle #6 of FOLFOX. He also received Neulasta support following cycle 8 and cycle 10. He will receive Neulasta following cycle #12. #9-Thrombocytopenia secondary to chemotherapy.     Disposition:  He appears stable. He will complete the final cycle of adjuvant chemotherapy today. We discussed the risk versus benefit of continuing oxaliplatin chemotherapy in the setting of mild neuropathy. He agrees with continuing the oxaliplatin.  He will return for a nadir CBC in 2 weeks. He knows to contact us for bleeding or signs of an infection.  He will be scheduled for a restaging CT of the abdomen on 07/13/2011.  He will return for an office visit on 07/15/2011. We will ask Dr. Dwain Sarna to remove the Port-A-Cath if the CT  shows no evidence of cancer.   Lucile Shutters, MD  06/03/2011  11:22 AM

## 2011-06-03 NOTE — Telephone Encounter (Signed)
Talked to [facility] was not scheduled due to wrong order per Radiology, routed to MD

## 2011-06-05 ENCOUNTER — Ambulatory Visit (HOSPITAL_BASED_OUTPATIENT_CLINIC_OR_DEPARTMENT_OTHER): Payer: BC Managed Care – PPO

## 2011-06-05 VITALS — BP 140/73 | HR 68 | Temp 98.7°F

## 2011-06-05 DIAGNOSIS — C189 Malignant neoplasm of colon, unspecified: Secondary | ICD-10-CM

## 2011-06-05 MED ORDER — SODIUM CHLORIDE 0.9 % IJ SOLN
10.0000 mL | INTRAMUSCULAR | Status: DC | PRN
  Administered 2011-06-05: 10 mL
  Filled 2011-06-05: qty 10

## 2011-06-05 MED ORDER — HEPARIN SOD (PORK) LOCK FLUSH 100 UNIT/ML IV SOLN
500.0000 [IU] | Freq: Once | INTRAVENOUS | Status: AC | PRN
  Administered 2011-06-05: 500 [IU]
  Filled 2011-06-05: qty 5

## 2011-06-05 MED ORDER — PEGFILGRASTIM INJECTION 6 MG/0.6ML
6.0000 mg | Freq: Once | SUBCUTANEOUS | Status: AC
  Administered 2011-06-05: 6 mg via SUBCUTANEOUS
  Filled 2011-06-05: qty 0.6

## 2011-06-08 ENCOUNTER — Other Ambulatory Visit: Payer: Self-pay | Admitting: Certified Registered Nurse Anesthetist

## 2011-06-17 ENCOUNTER — Other Ambulatory Visit (HOSPITAL_BASED_OUTPATIENT_CLINIC_OR_DEPARTMENT_OTHER): Payer: BC Managed Care – PPO | Admitting: Lab

## 2011-06-17 DIAGNOSIS — C189 Malignant neoplasm of colon, unspecified: Secondary | ICD-10-CM

## 2011-06-17 LAB — CBC WITH DIFFERENTIAL/PLATELET
EOS%: 1.5 % (ref 0.0–7.0)
MCH: 33.2 pg (ref 27.2–33.4)
MCHC: 34.5 g/dL (ref 32.0–36.0)
MCV: 96.4 fL (ref 79.3–98.0)
MONO%: 9.2 % (ref 0.0–14.0)
RBC: 3.37 10*6/uL — ABNORMAL LOW (ref 4.20–5.82)
RDW: 14.9 % — ABNORMAL HIGH (ref 11.0–14.6)

## 2011-07-06 ENCOUNTER — Telehealth: Payer: Self-pay | Admitting: *Deleted

## 2011-07-06 NOTE — Telephone Encounter (Signed)
[employer] Case Manager, Jess Barters called to leave her contact [phone] ext [phone] if she is needed to help in patient's care.

## 2011-07-13 ENCOUNTER — Encounter (HOSPITAL_COMMUNITY): Payer: Self-pay

## 2011-07-13 ENCOUNTER — Ambulatory Visit (HOSPITAL_COMMUNITY)
Admission: RE | Admit: 2011-07-13 | Discharge: 2011-07-13 | Disposition: A | Discharge status: Home / Self Care | Payer: BC Managed Care – PPO | Source: Ambulatory Visit | Attending: Oncology | Admitting: Oncology

## 2011-07-13 ENCOUNTER — Other Ambulatory Visit: Payer: Self-pay | Admitting: Oncology

## 2011-07-13 DIAGNOSIS — N281 Cyst of kidney, acquired: Secondary | ICD-10-CM | POA: Insufficient documentation

## 2011-07-13 DIAGNOSIS — C189 Malignant neoplasm of colon, unspecified: Secondary | ICD-10-CM | POA: Insufficient documentation

## 2011-07-13 DIAGNOSIS — Z9049 Acquired absence of other specified parts of digestive tract: Secondary | ICD-10-CM | POA: Insufficient documentation

## 2011-07-13 DIAGNOSIS — C772 Secondary and unspecified malignant neoplasm of intra-abdominal lymph nodes: Secondary | ICD-10-CM | POA: Insufficient documentation

## 2011-07-13 MED ORDER — IOHEXOL 300 MG/ML  SOLN
100.0000 mL | Freq: Once | INTRAMUSCULAR | Status: AC | PRN
  Administered 2011-07-13: 100 mL via INTRAVENOUS

## 2011-07-14 ENCOUNTER — Other Ambulatory Visit: Payer: Self-pay | Admitting: Oncology

## 2011-07-15 ENCOUNTER — Telehealth: Payer: Self-pay | Admitting: Oncology

## 2011-07-15 ENCOUNTER — Ambulatory Visit (HOSPITAL_BASED_OUTPATIENT_CLINIC_OR_DEPARTMENT_OTHER): Payer: BC Managed Care – PPO | Admitting: Oncology

## 2011-07-15 ENCOUNTER — Other Ambulatory Visit (HOSPITAL_BASED_OUTPATIENT_CLINIC_OR_DEPARTMENT_OTHER): Payer: BC Managed Care – PPO | Admitting: Lab

## 2011-07-15 VITALS — BP 123/68 | HR 68 | Temp 98.6°F | Ht 69.5 in | Wt 165.1 lb

## 2011-07-15 DIAGNOSIS — C189 Malignant neoplasm of colon, unspecified: Secondary | ICD-10-CM

## 2011-07-15 DIAGNOSIS — R935 Abnormal findings on diagnostic imaging of other abdominal regions, including retroperitoneum: Secondary | ICD-10-CM

## 2011-07-15 LAB — CBC WITH DIFFERENTIAL/PLATELET
BASO%: 0.5 % (ref 0.0–2.0)
Eosinophils Absolute: 0.2 10*3/uL (ref 0.0–0.5)
MCHC: 34.4 g/dL (ref 32.0–36.0)
MCV: 96.1 fL (ref 79.3–98.0)
MONO#: 0.3 10*3/uL (ref 0.1–0.9)
MONO%: 5.8 % (ref 0.0–14.0)
NEUT#: 4.3 10*3/uL (ref 1.5–6.5)
RBC: 3.63 10*6/uL — ABNORMAL LOW (ref 4.20–5.82)
RDW: 13 % (ref 11.0–14.6)
WBC: 5.5 10*3/uL (ref 4.0–10.3)

## 2011-07-15 NOTE — Telephone Encounter (Signed)
Gv pt appt for march2013.  scheduled pt for pet scan for 03/06  @ [facility]

## 2011-07-15 NOTE — Progress Notes (Signed)
OFFICE PROGRESS NOTE   INTERVAL HISTORY:   He completed a final cycle of FOLFOX chemotherapy on 06/03/2011. He has noted increased neuropathy symptoms, chiefly at the feet. He feels like he is standing on "rocks ". He is able to walk without difficulty.  He has a good appetite and energy level.  Objective:  Vital signs in last 24 hours:  Blood pressure 123/68, pulse 68, temperature 98.6 F (37 C), temperature source Oral, height 5' 9.5" (1.765 m), weight 165 lb 1.6 oz (74.889 kg).    HEENT: No thrush or ulcers Lymphatics: No cervical, supraclavicular, axillary, or inguinal nodes Resp: Lungs clear bilaterally Cardio: Regular rate and rhythm GI: No hepatomegaly, nontender, no mass Vascular: The left lower leg is larger than the right side Neuro: Mild decrease in vibratory sense at the fingertip   Portacath/PICC-without erythema  Lab Results:  Lab Results  Component Value Date   WBC 5.5 07/15/2011   HGB 12.0* 07/15/2011   HCT 34.9* 07/15/2011   MCV 96.1 07/15/2011   PLT 177 07/15/2011   ANC 4.3  X-rays: A CT of the abdomen and pelvis on 07/13/2011 was compared to a CT from 03/23/2011. The liver is within normal limits. There is retroperitoneal lymphadenopathy including a 1.7 cm periaortic node that previously measured 8 mm.  Medications: I have reviewed the patient's current medications.  Assessment/Plan: #1-Stage III (T4, N2) adenocarcinoma of the right colon, status post a right colectomy 11/25/2010.  He began adjuvant FOLFOX chemotherapy 12/31/2010. He completed cycle 12 of FOLFOX on 06/03/2011. #2-PET scan 12/29/2010 with hypermetabolic retroperitoneal and anterior abdominal adenopathy, postoperative changes of the right colectomy with a possible seroma in the anterior abdomen. Patchy activity was noted in the parotid glands, potentially inflammatory in etiology.  -The hypermetabolic retroperitoneal lymph nodes potentially represented metastatic disease. We presented his  case at the GI tumor conference and discussed the case with Dr. Dwain Sarna. A decision was made to proceed with "adjuvant" therapy with the plan for a restaging CT at a 3 to four-month interval.  -The restaging CT on 03/23/2011 revealed a decrease in the periaortic lymphadenopathy, no new adenopathy, and no evidence of liver metastases. This suggests the small retroperitoneal lymph nodes may represent metastatic colon cancer versus resolving "inflammatory" nodes.  -Restaging CT 07/13/2011 revealed increased retroperitoneal lymphadenopathy consistent with metastatic disease.  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 He reports recent removal of another basal cell carcinoma from a right finger by Dr. Karlyn Agee.  #6-Left leg deep vein thrombosis confirmed on a Doppler ultrasound 01/28/2011, maintained on daily Lovenox.  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 He also has mild numbness in the fingertips and toes. The numbness does not interfere with activity. Vibratory sense is mildly decreased.  #8-Mild neutropenia on 03/11/2011, he received Neulasta following cycle #6 of FOLFOX. He also received Neulasta support following cycle 8 and cycle 10. He will receive Neulasta following cycle #12.  #9-Thrombocytopenia secondary to chemotherapy-resolved  Disposition:  He has completed 12 cycles of adjuvant FOLFOX chemotherapy. The restaging CT scan on 07/13/2011 suggest progressive retroperitoneal lymphadenopathy consistent with metastatic disease. We presented his case at the GI tumor conference today and reviewed the restaging CT scans. I reviewed the CT scans with the patient and his wife.  We decided to proceed with a staging PET  scan and biopsy of a retroperitoneal lymph  node if they are hypermetabolic. If he is confirmed to have metastatic disease involving only retroperitoneal lymph nodes we will consider making a referral to [facility] for medical oncology and surgical oncology consultation. He'll be scheduled for the PET scan within the next one to 2 weeks, followed by a biopsy as indicated. He is scheduled for an office visit on 07/30/2011.   Lucile Shutters, MD  07/15/2011  9:16 PM

## 2011-07-17 ENCOUNTER — Other Ambulatory Visit: Payer: Self-pay | Admitting: Oncology

## 2011-07-17 DIAGNOSIS — I82409 Acute embolism and thrombosis of unspecified deep veins of unspecified lower extremity: Secondary | ICD-10-CM

## 2011-07-17 MED ORDER — ENOXAPARIN SODIUM 100 MG/ML ~~LOC~~ SOLN: 100.0000 mg | SUBCUTANEOUS | Status: DC

## 2011-07-21 ENCOUNTER — Other Ambulatory Visit: Payer: Self-pay | Admitting: *Deleted

## 2011-07-21 DIAGNOSIS — I82409 Acute embolism and thrombosis of unspecified deep veins of unspecified lower extremity: Secondary | ICD-10-CM

## 2011-07-21 MED ORDER — ENOXAPARIN SODIUM 100 MG/ML ~~LOC~~ SOLN: 100.0000 mg | SUBCUTANEOUS | Status: DC

## 2011-07-22 ENCOUNTER — Other Ambulatory Visit (HOSPITAL_BASED_OUTPATIENT_CLINIC_OR_DEPARTMENT_OTHER): Payer: BC Managed Care – PPO | Admitting: Lab

## 2011-07-22 ENCOUNTER — Encounter (HOSPITAL_COMMUNITY): Payer: Self-pay

## 2011-07-22 ENCOUNTER — Other Ambulatory Visit: Payer: Self-pay | Admitting: *Deleted

## 2011-07-22 ENCOUNTER — Encounter (HOSPITAL_COMMUNITY)
Admission: RE | Admit: 2011-07-22 | Discharge: 2011-07-22 | Disposition: A | Discharge status: Home / Self Care | Payer: BC Managed Care – PPO | Source: Ambulatory Visit | Attending: Oncology | Admitting: Oncology

## 2011-07-22 DIAGNOSIS — C189 Malignant neoplasm of colon, unspecified: Secondary | ICD-10-CM

## 2011-07-22 DIAGNOSIS — Z9049 Acquired absence of other specified parts of digestive tract: Secondary | ICD-10-CM | POA: Insufficient documentation

## 2011-07-22 LAB — CEA: CEA: 0.6 ng/mL (ref 0.0–5.0)

## 2011-07-22 LAB — GLUCOSE, CAPILLARY: Glucose-Capillary: 105 mg/dL — ABNORMAL HIGH (ref 70–99)

## 2011-07-22 MED ORDER — FLUDEOXYGLUCOSE F - 18 (FDG) INJECTION
19.6000 | Freq: Once | INTRAVENOUS | Status: AC | PRN
  Administered 2011-07-22: 19.6 via INTRAVENOUS

## 2011-07-22 NOTE — Telephone Encounter (Signed)
Patient left VM requesting nurse call back to confirm his Lovenox refill was processed as requested yesterday.

## 2011-07-22 NOTE — Telephone Encounter (Signed)
Left VM on cell # that refill was faxed for 90 day supply with #3 refills to [facility] on 07/21/11 at 1:40pm. Suggested he follow up with pharmacy regarding when to expect delivery.

## 2011-07-30 ENCOUNTER — Ambulatory Visit (HOSPITAL_BASED_OUTPATIENT_CLINIC_OR_DEPARTMENT_OTHER): Payer: BC Managed Care – PPO | Admitting: Nurse Practitioner

## 2011-07-30 ENCOUNTER — Other Ambulatory Visit: Payer: Self-pay | Admitting: *Deleted

## 2011-07-30 ENCOUNTER — Ambulatory Visit: Payer: BC Managed Care – PPO | Admitting: Nurse Practitioner

## 2011-07-30 ENCOUNTER — Telehealth: Payer: Self-pay | Admitting: Oncology

## 2011-07-30 VITALS — BP 130/76 | HR 64 | Temp 97.4°F | Ht 69.5 in | Wt 171.1 lb

## 2011-07-30 DIAGNOSIS — G589 Mononeuropathy, unspecified: Secondary | ICD-10-CM

## 2011-07-30 DIAGNOSIS — R599 Enlarged lymph nodes, unspecified: Secondary | ICD-10-CM

## 2011-07-30 DIAGNOSIS — C189 Malignant neoplasm of colon, unspecified: Secondary | ICD-10-CM

## 2011-07-30 MED ORDER — LIDOCAINE-PRILOCAINE 2.5-2.5 % EX CREA: TOPICAL_CREAM | CUTANEOUS | Status: DC | PRN

## 2011-07-30 NOTE — Progress Notes (Signed)
OFFICE PROGRESS NOTE   INTERVAL HISTORY:   He returns as scheduled. He continues to have neuropathy symptoms in the hands and feet. He has mild difficulty with fine motor tasks at the fingers.  He has no other complaints.  Objective:  Vital signs in last 24 hours:  Blood pressure 130/76, pulse 64, temperature 97.4 F (36.3 C), temperature source Oral, height 5' 9.5" (1.765 m), weight 171 lb 1.6 oz (77.61 kg).   Physical examination: Not performed today  X-rays: A restaging PET scan on 07/22/2011 revealed no hypermetabolic mediastinal or hilar nodes, no suspicious pulmonary nodules, a hypermetabolic node at the level of the left renal vein, for smaller hypermetabolic retroperitoneal lymph nodes within the central mesentery, there is a hypermetabolic aortocaval node. No abnormal hypermetabolic activity within the liver. No hypermetabolic pelvic lymph nodes.   Medications: I have reviewed the patient's current medications.  Assessment/Plan: #1-Stage III (T4, N2) adenocarcinoma of the right colon, status post a right colectomy 11/25/2010.  He began adjuvant FOLFOX chemotherapy 12/31/2010. He completed cycle 12 of FOLFOX on 06/03/2011.  #2-PET scan 12/29/2010 with hypermetabolic retroperitoneal and anterior abdominal adenopathy, postoperative changes of the right colectomy with a possible seroma in the anterior abdomen. Patchy activity was noted in the parotid glands, potentially inflammatory in etiology.  -The hypermetabolic retroperitoneal lymph nodes potentially represented metastatic disease. We presented his case at the GI tumor conference and discussed the case with Dr. Dwain Sarna. A decision was made to proceed with "adjuvant" therapy with the plan for a restaging CT at a 3 to four-month interval.  -The restaging CT on 03/23/2011 revealed a decrease in the periaortic lymphadenopathy, no new adenopathy, and no evidence of liver metastases. This suggests the small retroperitoneal lymph  nodes may represent metastatic colon cancer versus resolving "inflammatory" nodes. -Restaging CT 07/13/2011 revealed increased retroperitoneal lymphadenopathy consistent with metastatic disease.  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 He reports recent removal of another basal cell carcinoma from a right finger by Dr. Karlyn Agee.  #6-Left leg deep vein thrombosis confirmed on a Doppler ultrasound 01/28/2011, maintained on daily Lovenox.  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 He also has mild numbness in the fingertips and toes.  Vibratory sense is mildly decreased.  #8-Mild neutropenia on 03/11/2011, he received Neulasta following cycle #6 of FOLFOX. He also received Neulasta support following cycle 8, cycle 10 and cycle #12.  #9-Thrombocytopenia secondary to chemotherapy-resolved    Disposition:  He appears stable. I reviewed the staging PET scan with the patient and his wife. We discussed treatment options in detail. He understands the hypermetabolic retroperitoneal lymph nodes likely represent metastatic colon cancer. Therefore it is unlikely that any therapy will be curative.  We discussed observation, salvage systemic chemotherapy, and a referral to consider resection of the hypermetabolic lymph nodes. I discussed the president for metastatic in the procedures in patients with lung/liver metastases from colon cancer. I explained there is no clear data to support a survival benefit or potential for cure with resection of metastatic lymph nodes. However he has no clinical or x-ray evidence of additional metastatic disease, he is otherwise healthy, and his cancer  could behave differently in the setting of HNPCC.  We also discussed referring him for a biopsy in  interventional radiology or via an EUS approach.  We decided to make a referral to the surgical oncology and GI oncology services at [facility] for a complete evaluation. We can arrange for a biopsy of one of the retroperitoneal lymph nodes here if this is recommended.  He will return for an office visit here in approximately 3 weeks. I cautioned him to be careful with motor activities in the setting of neuropathy.   Lucile Shutters, MD  07/30/2011  5:28 PM

## 2011-07-30 NOTE — Telephone Encounter (Signed)
Gave pt appt today and print to [facility] referral and gave to [facility] @ [facility]

## 2011-08-14 ENCOUNTER — Telehealth: Payer: Self-pay | Admitting: Oncology

## 2011-08-14 NOTE — Telephone Encounter (Signed)
called pt lmovm that his appt time on 4-08 was moved to 4pm

## 2011-08-24 ENCOUNTER — Telehealth: Payer: Self-pay | Admitting: Oncology

## 2011-08-24 ENCOUNTER — Ambulatory Visit: Payer: BC Managed Care – PPO | Admitting: Oncology

## 2011-08-24 ENCOUNTER — Ambulatory Visit (HOSPITAL_BASED_OUTPATIENT_CLINIC_OR_DEPARTMENT_OTHER): Payer: BC Managed Care – PPO | Admitting: Nurse Practitioner

## 2011-08-24 VITALS — BP 136/81 | HR 56 | Temp 99.1°F | Ht 69.5 in | Wt 172.1 lb

## 2011-08-24 DIAGNOSIS — C189 Malignant neoplasm of colon, unspecified: Secondary | ICD-10-CM

## 2011-08-24 NOTE — Telephone Encounter (Signed)
appts made and printed for pt aom °

## 2011-08-25 NOTE — Progress Notes (Signed)
OFFICE PROGRESS NOTE  Interval history:  Mr. Krohn returns as scheduled. He reports persistent neuropathy symptoms in the fingertips and toes. He occasionally drops items. He denies any difficulty with ambulation. He has had a recent cough. No dyspnea or fever. He has a good appetite. He is exercising regularly.   Objective: Blood pressure 136/81, pulse 56, temperature 99.1 F (37.3 C), temperature source Oral, height 5' 9.5" (1.765 m), weight 172 lb 1.6 oz (78.064 kg).  Oropharynx is without thrush. Lungs are clear. No wheezes or rales. Regular cardiac rhythm. Port-A-Cath site is without erythema. Abdomen is soft. No hepatomegaly. Extremities are without edema. The left lower leg appears slightly larger than the right lower leg. Vibratory sense is mildly decreased at the fingertips.  Lab Results: Lab Results  Component Value Date   WBC 5.5 07/15/2011   HGB 12.0* 07/15/2011   HCT 34.9* 07/15/2011   MCV 96.1 07/15/2011   PLT 177 07/15/2011    Chemistry:    Chemistry      Component Value Date/Time   NA 141 06/03/2011 0909   K 4.1 06/03/2011 0909   CL 107 06/03/2011 0909   CO2 26 06/03/2011 0909   BUN 16 06/03/2011 0909   CREATININE 1.03 06/03/2011 0909      Component Value Date/Time   CALCIUM 9.0 06/03/2011 0909   ALKPHOS 94 06/03/2011 0909   AST 48* 06/03/2011 0909   ALT 54* 06/03/2011 0909   BILITOT 0.2* 06/03/2011 0909       Studies/Results: No results found.  Medications: I have reviewed the patient's current medications.  Assessment/Plan:  #1-Stage III (T4, N2) adenocarcinoma of the right colon, status post a right colectomy 11/25/2010.  He began adjuvant FOLFOX chemotherapy 12/31/2010. He completed cycle 12 of FOLFOX on 06/03/2011.  #2-PET scan 12/29/2010 with hypermetabolic retroperitoneal and anterior abdominal adenopathy, postoperative changes of the right colectomy with a possible seroma in the anterior abdomen. Patchy activity was noted in the parotid glands, potentially  inflammatory in etiology.  -The hypermetabolic retroperitoneal lymph nodes potentially represented metastatic disease. We presented his case at the GI tumor conference and discussed the case with Dr. Dwain Sarna. A decision was made to proceed with "adjuvant" therapy with the plan for a restaging CT at a 3 to four-month interval.  -The restaging CT on 03/23/2011 revealed a decrease in the periaortic lymphadenopathy, no new adenopathy, and no evidence of liver metastases. This suggests the small retroperitoneal lymph nodes may represent metastatic colon cancer versus resolving "inflammatory" nodes. -Restaging CT 07/13/2011 revealed increased retroperitoneal lymphadenopathy consistent with metastatic disease.  -Restaging PET scan 07/22/2011 confirmed persistent hypermetabolic retroperitoneal lymph nodes.  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 He reports recent removal of another basal cell carcinoma from a right finger by Dr. Karlyn Agee.  #6-Left leg deep vein thrombosis confirmed on a Doppler ultrasound 01/28/2011, maintained on daily Lovenox.  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 He also has mild numbness in the fingertips and toes. Vibratory sense is mildly decreased.  #8-Mild neutropenia on 03/11/2011, he received Neulasta following cycle #6 of FOLFOX. He also received Neulasta support following cycle 8, cycle 10 and cycle #12.  #9-Thrombocytopenia secondary to chemotherapy-resolved.  Disposition-Mr. Berens appears stable.  He was recently seen by Drs. O'Neal and Chauncey Mann at [facility]. Dr. Chauncey Mann did not recommend  surgery. Dr. Truett Perna recommends an observation approach with plans to repeat a CT scan at a four-month interval from the previous scan. Mr. Ruddick is  interested in another opinion by surgical oncology. We discussed a GI surgical oncology referral to several large cancer centers around the country. Mr. Dill does not have a preference and will defer to Dr. Kalman Drape recommendation. We will contact him once a referral has been made. He will return for a followup visit here on 09/22/2011. He will contact the office in the interim with any problems.  Patient seen with Dr. Truett Perna.   Lonna Cobb ANP/GNP-BC

## 2011-08-26 ENCOUNTER — Telehealth: Payer: Self-pay | Admitting: *Deleted

## 2011-08-26 NOTE — Telephone Encounter (Signed)
Call from pt stating he has changed his mind about a third opinion re: surgery. Pt agrees to observation approach for now. Asking when he'd be scanned again. Told him the plan would be to repeat scan 4 months from the last one. Pt asks if he needs to keep 09/22/11 appt? Please have schedulers call with follow up and scan appts.

## 2011-09-03 ENCOUNTER — Ambulatory Visit (HOSPITAL_BASED_OUTPATIENT_CLINIC_OR_DEPARTMENT_OTHER): Payer: BC Managed Care – PPO

## 2011-09-03 VITALS — BP 131/78 | HR 65 | Temp 98.7°F

## 2011-09-03 DIAGNOSIS — C189 Malignant neoplasm of colon, unspecified: Secondary | ICD-10-CM

## 2011-09-03 DIAGNOSIS — Z452 Encounter for adjustment and management of vascular access device: Secondary | ICD-10-CM

## 2011-09-03 MED ORDER — HEPARIN SOD (PORK) LOCK FLUSH 100 UNIT/ML IV SOLN
500.0000 [IU] | Freq: Once | INTRAVENOUS | Status: AC
  Administered 2011-09-03: 500 [IU] via INTRAVENOUS
  Filled 2011-09-03: qty 5

## 2011-09-03 MED ORDER — SODIUM CHLORIDE 0.9 % IJ SOLN
10.0000 mL | INTRAMUSCULAR | Status: DC | PRN
  Administered 2011-09-03: 10 mL via INTRAVENOUS
  Filled 2011-09-03: qty 10

## 2011-09-03 NOTE — Patient Instructions (Signed)
Call MD for problems 

## 2011-09-22 ENCOUNTER — Ambulatory Visit (HOSPITAL_BASED_OUTPATIENT_CLINIC_OR_DEPARTMENT_OTHER): Payer: BC Managed Care – PPO | Admitting: Oncology

## 2011-09-22 ENCOUNTER — Telehealth: Payer: Self-pay | Admitting: Oncology

## 2011-09-22 ENCOUNTER — Other Ambulatory Visit: Payer: Self-pay

## 2011-09-22 VITALS — BP 132/82 | HR 63 | Temp 97.8°F | Ht 69.5 in | Wt 172.0 lb

## 2011-09-22 DIAGNOSIS — C189 Malignant neoplasm of colon, unspecified: Secondary | ICD-10-CM

## 2011-09-22 DIAGNOSIS — G620 Drug-induced polyneuropathy: Secondary | ICD-10-CM

## 2011-09-22 DIAGNOSIS — Z86718 Personal history of other venous thrombosis and embolism: Secondary | ICD-10-CM

## 2011-09-22 DIAGNOSIS — I82409 Acute embolism and thrombosis of unspecified deep veins of unspecified lower extremity: Secondary | ICD-10-CM

## 2011-09-22 DIAGNOSIS — C772 Secondary and unspecified malignant neoplasm of intra-abdominal lymph nodes: Secondary | ICD-10-CM

## 2011-09-22 MED ORDER — WARFARIN SODIUM 5 MG PO TABS: 5.0000 mg | ORAL_TABLET | Freq: Every day | ORAL | Status: DC

## 2011-09-22 NOTE — Telephone Encounter (Signed)
pts appts made and printed for pt aom °

## 2011-09-22 NOTE — Progress Notes (Signed)
OFFICE PROGRESS NOTE   INTERVAL HISTORY:   He returns as scheduled. No new complaint. He continues to have neuropathy symptoms in the hands and feet. He has an intermittent "throbbing" sensation in the fingers and toes. No pain. He decided against a second opinion after the last office visit. He continues daily Lovenox.    Vital signs in last 24 hours:  Blood pressure 132/82, pulse 63, temperature 97.8 F (36.6 C), temperature source Oral, height 5' 9.5" (1.765 m), weight 172 lb (78.019 kg).    HEENT:  neck without mass Lymphatics:  no cervical, supraclavicular, axillary, or inguinal nodes Resp:  lungs clear bilaterally Cardio:  regular rate and rhythm GI:  nontender, no mass, no hepatomegaly Vascular:  no leg edema    Portacath/PICC-without erythema   Medications: I have reviewed the patient's current medications.  Assessment/Plan: #1-Stage III (T4, N2) adenocarcinoma of the right colon, status post a right colectomy 11/25/2010.  He began adjuvant FOLFOX chemotherapy 12/31/2010. He completed cycle 12 of FOLFOX on 06/03/2011.  #2-PET scan 12/29/2010 with hypermetabolic retroperitoneal and anterior abdominal adenopathy, postoperative changes of the right colectomy with a possible seroma in the anterior abdomen. Patchy activity was noted in the parotid glands, potentially inflammatory in etiology.  -The hypermetabolic retroperitoneal lymph nodes potentially represented metastatic disease. We presented his case at the GI tumor conference and discussed the case with Dr. Dwain Sarna. A decision was made to proceed with "adjuvant" therapy with the plan for a restaging CT at a 3 to four-month interval.  -The restaging CT on 03/23/2011 revealed a decrease in the periaortic lymphadenopathy, no new adenopathy, and no evidence of liver metastases. This suggests the small retroperitoneal lymph nodes may represent metastatic colon cancer versus resolving "inflammatory" nodes.  -Restaging CT  07/13/2011 revealed increased retroperitoneal lymphadenopathy consistent with metastatic disease.  -Restaging PET scan 07/22/2011 confirmed persistent hypermetabolic retroperitoneal lymph nodes.  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 He reports recent removal of another basal cell carcinoma from a right finger by Dr. Karlyn Agee.  #6-Left leg deep vein thrombosis confirmed on a Doppler ultrasound 01/28/2011, maintained on daily Lovenox.  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 He now has significant numbness in the fingers and toes. #8-Mild neutropenia on 03/11/2011, he received Neulasta following cycle #6 of FOLFOX. He also received Neulasta support following cycle 8, cycle 10 and cycle #12.  #9-Thrombocytopenia secondary to chemotherapy-resolved.   Disposition:   He appears stable. He is asymptomatic from the metastatic colon cancer. I discussed treatment and followup options with the patient and his wife. He does not wish to pursue a second medical oncology opinion for now. We discussed the "standard" salvage chemotherapy options.  He will return for a restaging CT evaluation at the end of June with an office visit scheduled for 11/16/2011. The Port-A-Cath will be flushed every 6 weeks.  He has been maintained on daily Lovenox since being diagnosed with a deep vein thrombosis in September of 2012. It is becoming more difficult to administer a daily injection to limited subcutaneous tissue at the abdominal wall. He appears to have a limited tumor burden and has not "failed" Coumadin. The DVT occurred relatively close to the abdominal surgery and at the onset of chemotherapy. We decided to make a switch to Coumadin anticoagulation. He will begin Coumadin  at a  dose of 5 mg daily. He has been referred to the cancer Center Coumadin clinic.   Thornton Papas, MD  09/22/2011  6:53 PM

## 2011-09-23 ENCOUNTER — Telehealth: Payer: Self-pay | Admitting: *Deleted

## 2011-09-23 ENCOUNTER — Other Ambulatory Visit: Payer: Self-pay | Admitting: Pharmacist

## 2011-09-23 DIAGNOSIS — I82409 Acute embolism and thrombosis of unspecified deep veins of unspecified lower extremity: Secondary | ICD-10-CM | POA: Insufficient documentation

## 2011-09-23 NOTE — Telephone Encounter (Signed)
Call from pt, he did not begin Coumadin on 09/22/11. Picking up med today, asks if he should keep Coumadin Clinic appt on 09/25/11. Reviewed with pharmacists: they will contact pt to reschedule to 5/13.

## 2011-09-25 ENCOUNTER — Other Ambulatory Visit: Payer: BC Managed Care – PPO | Admitting: Lab

## 2011-09-25 ENCOUNTER — Ambulatory Visit: Payer: BC Managed Care – PPO

## 2011-09-28 ENCOUNTER — Ambulatory Visit (HOSPITAL_BASED_OUTPATIENT_CLINIC_OR_DEPARTMENT_OTHER): Payer: BC Managed Care – PPO | Admitting: Pharmacist

## 2011-09-28 ENCOUNTER — Other Ambulatory Visit (HOSPITAL_BASED_OUTPATIENT_CLINIC_OR_DEPARTMENT_OTHER): Payer: BC Managed Care – PPO | Admitting: Lab

## 2011-09-28 DIAGNOSIS — I82409 Acute embolism and thrombosis of unspecified deep veins of unspecified lower extremity: Secondary | ICD-10-CM

## 2011-09-28 LAB — PROTIME-INR: INR: 1.1 — ABNORMAL LOW (ref 2.00–3.50)

## 2011-09-28 NOTE — Progress Notes (Signed)
INR = 1.1 on Coumadin 5 mg/day; has been on Coumadin since 5/8 (6 doses).   Continues on SQ Lovenox daily until INR at goal.  Pt has plenty of Lovenox inj at home. In our initial visit today pt reports that his wife makes him Nutra Bullet supplements most days out of the week.  These are "applesauce" consistency & she makes them with Kale, spinach & other vit-K containing green vegetables.  The ingredients change throughout the week but do include green vegetables each time he has them. He also has a protein shake (made with Cryo Sport whey protein powder he purchases from [employer]).  His medications include ASA 325 mg/day.  He takes this as prevention since he has h/o Lynch syndrome or hereditary nonpolyposis colorectal cancer.   He also takes glucosamine-chondroitin - 2 tabs/day & Krill Oil (fish oil) 300 mg/day.  These 3 medications may all increase his risk of bleeding.  Glucosamine-chondroitin has potential to increase INR. The pt drinks daily (usually 2 "hard" drinks/day & 1-2 beers). A/P:  INR is subtherapeutic.  I will increase his Coumadin to 5 mg/day; 7.5 mg MWF.  He will take additional 1/2 tab today since he has already taken 5 mg this AM. Continue on Lovenox until INR = 2-3. A full discussion of the nature of anticoagulants has been carried out.  A benefit risk analysis has been presented to the patient, so that he understands the justification for converting from Lovenox to Coumadin tx. The need for frequent and regular monitoring, precise dosage adjustment and compliance was stressed.  Side effects of potential bleeding were discussed.   In terms of his dietary supplements, I recommended he maintain his routine but he should avoid great swings in general & avoid heavy alcohol consumption.  He was given a vit K content table that he will give his wife so she can determine how to keep the vit K containing vegetables consistent in his diet.   He knows to call with any signs of abnormal bleeding.    Next PT/INR test in 4 days.  Marily Lente, Pharm.D.

## 2011-10-02 ENCOUNTER — Other Ambulatory Visit (HOSPITAL_BASED_OUTPATIENT_CLINIC_OR_DEPARTMENT_OTHER): Payer: BC Managed Care – PPO | Admitting: Lab

## 2011-10-02 ENCOUNTER — Ambulatory Visit (HOSPITAL_BASED_OUTPATIENT_CLINIC_OR_DEPARTMENT_OTHER): Payer: BC Managed Care – PPO | Admitting: Pharmacist

## 2011-10-02 DIAGNOSIS — I82409 Acute embolism and thrombosis of unspecified deep veins of unspecified lower extremity: Secondary | ICD-10-CM

## 2011-10-02 LAB — POCT INR: INR: 1.2

## 2011-10-02 NOTE — Progress Notes (Signed)
INR = 1.2 (very little rise after dose increased 4 days ago). Current Coumadin dose is 5 mg/day; 7.5 mg MWF along w/ Lovenox until INR therapeutic. Pt had fewer protein shakes this week.  He did give his wife the handout on vit K & diet. I will increase his Coumadin to 7.5 mg/day.  Continue Lovenox. Return next week (5/22) for repeat protime. Pt has our contact info if he should have problems before then. Marily Lente, Pharm.D.

## 2011-10-07 ENCOUNTER — Other Ambulatory Visit (HOSPITAL_BASED_OUTPATIENT_CLINIC_OR_DEPARTMENT_OTHER): Payer: BC Managed Care – PPO | Admitting: Lab

## 2011-10-07 ENCOUNTER — Ambulatory Visit (HOSPITAL_BASED_OUTPATIENT_CLINIC_OR_DEPARTMENT_OTHER): Payer: BC Managed Care – PPO | Admitting: Pharmacist

## 2011-10-07 DIAGNOSIS — I82409 Acute embolism and thrombosis of unspecified deep veins of unspecified lower extremity: Secondary | ICD-10-CM

## 2011-10-07 LAB — PROTIME-INR
INR: 1.5 — ABNORMAL LOW (ref 2.00–3.50)
Protime: 18 Seconds — ABNORMAL HIGH (ref 10.6–13.4)

## 2011-10-07 NOTE — Progress Notes (Signed)
INR subtherapeutic today (1.5).  Pt continues on Lovenox and Coumadin 7.5mg  daily.  No missed doses.  No complaints.  No changes in the amount of VitaMix shakes he makes (3/week - each contain kale and spinach).  Will have pt continue Lovenox and increase Coumadin dose by 30% to 10mg  daily.  Will call in new Rx to his [facility] pharmacy at [facility] [address] (Ph# [phone]) for 10mg  tablets per pt request.  Pt will be going to the beach in the first week of June, so would prefer to try and get his Coumadin therapeutic prior to his vacation.  Plan to recheck INR on Tuesday, 10/13/11.  If subtherapeutic, we can check INR again on Friday.

## 2011-10-13 ENCOUNTER — Ambulatory Visit: Payer: BC Managed Care – PPO | Admitting: Pharmacist

## 2011-10-13 ENCOUNTER — Other Ambulatory Visit (HOSPITAL_BASED_OUTPATIENT_CLINIC_OR_DEPARTMENT_OTHER): Payer: BC Managed Care – PPO | Admitting: Lab

## 2011-10-13 DIAGNOSIS — I82409 Acute embolism and thrombosis of unspecified deep veins of unspecified lower extremity: Secondary | ICD-10-CM

## 2011-10-13 LAB — PROTIME-INR: Protime: 38.4 Seconds — ABNORMAL HIGH (ref 10.6–13.4)

## 2011-10-13 NOTE — Patient Instructions (Signed)
Stop Lovenox shots.  Continue 10 mg daily.  Return Friday 5/31 at 8:15 am for lab; 8:30 am for Coumadin clinic

## 2011-10-13 NOTE — Progress Notes (Signed)
Stop Lovenox shots.  Continue 10 mg daily.  Return Friday 5/31 at 8:15 am for lab; 8:30 am for Coumadin clinic 

## 2011-10-14 ENCOUNTER — Telehealth: Payer: Self-pay | Admitting: Oncology

## 2011-10-14 NOTE — Telephone Encounter (Signed)
called pts and informed him that the flush dept will be in a meeting @ 8am and asked if he would like to keep appt on 05/30 at 8am.  Pt stated that he will keep flush appt on 05/30

## 2011-10-15 ENCOUNTER — Other Ambulatory Visit: Payer: BC Managed Care – PPO | Admitting: Lab

## 2011-10-15 ENCOUNTER — Ambulatory Visit (HOSPITAL_BASED_OUTPATIENT_CLINIC_OR_DEPARTMENT_OTHER): Payer: BC Managed Care – PPO

## 2011-10-15 VITALS — BP 128/72 | HR 62 | Temp 98.2°F

## 2011-10-15 DIAGNOSIS — Z452 Encounter for adjustment and management of vascular access device: Secondary | ICD-10-CM

## 2011-10-15 DIAGNOSIS — C189 Malignant neoplasm of colon, unspecified: Secondary | ICD-10-CM

## 2011-10-15 MED ORDER — SODIUM CHLORIDE 0.9 % IJ SOLN
10.0000 mL | INTRAMUSCULAR | Status: DC | PRN
  Administered 2011-10-15: 10 mL via INTRAVENOUS
  Filled 2011-10-15: qty 10

## 2011-10-15 MED ORDER — HEPARIN SOD (PORK) LOCK FLUSH 100 UNIT/ML IV SOLN
500.0000 [IU] | Freq: Once | INTRAVENOUS | Status: AC
  Administered 2011-10-15: 500 [IU] via INTRAVENOUS
  Filled 2011-10-15: qty 5

## 2011-10-16 ENCOUNTER — Other Ambulatory Visit (HOSPITAL_BASED_OUTPATIENT_CLINIC_OR_DEPARTMENT_OTHER): Payer: BC Managed Care – PPO | Admitting: Lab

## 2011-10-16 ENCOUNTER — Ambulatory Visit (HOSPITAL_BASED_OUTPATIENT_CLINIC_OR_DEPARTMENT_OTHER): Payer: BC Managed Care – PPO | Admitting: Pharmacist

## 2011-10-16 DIAGNOSIS — C189 Malignant neoplasm of colon, unspecified: Secondary | ICD-10-CM

## 2011-10-16 DIAGNOSIS — I82409 Acute embolism and thrombosis of unspecified deep veins of unspecified lower extremity: Secondary | ICD-10-CM

## 2011-10-16 LAB — PROTIME-INR: Protime: 24 Seconds — ABNORMAL HIGH (ref 10.6–13.4)

## 2011-10-16 NOTE — Progress Notes (Signed)
INR = 2 on 10 mg/day Pt may have changed the days he has his supplemental shake (high in vit K) this week but generally has 3 shakes/wk. Leaving for [employer] tomorrow for 1 week. Reminded pt about moderate EtOH consumption while on vacation.   He is taking his supplies for his supplemental shakes so he'll continue that routine. Cont 10 mg/day; INR at goal. Repeat lab in 2 weeks when back from the beach. Marily Lente, Pharm.D.

## 2011-10-17 LAB — COMPREHENSIVE METABOLIC PANEL
BUN: 21 mg/dL (ref 6–23)
CO2: 24 mEq/L (ref 19–32)
Creatinine, Ser: 1 mg/dL (ref 0.50–1.35)
Glucose, Bld: 95 mg/dL (ref 70–99)
Total Bilirubin: 0.4 mg/dL (ref 0.3–1.2)
Total Protein: 6.6 g/dL (ref 6.0–8.3)

## 2011-10-17 LAB — CEA: CEA: 1.4 ng/mL (ref 0.0–5.0)

## 2011-10-28 ENCOUNTER — Ambulatory Visit (HOSPITAL_BASED_OUTPATIENT_CLINIC_OR_DEPARTMENT_OTHER): Payer: BC Managed Care – PPO | Admitting: Pharmacist

## 2011-10-28 ENCOUNTER — Other Ambulatory Visit (HOSPITAL_BASED_OUTPATIENT_CLINIC_OR_DEPARTMENT_OTHER): Payer: BC Managed Care – PPO | Admitting: Lab

## 2011-10-28 DIAGNOSIS — I82409 Acute embolism and thrombosis of unspecified deep veins of unspecified lower extremity: Secondary | ICD-10-CM

## 2011-10-28 LAB — PROTIME-INR: INR: 2.2 (ref 2.00–3.50)

## 2011-10-28 NOTE — Progress Notes (Signed)
Pt has been on current coumadin dose since 10/07/10.  Will cont coumadin 10mg  daily and check PT/INR with next MD appt on 11/16/11.

## 2011-11-12 ENCOUNTER — Ambulatory Visit (HOSPITAL_COMMUNITY)
Admission: RE | Admit: 2011-11-12 | Discharge: 2011-11-12 | Disposition: A | Discharge status: Home / Self Care | Payer: BC Managed Care – PPO | Source: Ambulatory Visit | Attending: Oncology | Admitting: Oncology

## 2011-11-12 DIAGNOSIS — N400 Enlarged prostate without lower urinary tract symptoms: Secondary | ICD-10-CM | POA: Insufficient documentation

## 2011-11-12 DIAGNOSIS — R599 Enlarged lymph nodes, unspecified: Secondary | ICD-10-CM | POA: Insufficient documentation

## 2011-11-12 DIAGNOSIS — C189 Malignant neoplasm of colon, unspecified: Secondary | ICD-10-CM | POA: Insufficient documentation

## 2011-11-12 DIAGNOSIS — Z79899 Other long term (current) drug therapy: Secondary | ICD-10-CM | POA: Insufficient documentation

## 2011-11-12 MED ORDER — IOHEXOL 300 MG/ML  SOLN
100.0000 mL | Freq: Once | INTRAMUSCULAR | Status: AC | PRN
  Administered 2011-11-12: 100 mL via INTRAVENOUS

## 2011-11-13 ENCOUNTER — Encounter: Payer: Self-pay | Admitting: Gastroenterology

## 2011-11-16 ENCOUNTER — Ambulatory Visit (HOSPITAL_BASED_OUTPATIENT_CLINIC_OR_DEPARTMENT_OTHER): Payer: BC Managed Care – PPO | Admitting: Oncology

## 2011-11-16 ENCOUNTER — Ambulatory Visit: Payer: BC Managed Care – PPO | Admitting: Pharmacist

## 2011-11-16 ENCOUNTER — Other Ambulatory Visit: Payer: BC Managed Care – PPO | Admitting: Lab

## 2011-11-16 ENCOUNTER — Telehealth: Payer: Self-pay | Admitting: Oncology

## 2011-11-16 VITALS — BP 130/79 | HR 65 | Temp 98.9°F | Ht 69.5 in | Wt 171.6 lb

## 2011-11-16 DIAGNOSIS — I82409 Acute embolism and thrombosis of unspecified deep veins of unspecified lower extremity: Secondary | ICD-10-CM

## 2011-11-16 DIAGNOSIS — C189 Malignant neoplasm of colon, unspecified: Secondary | ICD-10-CM

## 2011-11-16 LAB — PROTIME-INR: INR: 2.2 (ref 2.00–3.50)

## 2011-11-16 LAB — POCT INR: INR: 2.2

## 2011-11-16 NOTE — Progress Notes (Signed)
No changes to report.  Eager to get cat scan results today. He has decreased is weekly "shake" count from 3 to 2.  He will RTC in 6 weeks with next ML appmt on 12/28/11.

## 2011-11-16 NOTE — Progress Notes (Signed)
[facility]    OFFICE PROGRESS NOTE   INTERVAL HISTORY:   He returns as scheduled. He feels well. He is exercising and continues to work. No difficulty with bowel or bladder function. The neuropathy symptoms are unchanged. There is numbness in the feet and fingers. He sometimes has difficulty holding objects.  Objective:  Vital signs in last 24 hours:  Blood pressure 130/79, pulse 65, temperature 98.9 F (37.2 C), temperature source Oral, height 5' 9.5" (1.765 m), weight 171 lb 9.6 oz (77.837 kg).    HEENT: Neck without mass Lymphatics: No cervical, supraclavicular, or inguinal nodes.? 1/2 cm mobile left axillary node Resp: Lungs clear bilaterally Cardio: Regular rate and rhythm, 1/6 systolic murmur GI: No hepatomegaly, no mass, nontender Vascular: No leg edema Neuro: The vibratory sense is intact at the fingertip bilaterally    Portacath/PICC-without erythema  Labs: PT/INR 2.2  X-rays: Restaging CT of the abdomen and pelvis on 11/12/2011-mild increase in retroperitoneal lymphadenopathy in the left para-aortic space with an index node measuring 14 mm compared to 10 mm on the previous study. A central small bowel mesentery node measures 12 mm compared to 10 mm on the prior exam. No evidence of liver metastases. No new sites of lymphadenopathy in the abdomen or pelvis.  Medications: I have reviewed the patient's current medications.  Assessment/Plan: 1-Stage III (T4, N2) adenocarcinoma of the right colon, status post a right colectomy 11/25/2010.  He began adjuvant FOLFOX chemotherapy 12/31/2010. He completed cycle 12 of FOLFOX on 06/03/2011.  #2-PET scan 12/29/2010 with hypermetabolic retroperitoneal and anterior abdominal adenopathy, postoperative changes of the right colectomy with a possible seroma in the anterior abdomen. Patchy activity was noted in the parotid glands, potentially inflammatory in etiology.  -The hypermetabolic retroperitoneal lymph nodes  potentially represented metastatic disease. We presented his case at the GI tumor conference and discussed the case with Dr. Dwain Sarna. A decision was made to proceed with "adjuvant" therapy with the plan for a restaging CT at a 3 to four-month interval.  -The restaging CT on 03/23/2011 revealed a decrease in the periaortic lymphadenopathy, no new adenopathy, and no evidence of liver metastases. This suggests the small retroperitoneal lymph nodes may represent metastatic colon cancer versus resolving "inflammatory" nodes.  -Restaging CT 07/13/2011 revealed increased retroperitoneal lymphadenopathy consistent with metastatic disease.  -Restaging PET scan 07/22/2011 confirmed persistent hypermetabolic retroperitoneal lymph nodes.               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 He reports recent removal of another basal cell carcinoma from a right finger by Dr. Karlyn Agee.  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 He now has significant numbness in the fingers and toes.  #8-Mild neutropenia on 03/11/2011, he received Neulasta following cycle #6 of FOLFOX. He also received Neulasta support following cycle 8, cycle 10 and cycle #12.  #9-Thrombocytopenia secondary to chemotherapy-resolved.   Disposition:  He appears stable. The restaging CT shows mild progression of retroperitoneal adenopathy. No other evidence of progressive metastatic disease. I discussed treatment options with Mr. Faciane and his wife. We  discussed  the indication for a lymph node biopsy. I do not recommend a biopsy unless there is a plan to initiate systemic therapy or intervene surgically. I offered him a referral for another surgical oncology/medical oncology opinion. He will contact us within the next few days if he would like to proceed with a third opinion.  He will return for an office visit and Port-A-Cath flush in 6 weeks.   Thornton Papas, MD  11/16/2011  7:12 PM

## 2011-11-16 NOTE — Telephone Encounter (Signed)
Gave pt appt for lab, coumadin and ML in August 2013

## 2011-11-16 NOTE — Progress Notes (Signed)
Patient reports his PAC was accessed and flushed on 11/12/11 when his CT scan was done.

## 2011-11-16 NOTE — Patient Instructions (Signed)
No changes to report.  Patient will continue taking 10 mg daily and return to clinic with next appmt to see Ossie Beltran on 12/28/11 at 9:00.

## 2011-12-01 ENCOUNTER — Telehealth: Payer: Self-pay | Admitting: *Deleted

## 2011-12-01 NOTE — Telephone Encounter (Signed)
They have researched his insurance policy and it will cover referral for 3rd opinion at [facility] and wish to proceed with this.

## 2011-12-15 ENCOUNTER — Telehealth: Payer: Self-pay | Admitting: *Deleted

## 2011-12-15 NOTE — Telephone Encounter (Signed)
Requests records be sent to [facility] for an appointment he has there. Asking if they can be emailed? Also needs scans on CD. Forwarded request to [employer] in [employer] [place]

## 2011-12-28 ENCOUNTER — Ambulatory Visit: Payer: BC Managed Care – PPO

## 2011-12-28 ENCOUNTER — Other Ambulatory Visit: Payer: Self-pay | Admitting: Pharmacist

## 2011-12-28 ENCOUNTER — Other Ambulatory Visit (HOSPITAL_BASED_OUTPATIENT_CLINIC_OR_DEPARTMENT_OTHER): Payer: BC Managed Care – PPO | Admitting: Lab

## 2011-12-28 ENCOUNTER — Telehealth: Payer: Self-pay | Admitting: Oncology

## 2011-12-28 ENCOUNTER — Ambulatory Visit (HOSPITAL_BASED_OUTPATIENT_CLINIC_OR_DEPARTMENT_OTHER): Payer: BC Managed Care – PPO | Admitting: Nurse Practitioner

## 2011-12-28 ENCOUNTER — Ambulatory Visit (HOSPITAL_BASED_OUTPATIENT_CLINIC_OR_DEPARTMENT_OTHER): Payer: BC Managed Care – PPO | Admitting: Pharmacist

## 2011-12-28 VITALS — BP 143/83 | HR 60 | Temp 98.7°F | Resp 18 | Ht 69.5 in | Wt 173.4 lb

## 2011-12-28 DIAGNOSIS — I82409 Acute embolism and thrombosis of unspecified deep veins of unspecified lower extremity: Secondary | ICD-10-CM

## 2011-12-28 DIAGNOSIS — C189 Malignant neoplasm of colon, unspecified: Secondary | ICD-10-CM

## 2011-12-28 LAB — POCT INR: INR: 2.8

## 2011-12-28 LAB — PROTIME-INR: Protime: 33.6 Seconds — ABNORMAL HIGH (ref 10.6–13.4)

## 2011-12-28 MED ORDER — HEPARIN SOD (PORK) LOCK FLUSH 100 UNIT/ML IV SOLN
500.0000 [IU] | Freq: Once | INTRAVENOUS | Status: AC
Start: 2011-12-28 — End: 2011-12-28
  Administered 2011-12-28: 500 [IU] via INTRAVENOUS
  Filled 2011-12-28: qty 5

## 2011-12-28 MED ORDER — WARFARIN SODIUM 10 MG PO TABS: 10.0000 mg | ORAL_TABLET | Freq: Every day | ORAL | Status: DC

## 2011-12-28 MED ORDER — SODIUM CHLORIDE 0.9 % IJ SOLN
10.0000 mL | INTRAMUSCULAR | Status: DC | PRN
  Administered 2011-12-28: 10 mL via INTRAVENOUS
  Filled 2011-12-28: qty 10

## 2011-12-28 NOTE — Progress Notes (Signed)
Continue 10 mg daily.   Return 02/09/12 at 8 am for lab; 8:15 am for Coumadin Clinic.  Will try to schedule Flush appointment on the same day.

## 2011-12-28 NOTE — Progress Notes (Signed)
OFFICE PROGRESS NOTE  Interval history:  Terry Vargas returns as scheduled. He feels well. He has a good appetite and good energy level. No nausea/vomiting. No abdominal pain. He continues to have mild numbness in the fingertips and toes. The numbness is slightly better. Over the past few weeks he has had intermittent low back pain. He did not notice the pain over the past 2 days. He thinks the pain may be related to his exercise regimen.   Objective: Blood pressure 143/83, pulse 60, temperature 98.7 F (37.1 C), temperature source Oral, resp. rate 18, height 5' 9.5" (1.765 m), weight 173 lb 6.4 oz (78.654 kg).  Oropharynx is without thrush or ulceration. No palpable cervical, supraclavicular or axillary lymph nodes. Lungs clear. Regular cardiac rhythm. Port-A-Cath site is without erythema. Abdomen is soft and nontender. No hepatomegaly. Extremities are without edema. Vibratory sense intact over the fingertips.  Lab Results: Lab Results  Component Value Date   WBC 5.5 07/15/2011   HGB 12.0* 07/15/2011   HCT 34.9* 07/15/2011   MCV 96.1 07/15/2011   PLT 177 07/15/2011    Chemistry:    Chemistry      Component Value Date/Time   NA 139 10/16/2011 0745   K 4.1 10/16/2011 0745   CL 105 10/16/2011 0745   CO2 24 10/16/2011 0745   BUN 21 10/16/2011 0745   CREATININE 1.00 10/16/2011 0745      Component Value Date/Time   CALCIUM 9.2 10/16/2011 0745   ALKPHOS 56 10/16/2011 0745   AST 38* 10/16/2011 0745   ALT 46 10/16/2011 0745   BILITOT 0.4 10/16/2011 0745       Studies/Results: No results found.  Medications: I have reviewed the patient's current medications.  Assessment/Plan:  1-Stage III (T4, N2) adenocarcinoma of the right colon, status post a right colectomy 11/25/2010.  He began adjuvant FOLFOX chemotherapy 12/31/2010. He completed cycle 12 of FOLFOX on 06/03/2011.  #2-PET scan 12/29/2010 with hypermetabolic retroperitoneal and anterior abdominal adenopathy, postoperative changes of the  right colectomy with a possible seroma in the anterior abdomen. Patchy activity was noted in the parotid glands, potentially inflammatory in etiology.  -The hypermetabolic retroperitoneal lymph nodes potentially represented metastatic disease. We presented his case at the GI tumor conference and discussed the case with Dr. Dwain Sarna. A decision was made to proceed with "adjuvant" therapy with the plan for a restaging CT at a 3 to four-month interval.  -The restaging CT on 03/23/2011 revealed a decrease in the periaortic lymphadenopathy, no new adenopathy, and no evidence of liver metastases. This suggests the small retroperitoneal lymph nodes may represent metastatic colon cancer versus resolving "inflammatory" nodes.  -Restaging CT 07/13/2011 revealed increased retroperitoneal lymphadenopathy consistent with metastatic disease.  -Restaging PET scan 07/22/2011 confirmed persistent hypermetabolic retroperitoneal lymph nodes. 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 He reports recent removal of another basal cell carcinoma from a right finger by Dr. Karlyn Agee.  #6-Left leg deep vein thrombosis confirmed on a Doppler ultrasound 01/28/2011, now maintained on Coumadin. The INR is therapeutic. He will continue Coumadin at the current dose.  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 He has persistent mild numbness in the fingertips and toes.  The numbness does not interfere with activity. #8-Mild neutropenia on 03/11/2011, he received Neulasta following cycle #6 of FOLFOX. He also received Neulasta support following cycle 8,  cycle 10 and cycle #12.  #9-Thrombocytopenia secondary to chemotherapy-resolved.   Disposition-Terry Vargas appears well. He has an appointment scheduled at [facility] 01/01/2012 for another surgical oncology/medical oncology opinion. He will return for a followup visit and Port-A-Cath flush in 6 weeks. He will contact the office in the interim with any problems.  Plan reviewed with Dr. Truett Perna.   Lonna Cobb ANP/GNP-BC

## 2011-12-28 NOTE — Patient Instructions (Signed)
See MD for problems 

## 2011-12-28 NOTE — Telephone Encounter (Signed)
Gave pt appt for September 2013 lab, MD , coumadin clinic and flush

## 2011-12-28 NOTE — Patient Instructions (Addendum)
Continue 10 mg daily.   Return 02/09/12 at 8 am for lab; 8:15 am for Coumadin Clinic.  Will try to schedule Flush appointment on the same day. 

## 2012-01-20 ENCOUNTER — Telehealth: Payer: Self-pay | Admitting: Oncology

## 2012-01-20 ENCOUNTER — Telehealth: Payer: Self-pay | Admitting: *Deleted

## 2012-01-20 ENCOUNTER — Other Ambulatory Visit: Payer: Self-pay | Admitting: *Deleted

## 2012-01-20 NOTE — Telephone Encounter (Signed)
Received call from pt c/o of abdominal/back pain that comes and goes; states "this is affecting my sleep."  Called and spoke with pt, per Dr. Truett Perna aware of symptoms and will schedule appt to see Terry Blow, NP Friday 01/22/12.  Pt verbalized understanding that scheduler will call to arrange appt time.

## 2012-01-20 NOTE — Telephone Encounter (Signed)
Talked to patient and he is aware of appt on 01/22/12 with ML

## 2012-01-21 ENCOUNTER — Telehealth: Payer: Self-pay | Admitting: *Deleted

## 2012-01-21 NOTE — Telephone Encounter (Signed)
Left patient voice message to inform the patient of the new date and time on 01-22-2012 at 10:15am with lisa Raeqwon

## 2012-01-22 ENCOUNTER — Telehealth: Payer: Self-pay | Admitting: Oncology

## 2012-01-22 ENCOUNTER — Ambulatory Visit (HOSPITAL_BASED_OUTPATIENT_CLINIC_OR_DEPARTMENT_OTHER): Payer: BC Managed Care – PPO | Admitting: Nurse Practitioner

## 2012-01-22 ENCOUNTER — Ambulatory Visit (HOSPITAL_BASED_OUTPATIENT_CLINIC_OR_DEPARTMENT_OTHER): Payer: BC Managed Care – PPO | Admitting: Lab

## 2012-01-22 VITALS — BP 137/77 | HR 60 | Temp 98.6°F | Resp 18 | Ht 69.5 in | Wt 171.0 lb

## 2012-01-22 DIAGNOSIS — R109 Unspecified abdominal pain: Secondary | ICD-10-CM

## 2012-01-22 DIAGNOSIS — C182 Malignant neoplasm of ascending colon: Secondary | ICD-10-CM

## 2012-01-22 DIAGNOSIS — M549 Dorsalgia, unspecified: Secondary | ICD-10-CM

## 2012-01-22 DIAGNOSIS — M545 Low back pain: Secondary | ICD-10-CM

## 2012-01-22 DIAGNOSIS — C189 Malignant neoplasm of colon, unspecified: Secondary | ICD-10-CM

## 2012-01-22 DIAGNOSIS — I82409 Acute embolism and thrombosis of unspecified deep veins of unspecified lower extremity: Secondary | ICD-10-CM

## 2012-01-22 LAB — COMPREHENSIVE METABOLIC PANEL (CC13)
Albumin: 3.8 g/dL (ref 3.5–5.0)
Alkaline Phosphatase: 72 U/L (ref 40–150)
BUN: 13 mg/dL (ref 7.0–26.0)
Calcium: 9.1 mg/dL (ref 8.4–10.4)
Chloride: 104 mEq/L (ref 98–107)
Glucose: 100 mg/dl — ABNORMAL HIGH (ref 70–99)
Potassium: 4 mEq/L (ref 3.5–5.1)

## 2012-01-22 LAB — CEA: CEA: 1.9 ng/mL (ref 0.0–5.0)

## 2012-01-22 MED ORDER — OXYCODONE HCL 5 MG PO TABS: 5.0000 mg | ORAL_TABLET | ORAL | Status: DC | PRN

## 2012-01-22 NOTE — Telephone Encounter (Signed)
gve the pt his dec 2013 appt calendar along with the ct scan appt with instructions.

## 2012-01-22 NOTE — Progress Notes (Signed)
OFFICE PROGRESS NOTE  Interval history:  [name] returns prior to scheduled followup for evaluation of back pain. He reports a several week history of low back and low abdominal pain. He describes the pain as "aching". Over the past 3-4 days the pain has been affecting his sleep. He has started taking 5 mg of oxycodone at bedtime. This has allowed him to sleep through the night. He does not take pain medication during the day but does notice the pain. The pain does not radiate. He denies any known injury. No unusual activities. No nausea or vomiting. Bowels moving regularly. No urinary symptoms.   Objective: Blood pressure 137/77, pulse 60, temperature 98.6 F (37 C), temperature source Oral, resp. rate 18, height 5' 9.5" (1.765 m), weight 171 lb (77.565 kg).  Lungs are clear. Regular cardiac rhythm. Port-A-Cath site is without erythema. Abdomen is soft and nontender. No hepatomegaly. Extremities are without edema. He is nontender over the lower back and lower abdomen. No skin rash. Motor strength 5 over 5.  Lab Results: Lab Results  Component Value Date   WBC 5.5 07/15/2011   HGB 12.0* 07/15/2011   HCT 34.9* 07/15/2011   MCV 96.1 07/15/2011   PLT 177 07/15/2011    Chemistry:    Chemistry      Component Value Date/Time   NA 139 10/16/2011 0745   K 4.1 10/16/2011 0745   CL 105 10/16/2011 0745   CO2 24 10/16/2011 0745   BUN 21 10/16/2011 0745   CREATININE 1.00 10/16/2011 0745      Component Value Date/Time   CALCIUM 9.2 10/16/2011 0745   ALKPHOS 56 10/16/2011 0745   AST 38* 10/16/2011 0745   ALT 46 10/16/2011 0745   BILITOT 0.4 10/16/2011 0745       Studies/Results: No results found.  Medications: I have reviewed the patient's current medications.  Assessment/Plan:  1-Stage III (T4, N2) adenocarcinoma of the right colon, status post a right colectomy 11/25/2010.  He began adjuvant FOLFOX chemotherapy 12/31/2010. He completed cycle 12 of FOLFOX on 06/03/2011.  #2-PET scan  12/29/2010 with hypermetabolic retroperitoneal and anterior abdominal adenopathy, postoperative changes of the right colectomy with a possible seroma in the anterior abdomen. Patchy activity was noted in the parotid glands, potentially inflammatory in etiology.  -The hypermetabolic retroperitoneal lymph nodes potentially represented metastatic disease. We presented his case at the GI tumor conference and discussed the case with Dr. Dwain Sarna. A decision was made to proceed with "adjuvant" therapy with the plan for a restaging CT at a 3 to four-month interval.  -The restaging CT on 03/23/2011 revealed a decrease in the periaortic lymphadenopathy, no new adenopathy, and no evidence of liver metastases. This suggests the small retroperitoneal lymph nodes may represent metastatic colon cancer versus resolving "inflammatory" nodes.  -Restaging CT 07/13/2011 revealed increased retroperitoneal lymphadenopathy consistent with metastatic disease.  -Restaging PET scan 07/22/2011 confirmed persistent hypermetabolic retroperitoneal lymph nodes. 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 A basal cell carcinoma was recently removed from a right finger by Dr. Karlyn Agee.  #6-Left leg deep vein thrombosis confirmed on a Doppler ultrasound 01/28/2011, now maintained on Coumadin. 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 He  has persistent mild numbness in the fingertips and toes. The numbness does not interfere with activity.  #8-Mild neutropenia on 03/11/2011, he received Neulasta following cycle #6 of FOLFOX. He also received Neulasta  support following cycle 8, cycle 10 and cycle #12.  #9-Thrombocytopenia secondary to chemotherapy-resolved.  #10-2 week history of "aching" low back and low abdominal pain.  Disposition-the etiology of the pain he is experiencing is unclear. We are referring him for restaging CT scans of the abdomen and pelvis to followup the known retroperitoneal adenopathy. He was given a prescription for oxycodone 5 mg every 6 hours as needed for pain. At his request we will contact him with the CT result by phone and he will keep his scheduled followup visit on 02/09/2012.  Patient seen with Dr. Truett Perna.   Lonna Cobb ANP/GNP-BC

## 2012-01-27 ENCOUNTER — Ambulatory Visit (HOSPITAL_COMMUNITY)
Admission: RE | Admit: 2012-01-27 | Discharge: 2012-01-27 | Disposition: A | Discharge status: Home / Self Care | Payer: BC Managed Care – PPO | Source: Ambulatory Visit | Attending: Nurse Practitioner | Admitting: Nurse Practitioner

## 2012-01-27 DIAGNOSIS — M549 Dorsalgia, unspecified: Secondary | ICD-10-CM | POA: Insufficient documentation

## 2012-01-27 DIAGNOSIS — R599 Enlarged lymph nodes, unspecified: Secondary | ICD-10-CM | POA: Insufficient documentation

## 2012-01-27 DIAGNOSIS — C189 Malignant neoplasm of colon, unspecified: Secondary | ICD-10-CM

## 2012-01-27 MED ORDER — IOHEXOL 300 MG/ML  SOLN
100.0000 mL | Freq: Once | INTRAMUSCULAR | Status: AC | PRN
  Administered 2012-01-27: 100 mL via INTRAVENOUS

## 2012-02-02 ENCOUNTER — Telehealth: Payer: Self-pay | Admitting: Nurse Practitioner

## 2012-02-02 NOTE — Telephone Encounter (Signed)
Informed pt per Dr. Truett Perna- CT scan shows "no evidence of new metastatic disease, previous nodes are still present.  1 is slightly larger, others are stable or slightly smaller.  No clear explanation for the pain."  Pt states pain still persists.  Not worse, not better.  Reports relief with nightly oxycodone.  Instructed pt to call for worsening pain, otherwise, f/u as scheduled.  Pt verbalized understanding.

## 2012-02-09 ENCOUNTER — Encounter: Payer: Self-pay | Admitting: *Deleted

## 2012-02-09 ENCOUNTER — Other Ambulatory Visit (HOSPITAL_BASED_OUTPATIENT_CLINIC_OR_DEPARTMENT_OTHER): Payer: BC Managed Care – PPO

## 2012-02-09 ENCOUNTER — Ambulatory Visit: Payer: BC Managed Care – PPO | Admitting: Pharmacist

## 2012-02-09 ENCOUNTER — Ambulatory Visit (HOSPITAL_BASED_OUTPATIENT_CLINIC_OR_DEPARTMENT_OTHER): Payer: BC Managed Care – PPO | Admitting: Oncology

## 2012-02-09 ENCOUNTER — Telehealth: Payer: Self-pay | Admitting: Oncology

## 2012-02-09 VITALS — BP 141/84 | HR 58 | Temp 96.8°F | Resp 18 | Ht 69.5 in | Wt 172.6 lb

## 2012-02-09 DIAGNOSIS — Z7901 Long term (current) use of anticoagulants: Secondary | ICD-10-CM

## 2012-02-09 DIAGNOSIS — Z1509 Genetic susceptibility to other malignant neoplasm: Secondary | ICD-10-CM

## 2012-02-09 DIAGNOSIS — I82409 Acute embolism and thrombosis of unspecified deep veins of unspecified lower extremity: Secondary | ICD-10-CM

## 2012-02-09 DIAGNOSIS — C182 Malignant neoplasm of ascending colon: Secondary | ICD-10-CM

## 2012-02-09 DIAGNOSIS — I824Z9 Acute embolism and thrombosis of unspecified deep veins of unspecified distal lower extremity: Secondary | ICD-10-CM

## 2012-02-09 DIAGNOSIS — C189 Malignant neoplasm of colon, unspecified: Secondary | ICD-10-CM

## 2012-02-09 LAB — POCT INR: INR: 2.9

## 2012-02-09 LAB — PROTIME-INR
INR: 2.9 (ref 2.00–3.50)
Protime: 34.8 Seconds — ABNORMAL HIGH (ref 10.6–13.4)

## 2012-02-09 MED ORDER — OXYCODONE HCL 5 MG PO TABS: 5.0000 mg | ORAL_TABLET | ORAL | Status: DC | PRN

## 2012-02-09 NOTE — Telephone Encounter (Signed)
Gave pt appt for MD on 03/08/12 , waiting for coumadin clinic date and time

## 2012-02-09 NOTE — Patient Instructions (Signed)
Continue 10mg  daily.   Recheck INR in 1 month with scheduled MD appointment on 10/22.  Lab is at 8am, Coumadin clinic at 8:15am and Dr. Truett Perna appointment at 8:30am.

## 2012-02-09 NOTE — Progress Notes (Signed)
Continue 10mg  daily.   Recheck INR in 1 month with scheduled MD appointment on 10/22.  Lab is at 8am, Coumadin clinic at 8:15am and Dr. Truett Perna appointment at 8:30am.  Pt occasionally uses ibuprofen. He plans to switch to Tylenol secondary to it being a safer option with Coumadin. Last LFT's ok. (AST on 01/22/12 was mildly elevated at 38.)

## 2012-02-09 NOTE — Progress Notes (Signed)
[facility]    OFFICE PROGRESS NOTE   INTERVAL HISTORY:   He returns as scheduled. He continues to have pain in the low back. The pain is chiefly present in the evening. He takes 1 oxycodone tablet each night for relief of pain. No other pain. He otherwise feels well. The neuropathy symptoms in the feet are unchanged. Terry Vargas continues to exercise regularly.  Objective:  Vital signs in last 24 hours:  Blood pressure 141/84, pulse 58, temperature 96.8 F (36 C), temperature source Oral, resp. rate 18, height 5' 9.5" (1.765 m), weight 172 lb 9.6 oz (78.291 kg).    HEENT: Neck without mass Lymphatics: No cervical, supraclavicular, or axillary node Resp: Lungs clear bilaterally Cardio: Regular rate and rhythm GI: No hepatomegaly, nontender, no mass Vascular: No leg edema, the left lower leg is slightly larger than the right side  Portacath/PICC-without erythema  X-rays: CT of the abdomen and pelvis on 01/27/2012-compared to 11/12/2011: No focal hepatic lesion. The lung bases are clear. Enlarged periaortic and celiac lymph nodes are again seen. 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 Left para-aortic nodes are slightly decreased in size. 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  Medications: I have reviewed the patient's current medications.  Assessment/Plan: #1-Stage III (T4, N2) adenocarcinoma of the right colon, status post a right colectomy 11/25/2010.  He began adjuvant FOLFOX chemotherapy 12/31/2010. He completed cycle 12 of FOLFOX on 06/03/2011.  #2-PET scan 12/29/2010 with hypermetabolic retroperitoneal and anterior abdominal adenopathy, postoperative changes of the right colectomy with a possible seroma in the anterior abdomen. Patchy activity was noted in the parotid glands, potentially inflammatory in etiology.  -The hypermetabolic  retroperitoneal lymph nodes potentially represented metastatic disease. We presented his case at the GI tumor conference and discussed the case with Dr. Dwain Sarna. A decision was made to proceed with "adjuvant" therapy with the plan for a restaging CT at a 3 to four-month interval.  -The restaging CT on 03/23/2011 revealed a decrease in the periaortic lymphadenopathy, no new adenopathy, and no evidence of liver metastases. This suggests the small retroperitoneal lymph nodes may represent metastatic colon cancer versus resolving "inflammatory" nodes.  -Restaging CT 07/13/2011 revealed increased retroperitoneal lymphadenopathy consistent with metastatic disease.  -Restaging PET scan 07/22/2011 confirmed persistent hypermetabolic retroperitoneal lymph nodes. 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 A basal cell carcinoma was recently removed from a right finger by Dr. Karlyn Agee.  #6-Left leg deep vein thrombosis confirmed on a Doppler ultrasound 01/28/2011, now maintained on Coumadin.  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 He has persistent mild numbness in the fingertips and toes. The numbness does not interfere with activity.  #8-Mild neutropenia on 03/11/2011, he received Neulasta following cycle #6 of FOLFOX. He also received Neulasta support following cycle 8, cycle 10 and cycle #12.  #9-Thrombocytopenia secondary to chemotherapy-resolved.  #10-low back pain-most likely related to the  necrotic  celiac node  Disposition:  He continues to have low back pain. The pain is most likely related to the enlarged celiac lymph node. I reviewed the 01/27/2012 CT with a [facility] radiologist. There is no other explanation for the pain. I discussed the CT findings with Terry Vargas and his wife. I recommended palliative radiation. He would like to continue an observation approach for now. He will contact us for increased pain or new symptoms.  We also discussed the [employer] tissue bank study. He will consider enrollment on the clinical trial.  He will return for an office visit and Port-A-Cath flush in approximately 4 weeks. We reviewed the [facility] visit and their recommendation for observation.   Thornton Papas, MD  02/09/2012  6:17 PM

## 2012-02-12 ENCOUNTER — Telehealth: Payer: Self-pay | Admitting: *Deleted

## 2012-02-12 NOTE — Telephone Encounter (Signed)
Made patient aware that BRAF testing was negative, which is good indicator. He was appreciative for call. Wants MD aware that he would like to pursue seeing radiation oncology MD regarding possible radiation of lymph node. Abdominal pain is not going away.

## 2012-02-15 ENCOUNTER — Other Ambulatory Visit: Payer: Self-pay | Admitting: *Deleted

## 2012-02-15 ENCOUNTER — Telehealth: Payer: Self-pay | Admitting: Oncology

## 2012-02-15 DIAGNOSIS — C189 Malignant neoplasm of colon, unspecified: Secondary | ICD-10-CM

## 2012-02-15 NOTE — Telephone Encounter (Signed)
Gave pt appt for Radiation Oncology for October 2nd with Dr. Mitzi Hansen, @ 3:30 pm, he is aware of a location , date and time

## 2012-02-15 NOTE — Progress Notes (Signed)
Referral for Dr. Mitzi Hansen in radiation oncology ordered per Dr. Truett Perna.

## 2012-02-17 ENCOUNTER — Telehealth: Payer: Self-pay | Admitting: *Deleted

## 2012-02-17 ENCOUNTER — Encounter: Payer: Self-pay | Admitting: Radiation Oncology

## 2012-02-17 ENCOUNTER — Ambulatory Visit
Admission: RE | Admit: 2012-02-17 | Discharge: 2012-02-17 | Disposition: A | Discharge status: Home / Self Care | Payer: BC Managed Care – PPO | Source: Ambulatory Visit | Attending: Radiation Oncology | Admitting: Radiation Oncology

## 2012-02-17 VITALS — BP 137/85 | HR 60 | Temp 98.4°F | Resp 20 | Wt 170.8 lb

## 2012-02-17 DIAGNOSIS — Z8051 Family history of malignant neoplasm of kidney: Secondary | ICD-10-CM | POA: Insufficient documentation

## 2012-02-17 DIAGNOSIS — Z8042 Family history of malignant neoplasm of prostate: Secondary | ICD-10-CM | POA: Insufficient documentation

## 2012-02-17 DIAGNOSIS — Z86718 Personal history of other venous thrombosis and embolism: Secondary | ICD-10-CM | POA: Insufficient documentation

## 2012-02-17 DIAGNOSIS — Z808 Family history of malignant neoplasm of other organs or systems: Secondary | ICD-10-CM | POA: Insufficient documentation

## 2012-02-17 DIAGNOSIS — Z79899 Other long term (current) drug therapy: Secondary | ICD-10-CM | POA: Insufficient documentation

## 2012-02-17 DIAGNOSIS — Z510 Encounter for antineoplastic radiation therapy: Secondary | ICD-10-CM | POA: Insufficient documentation

## 2012-02-17 DIAGNOSIS — Z9049 Acquired absence of other specified parts of digestive tract: Secondary | ICD-10-CM | POA: Insufficient documentation

## 2012-02-17 DIAGNOSIS — Z7982 Long term (current) use of aspirin: Secondary | ICD-10-CM | POA: Insufficient documentation

## 2012-02-17 DIAGNOSIS — C189 Malignant neoplasm of colon, unspecified: Secondary | ICD-10-CM

## 2012-02-17 DIAGNOSIS — C4490 Unspecified malignant neoplasm of skin, unspecified: Secondary | ICD-10-CM | POA: Insufficient documentation

## 2012-02-17 DIAGNOSIS — Z9221 Personal history of antineoplastic chemotherapy: Secondary | ICD-10-CM | POA: Insufficient documentation

## 2012-02-17 DIAGNOSIS — Z85038 Personal history of other malignant neoplasm of large intestine: Secondary | ICD-10-CM | POA: Insufficient documentation

## 2012-02-17 DIAGNOSIS — C772 Secondary and unspecified malignant neoplasm of intra-abdominal lymph nodes: Secondary | ICD-10-CM | POA: Insufficient documentation

## 2012-02-17 DIAGNOSIS — Z800 Family history of malignant neoplasm of digestive organs: Secondary | ICD-10-CM | POA: Insufficient documentation

## 2012-02-17 HISTORY — DX: Acute embolism and thrombosis of unspecified deep veins of unspecified lower extremity: I82.409

## 2012-02-17 HISTORY — DX: Unspecified malignant neoplasm of skin, unspecified: C44.90

## 2012-02-17 HISTORY — DX: Cardiac murmur, unspecified: R01.1

## 2012-02-17 NOTE — Telephone Encounter (Signed)
Called and lmonvm with hospital billing # of [phone] and to call if any questions.

## 2012-02-17 NOTE — Progress Notes (Signed)
Please see the Nurse Progress Note in the MD Initial Consult Encounter for this patient. 

## 2012-02-17 NOTE — Progress Notes (Signed)
Radiation Oncology         [phone] ________________________________  Name: Terry Vargas MRN: [id number]  Date: 02/17/2012  DOB: Sep 18, 1960  FT:DDUKGUR,KYHCWC [name], MD  Ladene Artist, MD     REFERRING PHYSICIAN: Ladene Artist, MD   DIAGNOSIS: Metastatic colon cancer  HISTORY OF PRESENT ILLNESS::Terry Vargas is a 51 y.o. male who is seen for an initial consultation visit. The patient comes in today for consideration of palliative radiotherapy in light of some back pain. 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 He proceeded with FOLFOX chemotherapy adjuvantly. He finished his final cycle in January of 2013. He has been followed with a number of imaging studies since that time. As far back as a PET scan in August of 2012, there was hypermetabolic retroperitoneal and anterior abdominal adenopathy. Subsequent restaging study in November of 2012 revealed a decrease in the periaortic lymphadenopathy with no new adenopathy. This has slowly increased in terms of the retroperitoneal lymphadenopathy on subsequent scans including in February 2013 and subsequent PET scan in March of 2013. His most recent scan was completed on 01/27/2012. This again showed enlarged periaortic and celiac lymph nodes. The largest tumor represents a necrotic celiac lymph node measuring 27 mm which is increased from 18 mm previously. Several other smaller lymph nodes are present nearby.  The patient states that he has been experiencing some back pain which wraps around to the sides to getting possibly in late July. Initially this happened only at night but this has become more frequent and is something that he notices now earlier in the day. He discussed this with Dr. Truett Perna but wanted to hold off and see if this improved. However after a couple of weeks this pain has been persistent. This does not seem to be related to any other  activities and the patient has no other areas of pain. Therefore I been asked to see him for consideration of palliative radiotherapy.   PREVIOUS RADIATION THERAPY: No   PAST MEDICAL HISTORY:  has a past medical history of Abdominal mass, RUQ (right upper quadrant); Colon cancer (12/02/2010); Skin cancer; Heart murmur; and DVT (deep venous thrombosis) (01/28/11).     PAST SURGICAL HISTORY: Past Surgical History  Procedure Date  . Mohs surgery 1996    left ear- squamous cell  . Open right colectomy 11/25/10    hemicolectomy w/appendectomy  . Tonsillectomy     as child     FAMILY HISTORY: family history includes Cancer in his father; Colon cancer in his father and paternal grandmother; Colon cancer (age of onset:33) in his cousin; Colon cancer (age of onset:35) in his cousin; Kidney cancer in his father; Prostate cancer in his paternal grandfather; and Skin cancer in his father.   SOCIAL HISTORY:  reports that he has never smoked. He has never used smokeless tobacco. He reports that he drinks alcohol. He reports that he does not use illicit drugs.   ALLERGIES: Review of patient's allergies indicates no known allergies.   MEDICATIONS:  Current Outpatient Prescriptions  Medication Sig Dispense Refill  . aspirin 325 MG tablet Take 325 mg by mouth daily.       . cholecalciferol (VITAMIN D) 1000 UNITS tablet Take 1,000 Units by mouth daily.        Marland Kitchen glucosamine-chondroitin 500-400 MG tablet Take 2 tablets by mouth daily.       Marland Kitchen KRILL OIL OMEGA-3 PO  Take 300 mg by mouth daily. Pt uses Mega Red Omega 3 Krill Oil      . lidocaine-prilocaine (EMLA) cream Apply topically as needed. Apply to injection site one hour prior to injection.  30 g    . Multiple Vitamin (MULTIVITAMIN) tablet Take 1 tablet by mouth daily.      Marland Kitchen oxyCODONE (OXY IR/ROXICODONE) 5 MG immediate release tablet Take 1 tablet (5 mg total) by mouth every 4 (four) hours as needed for pain.  60 tablet  0  . warfarin (COUMADIN)  10 MG tablet Take 1 tablet (10 mg total) by mouth daily.  30 tablet  2   No current facility-administered medications for this encounter.   Facility-Administered Medications Ordered in Other Encounters  Medication Dose Route Frequency Provider Last Rate Last Dose  . dexamethasone (DECADRON) injection 10 mg  10 mg Intravenous Once Ladene Artist, MD      . fluorouracil (ADRUCIL) 4,450 mg in sodium chloride 0.9 % 150 mL chemo infusion  2,400 mg/m2 (Treatment Plan Actual) Intravenous 1 day or 1 dose Ladene Artist, MD   4,450 mg at 05/06/11 1358  . oxaliplatin (ELOXATIN) 160 mg in dextrose 5 % 500 mL chemo infusion  85 mg/m2 (Treatment Plan Actual) Intravenous Once Ladene Artist, MD      . sodium chloride 0.9 % injection 10 mL  10 mL Intracatheter PRN Ladene Artist, MD         REVIEW OF SYSTEMS:  A 15 point review of systems is documented in the electronic medical record. This was obtained by the nursing staff. However, I reviewed this with the patient to discuss relevant findings and make appropriate changes.  Pertinent items are noted in HPI.     PHYSICAL EXAM:  weight is 170 lb 12.8 oz (77.474 kg). His oral temperature is 98.4 F (36.9 C). His blood pressure is 137/85 and his pulse is 60. His respiration is 20.   General: Well-developed, in no acute distress HEENT: Normocephalic, atraumatic; oral cavity clear Neck: Supple without any lymphadenopathy Cardiovascular: Regular rate and rhythm Respiratory: Clear to auscultation bilaterally GI: Soft, nontender, normal bowel sounds Extremities: No edema present; the back is nontender to palpation Neuro: No focal deficits     LABORATORY DATA:  Lab Results  Component Value Date   WBC 5.5 07/15/2011   HGB 12.0* 07/15/2011   HCT 34.9* 07/15/2011   MCV 96.1 07/15/2011   PLT 177 07/15/2011   Lab Results  Component Value Date   NA 137 01/22/2012   K 4.0 01/22/2012   CL 104 01/22/2012   CO2 23 01/22/2012   Lab Results  Component Value Date     ALT 43 01/22/2012   AST 38* 01/22/2012   ALKPHOS 72 01/22/2012   BILITOT 0.70 01/22/2012      RADIOGRAPHY: Ct Abdomen Pelvis W Contrast  01/27/2012  *RADIOLOGY REPORT*  Clinical Data: Colon cancer, chemotherapy complete.  Back pain.  CT ABDOMEN AND PELVIS WITH CONTRAST  Technique:  Multidetector CT imaging of the abdomen and pelvis was performed following the standard protocol during bolus administration of intravenous contrast.  Contrast: OMNIPAQUE IOHEXOL 300 MG/ML  SOLN  Comparison: CT 11/12/2011,  PET CT 07/22/2011  Findings: Lung bases are clear.  No pericardial fluid.  No focal hepatic lesion.  Gallbladder, pancreas, spleen, adrenal glands, and kidneys are normal.  The stomach, stomach and small bowel are normal.  Surgical anatomy consistent with right hemicolectomy.  There is no evidence of obstruction  or mass at the anastomoses in the right upper quadrant.  Again demonstrated enlarged periaortic and celiac lymph nodes. Necrotic celiac node measures 19 x 27 mm (image 22) increased from 14 x 18 mm on prior CT of 11/12/2011.  Left periaortic lymph node measures 16 mm (image 20) decreased from 18 mm on prior.  A third node left of the aorta measures 8 mm (image 24) decreased from the 12 mm on prior.  There is a lymph node medial to the suprarenal IVC measuring 9 mm (image 19) which is similar to 8 mm on prior.  There is no evidence of pelvic lymphadenopathy.  No evidence of with diffuse peritoneal disease.  In the pelvis, the bladder prostate gland are normal.  No pelvic lymphadenopathy. Review of  bone windows demonstrates no aggressive osseous lesions.  IMPRESSION: 1.  Continued enlargement of the celiac lymph node is concerning for residual active metastasis.  2.  Stable to slight decrease in size of left periaortic lymph nodes. 3.  High pericaval lymph node is unchanged in size. 4.  No evidence of liver metastasis or peritoneal disease.   Original Report Authenticated By: Genevive Bi, M.D.         IMPRESSION: 51 year old male with clinical diagnosis of metastatic colon cancer. He has had enlarging retroperitoneal lymphadenopathy in the setting of some persistent back pain over the last 6-8 weeks.   PLAN: The patient is I believe an appropriate candidate for a palliative course of radiation to the dominant retroperitoneal lymph nodes. The patient does not have any very bulky lymph nodes which would be certainly an advantage in terms of being able to hopefully minimize the side effects and risks of treatment. There does not seem to be any alternative explanation for his back pain at this time and the largest, necrotic node is the most likely candidate for his pain. I've had a chance to review his CT scan and if we proceed with radiation treatment that I would treat the dominant lymph node as well as the very near by smaller lymph nodes also.  I therefore discussed with the patient an approximate three-week course of palliative radiation. We discussed the side effects and risks of treatment as well as the benefit in terms of improving local control. All of his questions were answered. In addition to hopefully improving his pain, treatment would also potentially prevent or delay other local issues secondary to growth of the lymph nodes at this site.  Patient does wish to proceed with this treatment plan. We will therefore schedule the patient for simulation as soon as this can be arranged and we will proceed with his treatment planning.      ________________________________   [name], MD, PhD

## 2012-02-17 NOTE — Progress Notes (Signed)
Pt states he has "an ache, not pain in his sides and around to his low back that comes and goes". He takes Oxycodone 1 tab qhs to sleep well, sometimes takes Tylenol w/this med. Pt exercises daily, works full time in [employer], married. Pt denies fatigue, loss of appetite.

## 2012-02-18 NOTE — Addendum Note (Signed)
Encounter addended by: Glennie Hawk, RN on: 02/18/2012  3:23 PM<BR>     Documentation filed: Charges VN

## 2012-02-26 NOTE — Addendum Note (Signed)
Encounter addended by: Delynn Flavin, RN on: 02/26/2012  6:39 PM<BR>     Documentation filed: Charges VN

## 2012-03-03 ENCOUNTER — Ambulatory Visit
Admission: RE | Admit: 2012-03-03 | Discharge: 2012-03-03 | Disposition: A | Discharge status: Home / Self Care | Payer: BC Managed Care – PPO | Source: Ambulatory Visit | Attending: Radiation Oncology | Admitting: Radiation Oncology

## 2012-03-03 ENCOUNTER — Ambulatory Visit: Admission: RE | Admit: 2012-03-03 | Payer: BC Managed Care – PPO | Source: Ambulatory Visit

## 2012-03-03 DIAGNOSIS — C772 Secondary and unspecified malignant neoplasm of intra-abdominal lymph nodes: Secondary | ICD-10-CM

## 2012-03-08 ENCOUNTER — Other Ambulatory Visit (HOSPITAL_BASED_OUTPATIENT_CLINIC_OR_DEPARTMENT_OTHER): Payer: BC Managed Care – PPO | Admitting: Lab

## 2012-03-08 ENCOUNTER — Ambulatory Visit (HOSPITAL_BASED_OUTPATIENT_CLINIC_OR_DEPARTMENT_OTHER): Payer: BC Managed Care – PPO | Admitting: Oncology

## 2012-03-08 ENCOUNTER — Telehealth: Payer: Self-pay | Admitting: Oncology

## 2012-03-08 ENCOUNTER — Ambulatory Visit: Payer: BC Managed Care – PPO | Admitting: Pharmacist

## 2012-03-08 VITALS — BP 141/85 | HR 61 | Temp 97.8°F | Resp 20 | Ht 69.5 in | Wt 165.9 lb

## 2012-03-08 DIAGNOSIS — C189 Malignant neoplasm of colon, unspecified: Secondary | ICD-10-CM

## 2012-03-08 DIAGNOSIS — I82409 Acute embolism and thrombosis of unspecified deep veins of unspecified lower extremity: Secondary | ICD-10-CM

## 2012-03-08 DIAGNOSIS — C182 Malignant neoplasm of ascending colon: Secondary | ICD-10-CM

## 2012-03-08 DIAGNOSIS — Z86718 Personal history of other venous thrombosis and embolism: Secondary | ICD-10-CM

## 2012-03-08 LAB — PROTIME-INR

## 2012-03-08 MED ORDER — SODIUM CHLORIDE 0.9 % IJ SOLN
10.0000 mL | INTRAMUSCULAR | Status: DC | PRN
  Administered 2012-03-08: 10 mL via INTRAVENOUS
  Filled 2012-03-08: qty 10

## 2012-03-08 MED ORDER — HEPARIN SOD (PORK) LOCK FLUSH 100 UNIT/ML IV SOLN
500.0000 [IU] | Freq: Once | INTRAVENOUS | Status: AC
  Administered 2012-03-08: 500 [IU] via INTRAVENOUS
  Filled 2012-03-08: qty 5

## 2012-03-08 MED ORDER — OXYCODONE HCL 5 MG PO TABS: 5.0000 mg | ORAL_TABLET | ORAL | Status: DC | PRN

## 2012-03-08 NOTE — Telephone Encounter (Signed)
Printed and gv pt appt schedule to pt for NOV

## 2012-03-08 NOTE — Progress Notes (Signed)
Pt states that he has been experiencing more pain in abdomen and has been taking approximately APAP 3g/day and oxycodone 5mg  TID.  Pt has appt with Dr. Truett Perna today and will discuss pain management options.  INR may be elevated from increased APAP use, but pt has been stable on Coumadin 10mg  daily since 09/2011.  Pt has taken Coumadin today, so instructed pt to hold coumadin tomorrow and resume 10mg  daily on 03/10/12.  Will check PT/INR next week.  Pt starting XRT on 03/14/12.

## 2012-03-08 NOTE — Progress Notes (Signed)
[facility]    OFFICE PROGRESS NOTE   INTERVAL HISTORY:   He returns as scheduled. The back pain has progressed. He now takes oxycodone approximately 2 times daily. He takes Tylenol as well. He reports recently waking up during the night and pain. Terry Vargas has been evaluated by Dr. Mitzi Hansen. He is scheduled to begin radiation on 03/14/2012.  Stable neuropathy symptoms. He has noted an increase in the back pain when he eats a large meal. He is now eating frequent small meals. No other complaint.  Objective:  Vital signs in last 24 hours:  Blood pressure 141/85, pulse 61, temperature 97.8 F (36.6 C), temperature source Oral, resp. rate 20, height 5' 9.5" (1.765 m), weight 165 lb 14.4 oz (75.252 kg).    HEENT: Neck without mass Resp: Lungs clear bilaterally Cardio: Regular rate and rhythm GI: No hepatomegaly, no mass Vascular: No leg edema    Portacath/PICC-without erythema  Lab Results: PT-INR 3.7  Medications: I have reviewed the patient's current medications.  Assessment/Plan: #1-Stage III (T4, N2) adenocarcinoma of the right colon, status post a right colectomy 11/25/2010.  He began adjuvant FOLFOX chemotherapy 12/31/2010. He completed cycle 12 of FOLFOX on 06/03/2011.  #2-PET scan 12/29/2010 with hypermetabolic retroperitoneal and anterior abdominal adenopathy, postoperative changes of the right colectomy with a possible seroma in the anterior abdomen. Patchy activity was noted in the parotid glands, potentially inflammatory in etiology.  -The hypermetabolic retroperitoneal lymph nodes potentially represented metastatic disease. We presented his case at the GI tumor conference and discussed the case with Dr. Dwain Sarna. A decision was made to proceed with "adjuvant" therapy with the plan for a restaging CT at a 3 to four-month interval.  -The restaging CT on 03/23/2011 revealed a decrease in the periaortic lymphadenopathy, no new adenopathy, and no evidence  of liver metastases. This suggests the small retroperitoneal lymph nodes may represent metastatic colon cancer versus resolving "inflammatory" nodes.  -Restaging CT 07/13/2011 revealed increased retroperitoneal lymphadenopathy consistent with metastatic disease.  -Restaging PET scan 07/22/2011 confirmed persistent hypermetabolic retroperitoneal lymph nodes. 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 A basal cell carcinoma was recently removed from a right finger by Dr. Karlyn Agee.  #6-Left leg deep vein thrombosis confirmed on a Doppler ultrasound 01/28/2011, now maintained on Coumadin.  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 He has persistent mild numbness in the fingertips and toes. The numbness does not interfere with activity.  #8-Mild neutropenia on 03/11/2011, he received Neulasta following cycle #6 of FOLFOX. He also received Neulasta support following cycle 8, cycle 10 and cycle #12.  #9-Thrombocytopenia secondary to chemotherapy-resolved.  #10-low back pain-most likely related to the necrotic celiac node , scheduled to begin palliative radiation on 03/14/2012   Disposition:  The back pain has progressed. He otherwise appears stable. He will continue oxycodone and Tylenol as needed for pain. He will contact me if the oxycodone does not help or if he has to take the oxycodone more frequently. We will consider adding a long-acting narcotic. I cautioned him against driving  or  working with machinery while taking narcotics.  He is scheduled to begin radiation on 03/14/2012. I plan to discuss the case with Dr. Mitzi Hansen regarding the indication for radiosensitizing capecitabine. We reviewed the potential toxicities associated with capecitabine today. He understands the potential for mucositis, diarrhea, skin hyperpigmentation, skin rash, and the hand/foot syndrome. We also discussed the interaction of capecitabine with Coumadin.  He will return for an office visit in 3 weeks.   Terry Papas, MD  03/08/2012  6:01 PM

## 2012-03-14 ENCOUNTER — Ambulatory Visit
Admission: RE | Admit: 2012-03-14 | Discharge: 2012-03-14 | Disposition: A | Discharge status: Home / Self Care | Payer: BC Managed Care – PPO | Source: Ambulatory Visit | Attending: Radiation Oncology | Admitting: Radiation Oncology

## 2012-03-15 ENCOUNTER — Ambulatory Visit
Admission: RE | Admit: 2012-03-15 | Discharge: 2012-03-15 | Disposition: A | Discharge status: Home / Self Care | Payer: BC Managed Care – PPO | Source: Ambulatory Visit | Attending: Radiation Oncology | Admitting: Radiation Oncology

## 2012-03-15 ENCOUNTER — Telehealth: Payer: Self-pay | Admitting: *Deleted

## 2012-03-15 DIAGNOSIS — C189 Malignant neoplasm of colon, unspecified: Secondary | ICD-10-CM

## 2012-03-15 MED ORDER — HYDROMORPHONE HCL 4 MG PO TABS: 4.0000 mg | ORAL_TABLET | Freq: Four times a day (QID) | ORAL | Status: DC | PRN

## 2012-03-15 NOTE — Telephone Encounter (Signed)
Reports progression of back pain. More episodes of severe pain. Took #3 oxycodone at 0915 last night and still in a lot of pain after midnight. Comes in this afternoon for 2nd radiation treatment at 3:15 pm.

## 2012-03-16 ENCOUNTER — Ambulatory Visit
Admission: RE | Admit: 2012-03-16 | Discharge: 2012-03-16 | Disposition: A | Discharge status: Home / Self Care | Payer: BC Managed Care – PPO | Source: Ambulatory Visit | Attending: Radiation Oncology | Admitting: Radiation Oncology

## 2012-03-17 ENCOUNTER — Ambulatory Visit (HOSPITAL_BASED_OUTPATIENT_CLINIC_OR_DEPARTMENT_OTHER): Payer: BC Managed Care – PPO | Admitting: Pharmacist

## 2012-03-17 ENCOUNTER — Other Ambulatory Visit (HOSPITAL_BASED_OUTPATIENT_CLINIC_OR_DEPARTMENT_OTHER): Payer: BC Managed Care – PPO

## 2012-03-17 ENCOUNTER — Ambulatory Visit
Admission: RE | Admit: 2012-03-17 | Discharge: 2012-03-17 | Disposition: A | Discharge status: Home / Self Care | Payer: BC Managed Care – PPO | Source: Ambulatory Visit | Attending: Radiation Oncology | Admitting: Radiation Oncology

## 2012-03-17 DIAGNOSIS — I82409 Acute embolism and thrombosis of unspecified deep veins of unspecified lower extremity: Secondary | ICD-10-CM

## 2012-03-17 LAB — PROTIME-INR

## 2012-03-17 LAB — POCT INR: INR: 2.5

## 2012-03-17 NOTE — Progress Notes (Signed)
INR = 2.5 after holding 1 dose last week for supratherapeutic INR.   His usual Coumadin dose has been 10 mg daily.  He was maintained at that dose until last week. Pt undergoing XRT of abd.  Having increased pain w/ XRT.  Now on Dilaudid. His PO intake has decreased overall, pt feels.  He eats smaller meals.  Has 1/2 shake in AM & 1/2 shake in PM. He has lost some wt.  His albumin is normal (last month). INR now therapeutic.  I will have him stay at 10 mg daily. Repeat INR next week.  We need to monitor closely w/ decreased PO intake.  Potential cause for "bump" in INR last week. Marily Lente, Pharm.D.

## 2012-03-18 ENCOUNTER — Encounter: Payer: Self-pay | Admitting: Radiation Oncology

## 2012-03-18 ENCOUNTER — Ambulatory Visit
Admission: RE | Admit: 2012-03-18 | Discharge: 2012-03-18 | Disposition: A | Discharge status: Home / Self Care | Payer: BC Managed Care – PPO | Source: Ambulatory Visit | Attending: Radiation Oncology | Admitting: Radiation Oncology

## 2012-03-18 VITALS — BP 169/102 | HR 58 | Resp 18 | Wt 169.6 lb

## 2012-03-18 DIAGNOSIS — C772 Secondary and unspecified malignant neoplasm of intra-abdominal lymph nodes: Secondary | ICD-10-CM

## 2012-03-18 NOTE — Progress Notes (Signed)
Patient presents to the clinic today unaccompanied for PUT with Dr. Mitzi Hansen. Patient alert and oriented to person, place, and time. No distress noted. Steady gait noted. Pleasant affect noted. Patient denies pain at this time. However, patient reports that 2-3 hours following first 3 radiation treatments he experience intense cramp low back pain 12 on a scale of 0-10 for which oxycodone did not touch. The pain kept him awake even. Patient reports that these first three session where done late at night. Patient reports that he has noticed if his belly is full he has more intense pain therefore he is trying to eat more small frequent meal throughout the day. Patient reports that his fourth treatment was 0800 and he only experienced mild discomfort. Patient denies nausea or vomiting. Patient reports having a firm bowel movement this morning. Educated patient on constipation. Patient verbalized understanding.

## 2012-03-18 NOTE — Progress Notes (Signed)
Department of Radiation Oncology  Phone:  [phone] Fax:        [phone]  Weekly Treatment Note    Name: Terry Vargas Date: 03/18/2012 MRN: [id number] DOB: 1960/09/18   Current dose: 12.5 Gy  Current fraction: 5   MEDICATIONS: Current Outpatient Prescriptions  Medication Sig Dispense Refill  . acetaminophen (TYLENOL) 500 MG tablet Take 1,000 mg by mouth every 6 (six) hours as needed.      Marland Kitchen aspirin 325 MG tablet Take 325 mg by mouth daily.       . cholecalciferol (VITAMIN D) 1000 UNITS tablet Take 1,000 Units by mouth daily.        Marland Kitchen glucosamine-chondroitin 500-400 MG tablet Take 2 tablets by mouth daily.       Marland Kitchen HYDROmorphone (DILAUDID) 4 MG tablet Take 1-2 tablets (4-8 mg total) by mouth every 6 (six) hours as needed for pain (severe).  30 tablet  0  . KRILL OIL OMEGA-3 PO Take 300 mg by mouth daily. Pt uses Mega Red Omega 3 Krill Oil      . lidocaine-prilocaine (EMLA) cream Apply topically as needed. Apply to injection site one hour prior to injection.  30 g    . Multiple Vitamin (MULTIVITAMIN) tablet Take 1 tablet by mouth daily.      Marland Kitchen oxyCODONE (OXY IR/ROXICODONE) 5 MG immediate release tablet Take 1-2 tablets (5-10 mg total) by mouth every 4 (four) hours as needed for pain (DO NOT DRIVE).  100 tablet  0  . warfarin (COUMADIN) 10 MG tablet Take 1 tablet (10 mg total) by mouth daily.  30 tablet  2  . oxyCODONE (OXY IR/ROXICODONE) 5 MG immediate release tablet Take 1 tablet (5 mg total) by mouth every 4 (four) hours as needed for pain.  30 tablet  0   No current facility-administered medications for this encounter.   Facility-Administered Medications Ordered in Other Encounters  Medication Dose Route Frequency Provider Last Rate Last Dose  . dexamethasone (DECADRON) injection 10 mg  10 mg Intravenous Once Ladene Artist, MD      . fluorouracil (ADRUCIL) 4,450 mg in sodium chloride 0.9 % 150 mL chemo infusion  2,400 mg/m2 (Treatment Plan Actual) Intravenous 1 day  or 1 dose Ladene Artist, MD   4,450 mg at 05/06/11 1358  . oxaliplatin (ELOXATIN) 160 mg in dextrose 5 % 500 mL chemo infusion  85 mg/m2 (Treatment Plan Actual) Intravenous Once Ladene Artist, MD      . sodium chloride 0.9 % injection 10 mL  10 mL Intracatheter PRN Ladene Artist, MD         ALLERGIES: Review of patient's allergies indicates no known allergies.   LABORATORY DATA:  Lab Results  Component Value Date   WBC 5.5 07/15/2011   HGB 12.0* 07/15/2011   HCT 34.9* 07/15/2011   MCV 96.1 07/15/2011   PLT 177 07/15/2011   Lab Results  Component Value Date   NA 137 01/22/2012   K 4.0 01/22/2012   CL 104 01/22/2012   CO2 23 01/22/2012   Lab Results  Component Value Date   ALT 43 01/22/2012   AST 38* 01/22/2012   ALKPHOS 72 01/22/2012   BILITOT 0.70 01/22/2012     NARRATIVE: Terry Vargas was seen today for weekly treatment management. The chart was checked and the patient's films were reviewed. The patient has completed his first week of treatment. He has described some back pain which has occurred several hours after his  radiation treatments. He describes this as a cramping low back pain which was severe. He does have some pain medicine to take for this which has been helpful. This did not happen yesterday however.  PHYSICAL EXAMINATION: weight is 169 lb 9.6 oz (76.93 kg). His blood pressure is 169/102 and his pulse is 58. His respiration is 18.        ASSESSMENT: The patient is doing satisfactorily with treatment.  PLAN: We will continue with the patient's radiation treatment as planned. I'm not sure what the etiology of his back pain is as this happened beginning with his first treatment. He denies any actual pain on the table which is often the source of such pain early. We will follow this and he will use pain medicine as needed.

## 2012-03-18 NOTE — Progress Notes (Signed)
Check bp a second time. Bp remains elevated. Patient has bp machine at home. Encouraged patient to monitor daily and document level. Encouraged patient to contact PCP if upward trend is noted. Patient verbalized understanding.

## 2012-03-21 ENCOUNTER — Ambulatory Visit
Admission: RE | Admit: 2012-03-21 | Discharge: 2012-03-21 | Disposition: A | Discharge status: Home / Self Care | Payer: BC Managed Care – PPO | Source: Ambulatory Visit | Attending: Radiation Oncology | Admitting: Radiation Oncology

## 2012-03-22 ENCOUNTER — Ambulatory Visit
Admission: RE | Admit: 2012-03-22 | Discharge: 2012-03-22 | Disposition: A | Discharge status: Home / Self Care | Payer: BC Managed Care – PPO | Source: Ambulatory Visit | Attending: Radiation Oncology | Admitting: Radiation Oncology

## 2012-03-23 ENCOUNTER — Ambulatory Visit
Admission: RE | Admit: 2012-03-23 | Discharge: 2012-03-23 | Disposition: A | Discharge status: Home / Self Care | Payer: BC Managed Care – PPO | Source: Ambulatory Visit | Attending: Radiation Oncology | Admitting: Radiation Oncology

## 2012-03-24 ENCOUNTER — Ambulatory Visit
Admission: RE | Admit: 2012-03-24 | Discharge: 2012-03-24 | Disposition: A | Discharge status: Home / Self Care | Payer: BC Managed Care – PPO | Source: Ambulatory Visit | Attending: Radiation Oncology | Admitting: Radiation Oncology

## 2012-03-24 ENCOUNTER — Other Ambulatory Visit (HOSPITAL_BASED_OUTPATIENT_CLINIC_OR_DEPARTMENT_OTHER): Payer: BC Managed Care – PPO

## 2012-03-24 ENCOUNTER — Ambulatory Visit (HOSPITAL_BASED_OUTPATIENT_CLINIC_OR_DEPARTMENT_OTHER): Payer: BC Managed Care – PPO | Admitting: Pharmacist

## 2012-03-24 DIAGNOSIS — Z7901 Long term (current) use of anticoagulants: Secondary | ICD-10-CM

## 2012-03-24 DIAGNOSIS — I82409 Acute embolism and thrombosis of unspecified deep veins of unspecified lower extremity: Secondary | ICD-10-CM

## 2012-03-24 LAB — PROTIME-INR
INR: 3.3 (ref 2.00–3.50)
Protime: 39.6 Seconds — ABNORMAL HIGH (ref 10.6–13.4)

## 2012-03-24 NOTE — Progress Notes (Signed)
INR slightly supratherapeutic (3.3) on 10mg  daily. Pt's INR has been persistently at the higher end of normal for the last 8 weeks or so.  No changes in meds.  Diet may be decreased overall since pt reports that the radiation has made it hurt to eat sometimes.  No problems with bleeding or bruising. Will have pt decrease dose slightly to 10mg  daily except 5mg  on Thursdays.  Recheck INR in 1 week.

## 2012-03-25 ENCOUNTER — Encounter: Payer: Self-pay | Admitting: Radiation Oncology

## 2012-03-25 ENCOUNTER — Ambulatory Visit
Admission: RE | Admit: 2012-03-25 | Discharge: 2012-03-25 | Disposition: A | Discharge status: Home / Self Care | Payer: BC Managed Care – PPO | Source: Ambulatory Visit | Attending: Radiation Oncology | Admitting: Radiation Oncology

## 2012-03-25 VITALS — BP 138/88 | HR 62 | Resp 18 | Wt 167.4 lb

## 2012-03-25 DIAGNOSIS — C772 Secondary and unspecified malignant neoplasm of intra-abdominal lymph nodes: Secondary | ICD-10-CM

## 2012-03-25 NOTE — Progress Notes (Signed)
Patient presents to the clinic today for PUT with Dr. Mitzi Hansen. Patient is alert and oriented to person, place, and time. No distress noted. Steady gait noted. Pleasant affect noted. Patient reports only mild pain presently in his abdomen but, denies need for pain medication at this time. Patient reports that around 8 pm the pain in his abdomen will become a little more intense requiring him to take one oxycodone. Patient reports that his pain is much more controlled than before. Patient denies nausea or vomiting. Patient denies diarrhea but, reports an occasional firm bowel movement. Patient reports that he has increased his water intake. Patient has no other complaints. Reported all findings to Dr. Mitzi Hansen.

## 2012-03-25 NOTE — Progress Notes (Signed)
Department of Radiation Oncology  Phone:  [phone] Fax:        [phone]  Weekly Treatment Note    Name: Terry Vargas Date: 03/25/2012 MRN: [id number] DOB: Sep 18, 1960   Current dose: 25 Gy  Current fraction: 10   MEDICATIONS: Current Outpatient Prescriptions  Medication Sig Dispense Refill  . acetaminophen (TYLENOL) 500 MG tablet Take 1,000 mg by mouth every 6 (six) hours as needed.      Marland Kitchen aspirin 325 MG tablet Take 325 mg by mouth daily.       . cholecalciferol (VITAMIN D) 1000 UNITS tablet Take 1,000 Units by mouth daily.        Marland Kitchen glucosamine-chondroitin 500-400 MG tablet Take 2 tablets by mouth daily.       Marland Kitchen HYDROmorphone (DILAUDID) 4 MG tablet Take 1-2 tablets (4-8 mg total) by mouth every 6 (six) hours as needed for pain (severe).  30 tablet  0  . KRILL OIL OMEGA-3 PO Take 300 mg by mouth daily. Pt uses Mega Red Omega 3 Krill Oil      . lidocaine-prilocaine (EMLA) cream Apply topically as needed. Apply to injection site one hour prior to injection.  30 g    . Multiple Vitamin (MULTIVITAMIN) tablet Take 1 tablet by mouth daily.      Marland Kitchen oxyCODONE (OXY IR/ROXICODONE) 5 MG immediate release tablet Take 1-2 tablets (5-10 mg total) by mouth every 4 (four) hours as needed for pain (DO NOT DRIVE).  100 tablet  0  . warfarin (COUMADIN) 10 MG tablet Take 1 tablet (10 mg total) by mouth daily.  30 tablet  2  . oxyCODONE (OXY IR/ROXICODONE) 5 MG immediate release tablet Take 1 tablet (5 mg total) by mouth every 4 (four) hours as needed for pain.  30 tablet  0   No current facility-administered medications for this encounter.   Facility-Administered Medications Ordered in Other Encounters  Medication Dose Route Frequency Provider Last Rate Last Dose  . dexamethasone (DECADRON) injection 10 mg  10 mg Intravenous Once Ladene Artist, MD      . fluorouracil (ADRUCIL) 4,450 mg in sodium chloride 0.9 % 150 mL chemo infusion  2,400 mg/m2 (Treatment Plan Actual) Intravenous 1 day  or 1 dose Ladene Artist, MD   4,450 mg at 05/06/11 1358  . oxaliplatin (ELOXATIN) 160 mg in dextrose 5 % 500 mL chemo infusion  85 mg/m2 (Treatment Plan Actual) Intravenous Once Ladene Artist, MD      . sodium chloride 0.9 % injection 10 mL  10 mL Intracatheter PRN Ladene Artist, MD         ALLERGIES: Review of patient's allergies indicates no known allergies.   LABORATORY DATA:  Lab Results  Component Value Date   WBC 5.5 07/15/2011   HGB 12.0* 07/15/2011   HCT 34.9* 07/15/2011   MCV 96.1 07/15/2011   PLT 177 07/15/2011   Lab Results  Component Value Date   NA 137 01/22/2012   K 4.0 01/22/2012   CL 104 01/22/2012   CO2 23 01/22/2012   Lab Results  Component Value Date   ALT 43 01/22/2012   AST 38* 01/22/2012   ALKPHOS 72 01/22/2012   BILITOT 0.70 01/22/2012     NARRATIVE: Terry Vargas was seen today for weekly treatment management. The chart was checked and the patient's films were reviewed. The patient is doing well. No ongoing pain as before in his first week. No nausea or diarrhea. He does take pain  medicine on occasion but not very often currently.  PHYSICAL EXAMINATION: weight is 167 lb 6.4 oz (75.932 kg). His blood pressure is 138/88 and his pulse is 62. His respiration is 18.        ASSESSMENT: The patient is doing satisfactorily with treatment.  PLAN: We will continue with the patient's radiation treatment as planned.

## 2012-03-28 ENCOUNTER — Ambulatory Visit
Admission: RE | Admit: 2012-03-28 | Discharge: 2012-03-28 | Disposition: A | Discharge status: Home / Self Care | Payer: BC Managed Care – PPO | Source: Ambulatory Visit | Attending: Radiation Oncology | Admitting: Radiation Oncology

## 2012-03-29 ENCOUNTER — Ambulatory Visit
Admission: RE | Admit: 2012-03-29 | Discharge: 2012-03-29 | Disposition: A | Discharge status: Home / Self Care | Payer: BC Managed Care – PPO | Source: Ambulatory Visit | Attending: Radiation Oncology | Admitting: Radiation Oncology

## 2012-03-30 ENCOUNTER — Ambulatory Visit
Admission: RE | Admit: 2012-03-30 | Discharge: 2012-03-30 | Disposition: A | Discharge status: Home / Self Care | Payer: BC Managed Care – PPO | Source: Ambulatory Visit | Attending: Radiation Oncology | Admitting: Radiation Oncology

## 2012-03-31 ENCOUNTER — Other Ambulatory Visit (HOSPITAL_BASED_OUTPATIENT_CLINIC_OR_DEPARTMENT_OTHER): Payer: BC Managed Care – PPO

## 2012-03-31 ENCOUNTER — Ambulatory Visit (HOSPITAL_BASED_OUTPATIENT_CLINIC_OR_DEPARTMENT_OTHER): Payer: BC Managed Care – PPO | Admitting: Oncology

## 2012-03-31 ENCOUNTER — Ambulatory Visit
Admission: RE | Admit: 2012-03-31 | Discharge: 2012-03-31 | Disposition: A | Discharge status: Home / Self Care | Payer: BC Managed Care – PPO | Source: Ambulatory Visit | Attending: Radiation Oncology | Admitting: Radiation Oncology

## 2012-03-31 ENCOUNTER — Telehealth: Payer: Self-pay | Admitting: Oncology

## 2012-03-31 ENCOUNTER — Ambulatory Visit: Payer: BC Managed Care – PPO | Admitting: Pharmacist

## 2012-03-31 DIAGNOSIS — C182 Malignant neoplasm of ascending colon: Secondary | ICD-10-CM

## 2012-03-31 DIAGNOSIS — I82409 Acute embolism and thrombosis of unspecified deep veins of unspecified lower extremity: Secondary | ICD-10-CM

## 2012-03-31 DIAGNOSIS — C189 Malignant neoplasm of colon, unspecified: Secondary | ICD-10-CM

## 2012-03-31 LAB — PROTIME-INR: Protime: 28.8 Seconds — ABNORMAL HIGH (ref 10.6–13.4)

## 2012-03-31 NOTE — Progress Notes (Signed)
Pt alone for appmt. Therapeutic at 2.6 today.  Took half dose and resumed 10 mg daily after last visit.  Will continue 10mg  daily. Recheck INR with ML appmt on 04/26/12 at 2:45 for lab; 3:00 coamadin clinic; 3:15 Terry Vargas and 4:00 flush. Called in Coumadin 10 mg # 30 with 3 refills to [facility] at [employer].

## 2012-03-31 NOTE — Progress Notes (Signed)
   [facility]    OFFICE PROGRESS NOTE   INTERVAL HISTORY:   He returns as scheduled. He is completing palliative radiation to the retroperitoneal lymph nodes. Terry Vargas reports severe back pain on the been following the first 3 radiation treatments. The pain was not relieved with oxycodone. There's been a marketed improvement in the pain over the past week. He has not taken pain medication since 03/26/2012. No other complaint. Stable neuropathy symptoms. Mild diarrhea following radiation.  Objective:  Vital signs in last 24 hours:  There were no vitals taken for this visit.  Resp: Lungs clear bilaterally Cardio: Regular rate and rhythm GI: No hepatomegaly, nontender, no mass Vascular: The left lower leg is slightly larger than the right side  Portacath/PICC-without erythema  Lab Results: PT/INR 2.4    Medications: I have reviewed the patient's current medications.  Assessment/Plan: 1.Stage III (T4, N2) adenocarcinoma of the right colon, status post a right colectomy 11/25/2010.  He began adjuvant FOLFOX chemotherapy 12/31/2010. He completed cycle 12 of FOLFOX on 06/03/2011.  #2-PET scan 12/29/2010 with hypermetabolic retroperitoneal and anterior abdominal adenopathy, postoperative changes of the right colectomy with a possible seroma in the anterior abdomen. Patchy activity was noted in the parotid glands, potentially inflammatory in etiology.  -The hypermetabolic retroperitoneal lymph nodes potentially represented metastatic disease. We presented his case at the GI tumor conference and discussed the case with Dr. Dwain Vargas. A decision was made to proceed with "adjuvant" therapy with the plan for a restaging CT at a 3 to four-month interval.  -The restaging CT on 03/23/2011 revealed a decrease in the periaortic lymphadenopathy, no new adenopathy, and no evidence of liver metastases. This suggests the small retroperitoneal lymph nodes may represent metastatic colon  cancer versus resolving "inflammatory" nodes.  -Restaging CT 07/13/2011 revealed increased retroperitoneal lymphadenopathy consistent with metastatic disease.  -Restaging PET scan 07/22/2011 confirmed persistent hypermetabolic retroperitoneal lymph nodes. 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 A basal cell carcinoma was recently removed from a right finger by Dr. Karlyn Vargas.  #6-Left leg deep vein thrombosis confirmed on a Doppler ultrasound 01/28/2011, now maintained on Coumadin.  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 He has persistent mild numbness in the fingertips and toes. The numbness does not interfere with activity.  #8-Mild neutropenia on 03/11/2011, he received Neulasta following cycle #6 of FOLFOX. He also received Neulasta support following cycle 8, cycle 10 and cycle #12.  #9-Thrombocytopenia secondary to chemotherapy-resolved.  #10-low back pain-most likely related to the necrotic celiac node , he began palliative radiation on 03/14/2012. The pain has improved.   Disposition:  He is completing palliative radiation to the retroperitoneal lymph nodes. The back pain has improved significantly over the past week. He will complete the planned course of radiation on 04/04/2012.  Terry Vargas will return for an office visit and Port-A-Cath flush on 04/28/2012. We will plan for a restaging CT evaluation within the next 2-3 months.   Terry Papas, MD  03/31/2012  3:53  PM

## 2012-03-31 NOTE — Telephone Encounter (Signed)
appts made and printed for pt aom °

## 2012-03-31 NOTE — Patient Instructions (Signed)
continue 10mg  daily. Recheck INR with ML appmt on 04/26/12 at 2:45 for lab; 3:00 coamadin clinic; 3:15 Lonna Cobb and 4:00 flush.

## 2012-04-01 ENCOUNTER — Ambulatory Visit
Admission: RE | Admit: 2012-04-01 | Discharge: 2012-04-01 | Disposition: A | Discharge status: Home / Self Care | Payer: BC Managed Care – PPO | Source: Ambulatory Visit | Attending: Radiation Oncology | Admitting: Radiation Oncology

## 2012-04-01 ENCOUNTER — Encounter: Payer: Self-pay | Admitting: Radiation Oncology

## 2012-04-01 VITALS — BP 146/89 | HR 61 | Resp 18 | Wt 164.4 lb

## 2012-04-01 DIAGNOSIS — C772 Secondary and unspecified malignant neoplasm of intra-abdominal lymph nodes: Secondary | ICD-10-CM

## 2012-04-01 NOTE — Progress Notes (Signed)
Department of Radiation Oncology  Phone:  [phone] Fax:        [phone]  Weekly Treatment Note    Name: Terry Vargas Date: 04/01/2012 MRN: [id number] DOB: 10-09-1960   Current dose: 37.5 Gy  Current fraction: 15   MEDICATIONS: Current Outpatient Prescriptions  Medication Sig Dispense Refill  . acetaminophen (TYLENOL) 500 MG tablet Take 1,000 mg by mouth every 6 (six) hours as needed.      Marland Kitchen aspirin 325 MG tablet Take 325 mg by mouth daily.       . cholecalciferol (VITAMIN D) 1000 UNITS tablet Take 1,000 Units by mouth daily.        Marland Kitchen glucosamine-chondroitin 500-400 MG tablet Take 2 tablets by mouth daily.       Marland Kitchen HYDROmorphone (DILAUDID) 4 MG tablet Take 1-2 tablets (4-8 mg total) by mouth every 6 (six) hours as needed for pain (severe).  30 tablet  0  . KRILL OIL OMEGA-3 PO Take 300 mg by mouth daily. Pt uses Mega Red Omega 3 Krill Oil      . lidocaine-prilocaine (EMLA) cream Apply topically as needed. Apply to injection site one hour prior to injection.  30 g    . Multiple Vitamin (MULTIVITAMIN) tablet Take 1 tablet by mouth daily.      Marland Kitchen oxyCODONE (OXY IR/ROXICODONE) 5 MG immediate release tablet Take 1-2 tablets (5-10 mg total) by mouth every 4 (four) hours as needed for pain (DO NOT DRIVE).  100 tablet  0  . warfarin (COUMADIN) 10 MG tablet Take 1 tablet (10 mg total) by mouth daily.  30 tablet  2   No current facility-administered medications for this encounter.   Facility-Administered Medications Ordered in Other Encounters  Medication Dose Route Frequency Provider Last Rate Last Dose  . dexamethasone (DECADRON) injection 10 mg  10 mg Intravenous Once Ladene Artist, MD      . fluorouracil (ADRUCIL) 4,450 mg in sodium chloride 0.9 % 150 mL chemo infusion  2,400 mg/m2 (Treatment Plan Actual) Intravenous 1 day or 1 dose Ladene Artist, MD   4,450 mg at 05/06/11 1358  . oxaliplatin (ELOXATIN) 160 mg in dextrose 5 % 500 mL chemo infusion  85 mg/m2 (Treatment  Plan Actual) Intravenous Once Ladene Artist, MD      . sodium chloride 0.9 % injection 10 mL  10 mL Intracatheter PRN Ladene Artist, MD         ALLERGIES: Review of patient's allergies indicates no known allergies.   LABORATORY DATA:  Lab Results  Component Value Date   WBC 5.5 07/15/2011   HGB 12.0* 07/15/2011   HCT 34.9* 07/15/2011   MCV 96.1 07/15/2011   PLT 177 07/15/2011   Lab Results  Component Value Date   NA 137 01/22/2012   K 4.0 01/22/2012   CL 104 01/22/2012   CO2 23 01/22/2012   Lab Results  Component Value Date   ALT 43 01/22/2012   AST 38* 01/22/2012   ALKPHOS 72 01/22/2012   BILITOT 0.70 01/22/2012     NARRATIVE: Terry Vargas was seen today for weekly treatment management. The chart was checked and the patient's films were reviewed. The patient is doing well at this time. He denies any nausea. Some occasional loose stools. The patient's pain has significantly improved, not taking any pain medication currently.  PHYSICAL EXAMINATION: weight is 164 lb 6.4 oz (74.571 kg). His blood pressure is 146/89 and his pulse is 61. His respiration is 18.  ASSESSMENT: The patient is doing satisfactorily with treatment.  PLAN: We will continue with the patient's radiation treatment as planned. Am very pleased with his response. He'll followup in one month. One more fraction.

## 2012-04-01 NOTE — Progress Notes (Signed)
Patient presents to the clinic today unaccompanied for PUT with Dr. Mitzi Hansen. Patient is alert and oriented to person, place, and time. No distress noted. Steady gait noted. Pleasant affect noted. Patient denies pain medication. Patient denies taking pain medication since last week. Patient denies nausea or vomiting. Three pound weight loss noted since last week. Patient weighed in at 167.6 last week but, today is 164.6. Patient denies constipation but, reports one bowel movement per day that is either soft or diarrhea. Patient reports fatigue. Reported all findings to Dr. Mitzi Hansen.

## 2012-04-04 ENCOUNTER — Encounter: Payer: Self-pay | Admitting: Radiation Oncology

## 2012-04-04 ENCOUNTER — Ambulatory Visit
Admission: RE | Admit: 2012-04-04 | Discharge: 2012-04-04 | Disposition: A | Discharge status: Home / Self Care | Payer: BC Managed Care – PPO | Source: Ambulatory Visit | Attending: Radiation Oncology | Admitting: Radiation Oncology

## 2012-04-06 NOTE — Progress Notes (Signed)
  Radiation Oncology         [phone] ________________________________  Name: Terry Vargas MRN: [id number]  Date: 04/04/2012  DOB: Sep 17, 1960  End of Treatment Note  Diagnosis:   Metastatic colon cancer     Indication for treatment:  Palliative       Radiation treatment dates:   03/14/2012 through 04/04/2012  Site/dose:   The patient was treated to the lymph nodes primarily in the retroperitoneal/abdominal region. He was treated using a 3-D conformal technique on our tomotherapy unit to a dose of 40 gray at 2.5 gray per fraction. Daily image guidance was used for this treatment.  Narrative: The patient tolerated radiation treatment relatively well.   The patient initially had some back pain after treatment which she experienced after the first several treatments. This resolved as he went through treatment and by the end of treatment his pain had markedly improved. He was no longer taking substantial pain medicine.  Plan: The patient has completed radiation treatment. The patient will return to radiation oncology clinic for routine followup in one month. I advised the patient to call or return sooner if they have any questions or concerns related to their recovery or treatment. ________________________________  [name], M.D., Ph.D.

## 2012-04-06 NOTE — Progress Notes (Signed)
  Radiation Oncology         [phone] ________________________________  Name: Terry Vargas MRN: [id number]  Date: 03/03/2012  DOB: Nov 30, 1960  SIMULATION AND TREATMENT PLANNING NOTE  DIAGNOSIS:  Metastatic colon cancer  NARRATIVE:  The patient was brought to the CT Simulation planning suite.  Identity was confirmed.  All relevant records and images related to the planned course of therapy were reviewed.   Written consent to proceed with treatment was confirmed which was freely given after reviewing the details related to the planned course of therapy had been reviewed with the patient.  Then, the patient was set-up in a stable reproducible  supine position for radiation therapy.  a customized VAC LOC bag was constructed to aid in patient immobilization during treatment. CT images were obtained.  Surface markings were placed.    The CT images were loaded into the planning software.  Then the target and avoidance structures were contoured.  Treatment planning then occurred.  The radiation prescription was entered and confirmed.  A total of 4 complex treatment devices were fabricated which relate to the designed radiation treatment fields. Each of these customized fields/ complex treatment devices will be used on a daily basis during the radiation course. I have requested : 3D Simulation  I have requested a DVH of the following structures: Gross tumor volume, spinal cord, bowel, liver.   PLAN:  The patient will receive 40 Gy in 16 fractions.  ________________________________   [name], MD, PhD

## 2012-04-26 ENCOUNTER — Other Ambulatory Visit (HOSPITAL_BASED_OUTPATIENT_CLINIC_OR_DEPARTMENT_OTHER): Payer: BC Managed Care – PPO | Admitting: Lab

## 2012-04-26 ENCOUNTER — Ambulatory Visit (HOSPITAL_BASED_OUTPATIENT_CLINIC_OR_DEPARTMENT_OTHER): Payer: BC Managed Care – PPO

## 2012-04-26 ENCOUNTER — Ambulatory Visit (HOSPITAL_BASED_OUTPATIENT_CLINIC_OR_DEPARTMENT_OTHER): Payer: BC Managed Care – PPO | Admitting: Nurse Practitioner

## 2012-04-26 ENCOUNTER — Telehealth: Payer: Self-pay | Admitting: Oncology

## 2012-04-26 ENCOUNTER — Ambulatory Visit (HOSPITAL_BASED_OUTPATIENT_CLINIC_OR_DEPARTMENT_OTHER): Payer: BC Managed Care – PPO | Admitting: Pharmacist

## 2012-04-26 VITALS — BP 132/83 | HR 61 | Temp 98.3°F | Resp 18 | Ht 69.5 in | Wt 169.7 lb

## 2012-04-26 DIAGNOSIS — C189 Malignant neoplasm of colon, unspecified: Secondary | ICD-10-CM

## 2012-04-26 DIAGNOSIS — I82409 Acute embolism and thrombosis of unspecified deep veins of unspecified lower extremity: Secondary | ICD-10-CM

## 2012-04-26 DIAGNOSIS — Z452 Encounter for adjustment and management of vascular access device: Secondary | ICD-10-CM

## 2012-04-26 DIAGNOSIS — C182 Malignant neoplasm of ascending colon: Secondary | ICD-10-CM

## 2012-04-26 LAB — PROTIME-INR

## 2012-04-26 MED ORDER — HEPARIN SOD (PORK) LOCK FLUSH 100 UNIT/ML IV SOLN
500.0000 [IU] | Freq: Once | INTRAVENOUS | Status: AC
  Administered 2012-04-26: 500 [IU] via INTRAVENOUS
  Filled 2012-04-26: qty 5

## 2012-04-26 MED ORDER — SODIUM CHLORIDE 0.9 % IJ SOLN
10.0000 mL | INTRAMUSCULAR | Status: DC | PRN
  Administered 2012-04-26: 10 mL via INTRAVENOUS
  Filled 2012-04-26: qty 10

## 2012-04-26 NOTE — Progress Notes (Signed)
OFFICE PROGRESS NOTE  Interval history:  Terry Vargas returns as scheduled. He completed the course of radiation on 04/04/2012. He has required no pain medication for several weeks. His pain has completely resolved. He has a good appetite. Energy level is improving. No nausea or vomiting. Bowels moving regularly.   Objective: Blood pressure 132/83, pulse 61, temperature 98.3 F (36.8 C), temperature source Oral, resp. rate 18, height 5' 9.5" (1.765 m), weight 169 lb 11.2 oz (76.975 kg).  Oropharynx is without thrush or ulceration. No palpable cervical, supraclavicular, axillary or inguinal lymph nodes. Lungs are clear. Regular cardiac rhythm. Port-A-Cath site is without erythema. Abdomen is soft and nontender. No organomegaly. Extremities are without edema  Lab Results: Lab Results  Component Value Date   WBC 5.5 07/15/2011   HGB 12.0* 07/15/2011   HCT 34.9* 07/15/2011   MCV 96.1 07/15/2011   PLT 177 07/15/2011    Chemistry:    Chemistry      Component Value Date/Time   NA 137 01/22/2012 1202   NA 139 10/16/2011 0745   K 4.0 01/22/2012 1202   K 4.1 10/16/2011 0745   CL 104 01/22/2012 1202   CL 105 10/16/2011 0745   CO2 23 01/22/2012 1202   CO2 24 10/16/2011 0745   BUN 13.0 01/22/2012 1202   BUN 21 10/16/2011 0745   CREATININE 1.0 01/22/2012 1202   CREATININE 1.00 10/16/2011 0745      Component Value Date/Time   CALCIUM 9.1 01/22/2012 1202   CALCIUM 9.2 10/16/2011 0745   ALKPHOS 72 01/22/2012 1202   ALKPHOS 56 10/16/2011 0745   AST 38* 01/22/2012 1202   AST 38* 10/16/2011 0745   ALT 43 01/22/2012 1202   ALT 46 10/16/2011 0745   BILITOT 0.70 01/22/2012 1202   BILITOT 0.4 10/16/2011 0745       Studies/Results: No results found.  Medications: I have reviewed the patient's current medications.  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  He began adjuvant FOLFOX chemotherapy 12/31/2010. He completed cycle 12 of FOLFOX on  06/03/2011.  #2-PET scan 12/29/2010 with hypermetabolic retroperitoneal and anterior abdominal adenopathy, postoperative changes of the right colectomy with a possible seroma in the anterior abdomen. Patchy activity was noted in the parotid glands, potentially inflammatory in etiology.  -The hypermetabolic retroperitoneal lymph nodes potentially represented metastatic disease. We presented his case at the GI tumor conference and discussed the case with Dr. Dwain Sarna. A decision was made to proceed with "adjuvant" therapy with the plan for a restaging CT at a 3 to four-month interval.  -The restaging CT on 03/23/2011 revealed a decrease in the periaortic lymphadenopathy, no new adenopathy, and no evidence of liver metastases. This suggests the small retroperitoneal lymph nodes may represent metastatic colon cancer versus resolving "inflammatory" nodes.  -Restaging CT 07/13/2011 revealed increased retroperitoneal lymphadenopathy consistent with metastatic disease.  -Restaging PET scan 07/22/2011 confirmed persistent hypermetabolic retroperitoneal lymph nodes. 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 A basal cell carcinoma was recently removed from a right  finger by Dr. Karlyn Agee.  #6-Left leg deep vein thrombosis confirmed on a Doppler ultrasound 01/28/2011, now maintained on Coumadin.  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 He has persistent mild numbness in the fingertips and toes. The  numbness does not interfere with activity.  #8-Mild neutropenia on 03/11/2011, he received Neulasta following cycle #6 of FOLFOX. He also received Neulasta support following cycle 8, cycle 10 and cycle #12.  #9-Thrombocytopenia secondary to chemotherapy-resolved.  #10-low back pain-most likely related to the necrotic celiac node , he completed palliative radiation on 04/04/2012. The pain has resolved.   Disposition-Terry Vargas appears stable. The back pain has completely resolved. We are referring him for restaging CT scans 06/01/2011. He will return for a followup visit several days after the scans to review the results. He will contact the office in the interim with any problems.  Plan reviewed with Dr. Truett Perna.   Lonna Cobb ANP/GNP-BC

## 2012-04-26 NOTE — Telephone Encounter (Signed)
Gave pt appt for lab ,and MD , on January 2014 , gave pt oral contrast , pt will see Dr. Mitzi Hansen December 2013 , pt aware of all apprs

## 2012-04-26 NOTE — Progress Notes (Signed)
INR slightly supratherapeutic (3.1) on 10mg  daily. No changes in meds or diet.  Since patient was therapeutic on 10mg  daily except 5mg  on one day a week previously, will decrease dose slightly to 10mg  daily except 5mg  on Tuesdays.   Recheck INR in ~3 weeks after pt returns from holiday travelling.

## 2012-05-02 ENCOUNTER — Ambulatory Visit: Admission: RE | Admit: 2012-05-02 | Payer: BC Managed Care – PPO | Source: Ambulatory Visit

## 2012-05-02 ENCOUNTER — Encounter: Payer: Self-pay | Admitting: Radiation Oncology

## 2012-05-02 ENCOUNTER — Ambulatory Visit
Admission: RE | Admit: 2012-05-02 | Discharge: 2012-05-02 | Disposition: A | Discharge status: Home / Self Care | Payer: BC Managed Care – PPO | Source: Ambulatory Visit | Attending: Radiation Oncology | Admitting: Radiation Oncology

## 2012-05-02 VITALS — BP 126/81 | HR 81 | Temp 98.6°F | Resp 20 | Wt 169.2 lb

## 2012-05-02 DIAGNOSIS — R011 Cardiac murmur, unspecified: Secondary | ICD-10-CM | POA: Insufficient documentation

## 2012-05-02 DIAGNOSIS — C772 Secondary and unspecified malignant neoplasm of intra-abdominal lymph nodes: Secondary | ICD-10-CM

## 2012-05-02 DIAGNOSIS — Z923 Personal history of irradiation: Secondary | ICD-10-CM | POA: Insufficient documentation

## 2012-05-02 NOTE — Progress Notes (Signed)
Pt here for routine s/p radiation tx FU. Pt denies pain, diarrhea, loss of appetite, is recovering from fatigue.

## 2012-05-02 NOTE — Progress Notes (Signed)
Radiation Oncology         [phone] ________________________________  Name: Terry Vargas MRN: [id number]  Date: 05/02/2012  DOB: [birth year]  Follow-Up Visit Note  CC: Irving Copas, MD  Ladene Artist, MD  Diagnosis:   Metastatic colon cancer  Interval Since Last Radiation:  One month   Narrative:  The patient returns today for routine follow-up. He is done very well since he finished treatment. The patient's pain resolved during treatment and this has continued. He is not taking any strong pain medicine at this time. No nausea, no diarrhea. He did have some fatigue but this has improved. The patient has scans next month for restaging.  ALLERGIES:   has no known allergies.  Meds: Current Outpatient Prescriptions  Medication Sig Dispense Refill  . acetaminophen (TYLENOL) 500 MG tablet Take 1,000 mg by mouth every 6 (six) hours as needed.      Marland Kitchen aspirin 325 MG tablet Take 325 mg by mouth daily.       . cholecalciferol (VITAMIN D) 1000 UNITS tablet Take 1,000 Units by mouth daily.        Marland Kitchen glucosamine-chondroitin 500-400 MG tablet Take 2 tablets by mouth daily.       Marland Kitchen KRILL OIL OMEGA-3 PO Take 300 mg by mouth daily. Pt uses Mega Red Omega 3 Krill Oil      . lidocaine-prilocaine (EMLA) cream Apply topically as needed. Apply to injection site one hour prior to injection.  30 g    . Multiple Vitamin (MULTIVITAMIN) tablet Take 1 tablet by mouth daily.      Marland Kitchen HYDROmorphone (DILAUDID) 4 MG tablet Take 1-2 tablets (4-8 mg total) by mouth every 6 (six) hours as needed for pain (severe).  30 tablet  0  . oxyCODONE (OXY IR/ROXICODONE) 5 MG immediate release tablet Take 1-2 tablets (5-10 mg total) by mouth every 4 (four) hours as needed for pain (DO NOT DRIVE).  100 tablet  0  . warfarin (COUMADIN) 10 MG tablet Take 1 tablet (10 mg total) by mouth daily.  30 tablet  2   No current facility-administered medications for this encounter.   Facility-Administered Medications Ordered  in Other Encounters  Medication Dose Route Frequency Provider Last Rate Last Dose  . dexamethasone (DECADRON) injection 10 mg  10 mg Intravenous Once Ladene Artist, MD      . fluorouracil (ADRUCIL) 4,450 mg in sodium chloride 0.9 % 150 mL chemo infusion  2,400 mg/m2 (Treatment Plan Actual) Intravenous 1 day or 1 dose Ladene Artist, MD   4,450 mg at 05/06/11 1358  . oxaliplatin (ELOXATIN) 160 mg in dextrose 5 % 500 mL chemo infusion  85 mg/m2 (Treatment Plan Actual) Intravenous Once Ladene Artist, MD      . sodium chloride 0.9 % injection 10 mL  10 mL Intracatheter PRN Ladene Artist, MD        Physical Findings: The patient is in no acute distress. Patient is alert and oriented.  weight is 169 lb 3.2 oz (76.749 kg). His oral temperature is 98.6 F (37 C). His blood pressure is 126/81 and his pulse is 81. His respiration is 20. .     Lab Findings: Lab Results  Component Value Date   WBC 5.5 07/15/2011   HGB 12.0* 07/15/2011   HCT 34.9* 07/15/2011   MCV 96.1 07/15/2011   PLT 177 07/15/2011     Radiographic Findings: No results found.  Impression:    The patient is doing  well 1 month after his course of palliative radiotherapy. I am very pleased with his response to treatment with no further pain currently.  Plan:  I discussed with him possible followup options and he elected to followup with me on a when necessary basis. He is closely followed by medical oncology and he knows to contact our office if we can be of any further assistance.   [name], M.D., Ph.D.

## 2012-05-04 ENCOUNTER — Telehealth: Payer: Self-pay | Admitting: Pharmacist

## 2012-05-04 NOTE — Telephone Encounter (Signed)
left msg with new lab/CC appt time.  He is now scheduled for an 0815 lab and 0830 Coumadin Clinic appt on 05/16/12.  Asked pt to call us back at [phone] if this time change poses a conflict.

## 2012-05-04 NOTE — Telephone Encounter (Signed)
Message copied by Jacqlyn Larsen on Wed May 04, 2012  8:42 AM ------      Message from: Tami Ribas      Created: Thu Apr 28, 2012 10:01 AM      Regarding: RE: lab/anticoag for 12/30       Terry Vargas labs to 0815 due to labs and Coumadin to 0830am            Rose            ----- Message -----         From: Davene Costain [place], [place]         Sent: 04/26/2012   4:34 PM           To: Tami Ribas, [facility]      Subject: lab/anticoag for 12/30                                   Please schedule Terry Vargas (MRN [id number]#) for 05/16/12# at 0830# for lab & 0845# for Coumadin Clinic.      Please reply or call Coumadin Clinic Pharmacist if not able to match this date/time       MD is Sherrill#.      Thanks!!

## 2012-05-16 ENCOUNTER — Other Ambulatory Visit (HOSPITAL_BASED_OUTPATIENT_CLINIC_OR_DEPARTMENT_OTHER): Payer: BC Managed Care – PPO

## 2012-05-16 ENCOUNTER — Ambulatory Visit (HOSPITAL_BASED_OUTPATIENT_CLINIC_OR_DEPARTMENT_OTHER): Payer: BC Managed Care – PPO | Admitting: Pharmacist

## 2012-05-16 DIAGNOSIS — I82409 Acute embolism and thrombosis of unspecified deep veins of unspecified lower extremity: Secondary | ICD-10-CM

## 2012-05-16 LAB — POCT INR: INR: 1.1

## 2012-05-16 NOTE — Progress Notes (Signed)
INR = 1.1  Pt took 5 mg/day rather than 10 mg/day; 5 mg once weekly we had instructed him.  He got confused on what he thought he was supposed to take. He traveled over Christmas but took breaks to walk ambulate. No s/sxs of VTE presently. INR subtherapeutic since pt took lower dose than instructed.  I will have pt take 10 mg/day; 5 mg Thursdays. I spoke w/ Dr. Truett Perna & he does not see need for pt to be on Lovenox now since he has been >1 yr since original clotting event. Repeat INR 05/24/12 Marily Lente, Pharm.D.

## 2012-05-18 HISTORY — PX: PORT-A-CATH REMOVAL: SHX5289

## 2012-05-24 ENCOUNTER — Ambulatory Visit (HOSPITAL_BASED_OUTPATIENT_CLINIC_OR_DEPARTMENT_OTHER): Payer: BC Managed Care – PPO | Admitting: Pharmacist

## 2012-05-24 ENCOUNTER — Other Ambulatory Visit (HOSPITAL_BASED_OUTPATIENT_CLINIC_OR_DEPARTMENT_OTHER): Payer: BC Managed Care – PPO | Admitting: Lab

## 2012-05-24 DIAGNOSIS — I82409 Acute embolism and thrombosis of unspecified deep veins of unspecified lower extremity: Secondary | ICD-10-CM

## 2012-05-24 LAB — POCT INR: INR: 2.1

## 2012-05-24 LAB — PROTIME-INR

## 2012-05-24 NOTE — Progress Notes (Signed)
INR therapeutic (2.1) on 10mg  daily except 5mg  on Th. No changes in meds or diet.  Will be having one of his shakes today (which is high in Vitamin K, but part of his usual diet). No complaints. Continue current dose.  Recheck INR in ~10 days.

## 2012-05-30 ENCOUNTER — Ambulatory Visit (HOSPITAL_BASED_OUTPATIENT_CLINIC_OR_DEPARTMENT_OTHER): Payer: BC Managed Care – PPO | Admitting: Pharmacist

## 2012-05-30 ENCOUNTER — Other Ambulatory Visit (HOSPITAL_BASED_OUTPATIENT_CLINIC_OR_DEPARTMENT_OTHER): Payer: BC Managed Care – PPO

## 2012-05-30 DIAGNOSIS — I82409 Acute embolism and thrombosis of unspecified deep veins of unspecified lower extremity: Secondary | ICD-10-CM

## 2012-05-30 DIAGNOSIS — C182 Malignant neoplasm of ascending colon: Secondary | ICD-10-CM

## 2012-05-30 DIAGNOSIS — C189 Malignant neoplasm of colon, unspecified: Secondary | ICD-10-CM

## 2012-05-30 LAB — COMPREHENSIVE METABOLIC PANEL (CC13)
ALT: 42 U/L (ref 0–55)
BUN: 21 mg/dL (ref 7.0–26.0)
CO2: 23 mEq/L (ref 22–29)
Creatinine: 0.9 mg/dL (ref 0.7–1.3)
Total Bilirubin: 0.25 mg/dL (ref 0.20–1.20)

## 2012-05-30 LAB — PROTIME-INR: INR: 2.4 (ref 2.00–3.50)

## 2012-05-30 LAB — POCT INR: INR: 2.4

## 2012-05-31 ENCOUNTER — Encounter (HOSPITAL_COMMUNITY): Payer: Self-pay

## 2012-05-31 ENCOUNTER — Other Ambulatory Visit (HOSPITAL_COMMUNITY): Payer: BC Managed Care – PPO

## 2012-05-31 ENCOUNTER — Ambulatory Visit (HOSPITAL_COMMUNITY)
Admission: RE | Admit: 2012-05-31 | Discharge: 2012-05-31 | Disposition: A | Discharge status: Home / Self Care | Payer: BC Managed Care – PPO | Source: Ambulatory Visit | Attending: Nurse Practitioner | Admitting: Nurse Practitioner

## 2012-05-31 DIAGNOSIS — C189 Malignant neoplasm of colon, unspecified: Secondary | ICD-10-CM | POA: Insufficient documentation

## 2012-05-31 DIAGNOSIS — R911 Solitary pulmonary nodule: Secondary | ICD-10-CM | POA: Insufficient documentation

## 2012-05-31 MED ORDER — IOHEXOL 300 MG/ML  SOLN
100.0000 mL | Freq: Once | INTRAMUSCULAR | Status: AC | PRN
  Administered 2012-05-31: 100 mL via INTRAVENOUS

## 2012-06-01 NOTE — Progress Notes (Signed)
Continue 10mg  daily except 5mg  on Thursdays.  Recheck INR in 6 weeks once next Flush appointment is scheduled. Will cancel coumadin clinic appointment for 06/03/12. Pt will be seen for lab and Dr. Truett Perna apt on 06/03/12

## 2012-06-01 NOTE — Patient Instructions (Signed)
Continue 10mg  daily except 5mg  on Thursdays.  Recheck INR in 6 weeks once next Flush appointment is scheduled.

## 2012-06-03 ENCOUNTER — Ambulatory Visit: Payer: BC Managed Care – PPO | Admitting: Oncology

## 2012-06-03 ENCOUNTER — Telehealth: Payer: Self-pay | Admitting: Oncology

## 2012-06-03 ENCOUNTER — Other Ambulatory Visit: Payer: BC Managed Care – PPO

## 2012-06-03 ENCOUNTER — Ambulatory Visit: Payer: BC Managed Care – PPO

## 2012-06-03 ENCOUNTER — Other Ambulatory Visit: Payer: BC Managed Care – PPO | Admitting: Lab

## 2012-06-03 ENCOUNTER — Ambulatory Visit (HOSPITAL_BASED_OUTPATIENT_CLINIC_OR_DEPARTMENT_OTHER): Payer: BC Managed Care – PPO | Admitting: Oncology

## 2012-06-03 VITALS — BP 137/80 | HR 64 | Temp 98.4°F | Resp 20 | Ht 69.5 in | Wt 176.1 lb

## 2012-06-03 DIAGNOSIS — I82409 Acute embolism and thrombosis of unspecified deep veins of unspecified lower extremity: Secondary | ICD-10-CM

## 2012-06-03 DIAGNOSIS — C189 Malignant neoplasm of colon, unspecified: Secondary | ICD-10-CM

## 2012-06-03 DIAGNOSIS — C182 Malignant neoplasm of ascending colon: Secondary | ICD-10-CM

## 2012-06-03 NOTE — Telephone Encounter (Signed)
gv and printed pt appt schedule for Feb..the patient aware °

## 2012-06-03 NOTE — Progress Notes (Signed)
[facility]    OFFICE PROGRESS NOTE   INTERVAL HISTORY:   He returns as scheduled. He feels well. He continues to exercise regularly. No back pain. Stable neuropathy symptoms in the hands and feet. The feet are painful with activity. He continues Coumadin anticoagulation.  Objective:  Vital signs in last 24 hours:  Blood pressure 137/80, pulse 64, temperature 98.4 F (36.9 C), temperature source Oral, resp. rate 20, height 5' 9.5" (1.765 m), weight 176 lb 1.6 oz (79.878 kg).    HEENT: Neck without mass Lymphatics: No cervical, supraclavicular, axillary, or inguinal nodes Resp: Lungs clear bilaterally Cardio: Regular rate and rhythm GI: No hepatomegaly, no mass, nontender Vascular: No leg edema    Portacath/PICC-without erythema  Lab Results:  PT/INR on 05/30/2012-2.4  X-rays: A CT of the abdomen and pelvis on 05/31/2012 is compared to a CT from 01/27/2012-there is a new 1 cm nodule in the periphery of the left lung. The liver and bilateral adrenal glands are unremarkable. Multiple small lymph nodes throughout the upper abdomen around the celiac axis have decreased slightly in size. A 2.7 x 2.1 center soft tissue lesion immediately lateral to the proximal superior mesenteric artery is similar. Increased stranding around the duodenum   Medications: I have reviewed the patient's current medications.  Assessment/Plan: #1.Stage III (T4, N2) adenocarcinoma of the right colon, status post a right colectomy 11/25/2010.  He began adjuvant FOLFOX chemotherapy 12/31/2010. He completed cycle 12 of FOLFOX on 06/03/2011.  #2-PET scan 12/29/2010 with hypermetabolic retroperitoneal and anterior abdominal adenopathy, postoperative changes of the right colectomy with a possible seroma in the anterior abdomen. Patchy activity was noted in the parotid glands, potentially inflammatory in etiology.  -The hypermetabolic retroperitoneal lymph nodes potentially represented  metastatic disease. We presented Terry case at the GI tumor conference and discussed the case with Dr. Dwain Sarna. A decision was made to proceed with "adjuvant" therapy with the plan for a restaging CT at a 3 to four-month interval.  -The restaging CT on 03/23/2011 revealed a decrease in the periaortic lymphadenopathy, no new adenopathy, and no evidence of liver metastases. This suggests the small retroperitoneal lymph nodes may represent metastatic colon cancer versus resolving "inflammatory" nodes.  -Restaging CT 07/13/2011 revealed increased retroperitoneal lymphadenopathy consistent with metastatic disease.  -Restaging PET scan 07/22/2011 confirmed persistent hypermetabolic retroperitoneal lymph nodes. -Restaging CT 11/12/2011 with a slight increase in the size of retroperitoneal/mesenteric lymph nodes, no other evidence of progressive metastatic disease  -Restaging CT 01/27/2012 with a slight increase in the size of a necrotic celiac node, slight decrease in the para-aortic nodes  -Status post palliative radiation to the abdominal/retroperitoneal lymph nodes completed on 04/04/2012 -Restaging CT 05/31/2012 with a slight decrease in the size of abdominal lymph nodes and a stable mass adjacent to the SMA,? 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 A basal cell carcinoma was recently removed from a right finger by Dr. Karlyn Agee.  #6-Left leg deep vein thrombosis confirmed on a Doppler ultrasound 01/28/2011, now maintained on Coumadin.  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 He has persistent mild numbness in the fingertips and toes. The numbness does not interfere with activity.  #8-Mild neutropenia on 03/11/2011, he received Neulasta following  cycle #6 of FOLFOX. He also received Neulasta support following  cycle 8, cycle 10 and cycle #12.  #9-Thrombocytopenia secondary to chemotherapy-resolved.  #10-low back pain-most likely related to the necrotic celiac node , he completed palliative radiation on 04/04/2012. The pain has resolved.    Disposition:  He appears stable. I reviewed the restaging CT images with Terry Vargas and Terry Vargas. The peripheral left lung lesion may be a benign finding versus a metastasis. We will plan for a restaging CT evaluation in approximately 3 months.  The back pain has resolved and some of the abdominal lymph nodes are smaller. I suspect the stranding around the duodenum is related to radiation.  He will return for an office visit, Port-A-Cath flushed, and PT/INR on 07/11/2012. He knows to contact us in the interim for new symptoms.   Thornton Papas, MD  06/03/2012  2:21 PM

## 2012-07-11 ENCOUNTER — Other Ambulatory Visit (HOSPITAL_BASED_OUTPATIENT_CLINIC_OR_DEPARTMENT_OTHER): Payer: BC Managed Care – PPO

## 2012-07-11 ENCOUNTER — Telehealth: Payer: Self-pay | Admitting: Oncology

## 2012-07-11 ENCOUNTER — Ambulatory Visit (HOSPITAL_BASED_OUTPATIENT_CLINIC_OR_DEPARTMENT_OTHER): Payer: BC Managed Care – PPO | Admitting: Oncology

## 2012-07-11 ENCOUNTER — Ambulatory Visit: Payer: BC Managed Care – PPO

## 2012-07-11 ENCOUNTER — Ambulatory Visit: Payer: BC Managed Care – PPO | Admitting: Pharmacist

## 2012-07-11 VITALS — BP 141/77 | HR 61 | Temp 97.5°F | Resp 18 | Ht 69.5 in | Wt 171.5 lb

## 2012-07-11 DIAGNOSIS — D709 Neutropenia, unspecified: Secondary | ICD-10-CM

## 2012-07-11 DIAGNOSIS — C786 Secondary malignant neoplasm of retroperitoneum and peritoneum: Secondary | ICD-10-CM

## 2012-07-11 DIAGNOSIS — I82409 Acute embolism and thrombosis of unspecified deep veins of unspecified lower extremity: Secondary | ICD-10-CM

## 2012-07-11 DIAGNOSIS — C189 Malignant neoplasm of colon, unspecified: Secondary | ICD-10-CM

## 2012-07-11 DIAGNOSIS — C182 Malignant neoplasm of ascending colon: Secondary | ICD-10-CM

## 2012-07-11 DIAGNOSIS — M549 Dorsalgia, unspecified: Secondary | ICD-10-CM

## 2012-07-11 LAB — PROTIME-INR
INR: 3.3 (ref 2.00–3.50)
Protime: 39.6 Seconds — ABNORMAL HIGH (ref 10.6–13.4)

## 2012-07-11 MED ORDER — WARFARIN SODIUM 10 MG PO TABS: 10.0000 mg | ORAL_TABLET | Freq: Every day | ORAL | Status: DC

## 2012-07-11 MED ORDER — SODIUM CHLORIDE 0.9 % IJ SOLN
10.0000 mL | INTRAMUSCULAR | Status: DC | PRN
  Administered 2012-07-11: 10 mL via INTRAVENOUS
  Filled 2012-07-11: qty 10

## 2012-07-11 MED ORDER — HEPARIN SOD (PORK) LOCK FLUSH 100 UNIT/ML IV SOLN
500.0000 [IU] | Freq: Once | INTRAVENOUS | Status: AC
  Administered 2012-07-11: 500 [IU] via INTRAVENOUS
  Filled 2012-07-11: qty 5

## 2012-07-11 NOTE — Telephone Encounter (Signed)
gv and pritned appt schedule for pt for April

## 2012-07-11 NOTE — Progress Notes (Signed)
[facility]    OFFICE PROGRESS NOTE   INTERVAL HISTORY:   He returns as scheduled. Good appetite and energy level. He continues to work and exercise. For the past 2 weeks he has noted increased low back discomfort, especially at night. He is not taking pain medication. Stable neuropathy symptoms in the hands and feet. No other complaint.  Objective:  Vital signs in last 24 hours:  Blood pressure 141/77, pulse 61, temperature 97.5 F (36.4 C), temperature source Oral, resp. rate 18, height 5' 9.5" (1.765 m), weight 171 lb 8 oz (77.792 kg).    HEENT: neck without mass Lymphatics: no cervical, supraclavicular, axillary, or inguinal nodes Resp: lungs clear bilaterally Cardio: regular rate and rhythm GI: no hepatosplenomegaly, no apparent ascites, no mass, mild tenderness in the mid upper abdomen Vascular: the left lower leg is slightly larger than the right side   Portacath/PICC-without erythema  Lab Results:  PT-INR 3.3   Medications: I have reviewed the patient's current medications.  Assessment/Plan: 1.Stage III (T4, N2) adenocarcinoma of the right colon, status post a right colectomy 11/25/2010. K-ras wild-type. He began adjuvant FOLFOX chemotherapy 12/31/2010. He completed cycle 12 of FOLFOX on 06/03/2011.  #2-PET scan 12/29/2010 with hypermetabolic retroperitoneal and anterior abdominal adenopathy, postoperative changes of the right colectomy with a possible seroma in the anterior abdomen. Patchy activity was noted in the parotid glands, potentially inflammatory in etiology.  -The hypermetabolic retroperitoneal lymph nodes potentially represented metastatic disease. We presented his case at the GI tumor conference and discussed the case with Dr. Dwain Sarna. A decision was made to proceed with "adjuvant" therapy with the plan for a restaging CT at a 3 to four-month interval.  -The restaging CT on 03/23/2011 revealed a decrease in the periaortic  lymphadenopathy, no new adenopathy, and no evidence of liver metastases. This suggests the small retroperitoneal lymph nodes may represent metastatic colon cancer versus resolving "inflammatory" nodes.  -Restaging CT 07/13/2011 revealed increased retroperitoneal lymphadenopathy consistent with metastatic disease.  -Restaging PET scan 07/22/2011 confirmed persistent hypermetabolic retroperitoneal lymph nodes. -Restaging CT 11/12/2011 with a slight increase in the size of retroperitoneal/mesenteric lymph nodes, no other evidence of progressive metastatic disease  -Restaging CT 01/27/2012 with a slight increase in the size of a necrotic celiac node, slight decrease in the para-aortic nodes  -Status post palliative radiation to the abdominal/retroperitoneal lymph nodes completed on 04/04/2012  -Restaging CT 05/31/2012 with a slight decrease in the size of abdominal lymph nodes and a stable mass adjacent to the SMA,? 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 A basal cell carcinoma was recently removed from a right finger by Dr. Karlyn Agee.  #6-Left leg deep vein thrombosis confirmed on a Doppler ultrasound 01/28/2011, now maintained on Coumadin.  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 He has persistent mild numbness in the fingertips and toes. The numbness does not interfere with activity.  #8-Mild neutropenia on 03/11/2011, he received Neulasta following cycle #6 of FOLFOX. He also received Neulasta support following cycle 8, cycle 10 and cycle #12.  #9-Thrombocytopenia secondary to chemotherapy-resolved.  #10-low back pain-most likely related to the necrotic celiac node , he completed palliative radiation on 04/04/2012. The pain has resolved.     Disposition:  The back pain has increased over the past  few weeks. This could be related to progression of the retroperitoneal lymphadenopathy. Mr. Worlds will contact us if the pain worsens. We will followup on the CEA from today. He will return for an office visit and Port-A-Cath flush in 6 weeks. The plan is to obtain a restaging CT evaluation after the next office visit.   Thornton Papas, MD  07/11/2012  9:06 PM

## 2012-07-11 NOTE — Progress Notes (Signed)
INR slightly supratherapeutic (3.3) on 10mg  daily except 5mg  on Thursdays. No missed doses.  No problems with bleeding/bruising. Has had less vit K in diet since he usually has 3-4 nutrition shakes per week containing kale, etc.  Has only had 1-2/week over the last 2 weeks.  This likely explains his elevated INR.  Since wife assures me that his diet will go back to 3 shakes per week, will continue current dose.  Will recheck INR in another 6 weeks once port flush is scheduled.

## 2012-07-15 ENCOUNTER — Other Ambulatory Visit: Payer: Self-pay | Admitting: Pharmacist

## 2012-07-28 ENCOUNTER — Telehealth: Payer: Self-pay | Admitting: *Deleted

## 2012-07-28 ENCOUNTER — Ambulatory Visit: Payer: Self-pay | Admitting: Pharmacist

## 2012-07-28 ENCOUNTER — Other Ambulatory Visit: Payer: Self-pay | Admitting: *Deleted

## 2012-07-28 ENCOUNTER — Other Ambulatory Visit (HOSPITAL_BASED_OUTPATIENT_CLINIC_OR_DEPARTMENT_OTHER): Payer: BC Managed Care – PPO

## 2012-07-28 DIAGNOSIS — C189 Malignant neoplasm of colon, unspecified: Secondary | ICD-10-CM

## 2012-07-28 DIAGNOSIS — C182 Malignant neoplasm of ascending colon: Secondary | ICD-10-CM

## 2012-07-28 DIAGNOSIS — I82409 Acute embolism and thrombosis of unspecified deep veins of unspecified lower extremity: Secondary | ICD-10-CM

## 2012-07-28 LAB — COMPREHENSIVE METABOLIC PANEL (CC13)
ALT: 34 U/L (ref 0–55)
AST: 29 U/L (ref 5–34)
BUN: 15.8 mg/dL (ref 7.0–26.0)
Creatinine: 1 mg/dL (ref 0.7–1.3)
Total Bilirubin: 0.29 mg/dL (ref 0.20–1.20)

## 2012-07-28 LAB — PROTHROMBIN TIME: Prothrombin Time: 36.4 seconds — ABNORMAL HIGH (ref 11.6–15.2)

## 2012-07-28 LAB — POCT INR: INR: 3.97

## 2012-07-28 LAB — PROTIME-INR

## 2012-07-28 LAB — CEA: CEA: <0.5 ng/mL (ref 0.0–5.0)

## 2012-07-28 NOTE — Progress Notes (Signed)
INR sent out and returned at 3.97.  Pt already took Coumadin 5mg  today.  Has appt tomorrow for CT scan and Dr. Truett Perna.  Will check PT/INR in am and adjust dose as necessary.  Possibly change to Coumadin 5mg  M/F and 10mg  other days.

## 2012-07-28 NOTE — Telephone Encounter (Signed)
Made patient aware that he will come here at 4pm today for labs and will pick up his CT contrast afterwards. Scan tomorrow at 7am and will see Dr. Truett Perna at 1130 on 3/14.

## 2012-07-28 NOTE — Telephone Encounter (Signed)
Lower back pain that was rated "1-2" in Feb and not needing medication has increased in intensity and more consistent now. Interrupts his sleep now. Rates about "7-8" and taking oxyir 5 mg + apap 500 mg tab for pain at home (had some oxyir left over from past). No numbness or weakness in legs. Asking if MD needs to see him or just order a CT scan now?

## 2012-07-29 ENCOUNTER — Ambulatory Visit: Payer: BC Managed Care – PPO | Admitting: Pharmacist

## 2012-07-29 ENCOUNTER — Other Ambulatory Visit (HOSPITAL_BASED_OUTPATIENT_CLINIC_OR_DEPARTMENT_OTHER): Payer: BC Managed Care – PPO | Admitting: Lab

## 2012-07-29 ENCOUNTER — Encounter (HOSPITAL_COMMUNITY): Payer: Self-pay

## 2012-07-29 ENCOUNTER — Ambulatory Visit (HOSPITAL_COMMUNITY)
Admission: RE | Admit: 2012-07-29 | Discharge: 2012-07-29 | Disposition: A | Discharge status: Home / Self Care | Payer: BC Managed Care – PPO | Source: Ambulatory Visit | Attending: Oncology | Admitting: Oncology

## 2012-07-29 ENCOUNTER — Telehealth: Payer: Self-pay | Admitting: Oncology

## 2012-07-29 ENCOUNTER — Ambulatory Visit (HOSPITAL_BASED_OUTPATIENT_CLINIC_OR_DEPARTMENT_OTHER): Payer: BC Managed Care – PPO | Admitting: Oncology

## 2012-07-29 VITALS — BP 151/85 | HR 93 | Temp 99.2°F | Resp 20 | Ht 69.5 in | Wt 170.3 lb

## 2012-07-29 DIAGNOSIS — I82409 Acute embolism and thrombosis of unspecified deep veins of unspecified lower extremity: Secondary | ICD-10-CM

## 2012-07-29 DIAGNOSIS — M549 Dorsalgia, unspecified: Secondary | ICD-10-CM

## 2012-07-29 DIAGNOSIS — R599 Enlarged lymph nodes, unspecified: Secondary | ICD-10-CM | POA: Insufficient documentation

## 2012-07-29 DIAGNOSIS — C779 Secondary and unspecified malignant neoplasm of lymph node, unspecified: Secondary | ICD-10-CM

## 2012-07-29 DIAGNOSIS — R911 Solitary pulmonary nodule: Secondary | ICD-10-CM | POA: Insufficient documentation

## 2012-07-29 DIAGNOSIS — C182 Malignant neoplasm of ascending colon: Secondary | ICD-10-CM

## 2012-07-29 DIAGNOSIS — C189 Malignant neoplasm of colon, unspecified: Secondary | ICD-10-CM

## 2012-07-29 LAB — POCT INR: INR: 4.2

## 2012-07-29 LAB — PROTIME-INR

## 2012-07-29 MED ORDER — IOHEXOL 300 MG/ML  SOLN
100.0000 mL | Freq: Once | INTRAMUSCULAR | Status: AC | PRN
  Administered 2012-07-29: 100 mL via INTRAVENOUS

## 2012-07-29 MED ORDER — DIPHENHYDRAMINE HCL 25 MG PO CAPS
25.0000 mg | ORAL_CAPSULE | Freq: Once | ORAL | Status: AC
  Administered 2012-07-29: 25 mg via ORAL

## 2012-07-29 MED ORDER — ACETAMINOPHEN 325 MG PO TABS
650.0000 mg | ORAL_TABLET | Freq: Once | ORAL | Status: AC
  Administered 2012-07-29: 650 mg via ORAL

## 2012-07-29 MED ORDER — OXYCODONE HCL 5 MG PO TABS: 5.0000 mg | ORAL_TABLET | ORAL | Status: DC | PRN

## 2012-07-29 NOTE — Telephone Encounter (Signed)
gv and printed appt schedule for pt for March °

## 2012-07-29 NOTE — Progress Notes (Signed)
[facility]    OFFICE PROGRESS NOTE   INTERVAL HISTORY:   He returns prior to a scheduled visit. He reported increased back pain yesterday and he was scheduled for a CT earlier today. He says the pain is worse in the evening. He takes oxycodone at night for relief of the pain. No other sites of pain. He continues to exercise and work.  He underwent CTs of the chest, abdomen, and pelvis this morning. He noted discomfort at the left upper chest after the Port-A-Cath was injected with contrast. When he left radiology he noted chills and then developed a fever. The fever persists. Prior to this morning he had no fever or pain at the Port-A-Cath site.  He has not noted a rash and denies pruritus. Several weeks ago he noted discomfort in the left arm when the Port-A-Cath was flushed at the cancer Center.  Objective:  Vital signs in last 24 hours:  Blood pressure 151/85, pulse 93, temperature 99.2 F (37.3 C), temperature source Oral, resp. rate 20, height 5' 9.5" (1.765 m), weight 170 lb 4.8 oz (77.248 kg).    HEENT: Neck without mass Resp: Lungs clear bilaterally Cardio: Regular rate and rhythm GI: No hepatomegaly, nontender, no mass Vascular: No leg edema  Skin: Erythematous maculopapular rash at the upper anterior chest and upper back. Confluent erythema at the low neck.   Portacath/PICC-mild fullness surrounding the Port-A-Cath site with mild tenderness. No fluctuance. No confluent erythema surrounding the Port-A-Cath.  Lab Results: PT/INR-4.2  CEA on 07/28/2012-less than 0.5   X-rays: CTs of the chest, abdomen, and pelvis on 07/29/2012-no airspace consolidation or atelectasis, new pulmonary nodule in the lingula measuring 1.1 cm, new enlarged subcarinal node measuring 1.2 cm, bilateral hilar nodes. No focal liver abnormality, multiple enlarged upper abdominal lymph nodes including a new right retrocrural node, a new periaortic node, and a new aortocaval node..  Stable 2.1 cm node along the dorsal surface of the pancreatic tail.     Medications: I have reviewed the patient's current medications.  Assessment/Plan: 1.Stage III (T4, N2) adenocarcinoma of the right colon, status post a right colectomy 11/25/2010. K-ras wild-type.  He began adjuvant FOLFOX chemotherapy 12/31/2010. He completed cycle 12 of FOLFOX on 06/03/2011.  #2-PET scan 12/29/2010 with hypermetabolic retroperitoneal and anterior abdominal adenopathy, postoperative changes of the right colectomy with a possible seroma in the anterior abdomen. Patchy activity was noted in the parotid glands, potentially inflammatory in etiology.  -The hypermetabolic retroperitoneal lymph nodes potentially represented metastatic disease. We presented his case at the GI tumor conference and discussed the case with Dr. Dwain Sarna. A decision was made to proceed with "adjuvant" therapy with the plan for a restaging CT at a 3 to four-month interval.  -The restaging CT on 03/23/2011 revealed a decrease in the periaortic lymphadenopathy, no new adenopathy, and no evidence of liver metastases. This suggests the small retroperitoneal lymph nodes may represent metastatic colon cancer versus resolving "inflammatory" nodes.  -Restaging CT 07/13/2011 revealed increased retroperitoneal lymphadenopathy consistent with metastatic disease.  -Restaging PET scan 07/22/2011 confirmed persistent hypermetabolic retroperitoneal lymph nodes. -Restaging CT 11/12/2011 with a slight increase in the size of retroperitoneal/mesenteric lymph nodes, no other evidence of progressive metastatic disease  -Restaging CT 01/27/2012 with a slight increase in the size of a necrotic celiac node, slight decrease in the para-aortic nodes  -Status post palliative radiation to the abdominal/retroperitoneal lymph nodes completed on 04/04/2012  -Restaging CT 05/31/2012 with a slight decrease in the size of abdominal  lymph nodes and a stable mass adjacent to  the SMA,? New left lung lesion  -Restaging CT 07/29/2012 with new mediastinal/hilar nodes and a new left lingula nodule. New retrocrural and retroperitoneal nodes. 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 A basal cell carcinoma was recently removed from a right finger by Dr. Karlyn Agee.  #6-Left leg deep vein thrombosis confirmed on a Doppler ultrasound 01/28/2011, now maintained on Coumadin.  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 He has persistent mild numbness in the fingertips and toes. The numbness does not interfere with activity.  #8-Mild neutropenia on 03/11/2011, he received Neulasta following cycle #6 of FOLFOX. He also received Neulasta support following cycle 8, cycle 10 and cycle #12.  #9-Thrombocytopenia secondary to chemotherapy-resolved.  #10-low back pain-most likely related to the necrotic celiac node , he completed palliative radiation on 04/04/2012. The pain has resolved. He now has progressive back pain-? Secondary to retrocrural/retroperitoneal node #11-fever/rash-? Contrast reaction #12-clinical and CT evidence of a fracture Port-A-Cath  Disposition:  He presented for evaluation increased back pain. The restaging CT confirms progressive disease involving chest and abdominal lymph nodes. I suspect the pain is related to retrocrural/retroperitoneal lymphadenopathy.  He developed a fever after the Port-A-Cath was injected today. He has a maculopapular rash at the upper chest. Mr. Fahr says this was present prior to the CT scan. He denies pruritus. He noted discomfort when the Port-A-Cath was injected a few weeks ago. He also reported discomfort at the Port-A-Cath sites a few weeks ago after lifting weights. The CT indicates extravasation of  contrast from the Port-A-Cath.  He was evaluated by myself and Dr. Carolynne Edouard. We have a low clinical suspicion for a Port-A-Cath infection. The PT/INR is supratherapeutic. He will hold Coumadin for the next 3 days and return for a PT/INR on the morning of 08/01/2012. Dr. Dwain Sarna will remove Port-A-Cath when the PT/INR is acceptable. He knows to contact us in the interim for a high fever or increased pain at the Port-A-Cath site.  He will return for an office visit in medical oncology on 08/08/2012. We will discuss resuming systemic therapy with an irinotecan-based regimen.   Thornton Papas, MD  07/29/2012  6:49 PM

## 2012-07-29 NOTE — Progress Notes (Signed)
INR = 4.2 Pt c/o shaking chills after his CT yesterday.  Has had this ongoing, even now.   Temp = 102.9 Pt seeing Dr. Truett Perna today; I made him aware of pts sxs. Note pts albumin is decreased & pts wife states pt is eating less. INR supratherapeutic.  No coumadin today or tomorrow then take 5 mg on Sunday. Recheck INR Monday AM. Ebony Hail, Pharm.D., CPP 07/29/2012@12 :08 PM

## 2012-08-01 ENCOUNTER — Ambulatory Visit (HOSPITAL_BASED_OUTPATIENT_CLINIC_OR_DEPARTMENT_OTHER): Payer: BC Managed Care – PPO | Admitting: Pharmacist

## 2012-08-01 ENCOUNTER — Telehealth: Payer: Self-pay | Admitting: *Deleted

## 2012-08-01 ENCOUNTER — Other Ambulatory Visit (HOSPITAL_BASED_OUTPATIENT_CLINIC_OR_DEPARTMENT_OTHER): Payer: BC Managed Care – PPO | Admitting: Lab

## 2012-08-01 ENCOUNTER — Other Ambulatory Visit (INDEPENDENT_AMBULATORY_CARE_PROVIDER_SITE_OTHER): Payer: Self-pay | Admitting: General Surgery

## 2012-08-01 ENCOUNTER — Encounter (HOSPITAL_COMMUNITY): Payer: Self-pay | Admitting: Pharmacy Technician

## 2012-08-01 ENCOUNTER — Telehealth (INDEPENDENT_AMBULATORY_CARE_PROVIDER_SITE_OTHER): Payer: Self-pay | Admitting: General Surgery

## 2012-08-01 DIAGNOSIS — I82409 Acute embolism and thrombosis of unspecified deep veins of unspecified lower extremity: Secondary | ICD-10-CM

## 2012-08-01 DIAGNOSIS — R509 Fever, unspecified: Secondary | ICD-10-CM

## 2012-08-01 LAB — PROTIME-INR
INR: 1.2 — ABNORMAL LOW (ref 2.00–3.50)
Protime: 14.4 Seconds — ABNORMAL HIGH (ref 10.6–13.4)

## 2012-08-01 NOTE — Telephone Encounter (Signed)
Calling to let us know they checked patient's INR today and it is 1.2. Patient needs his non-functioning PAC out asap. I told them I would let Dr Dwain Sarna know.

## 2012-08-01 NOTE — Telephone Encounter (Signed)
Called Darl Pikes back and [facility] letting her know patient is scheduled for Thursday for [facility] removal and Dr Dwain Sarna is aware that it is planned this day. She is to call back with any other questions.

## 2012-08-01 NOTE — Progress Notes (Signed)
Pt has been holding coumadin since 07/29/12 in preparation of port removal due to "cracked port".  Darl Pikes, RN will set up appt with Dr. Dwain Sarna to have port removed.  Pt to resume Coumadin evening of port removal at Coumadin 10mg  daily and 5mg  on Thurs.  Will check PT/INR on 08/08/12.  No lovenox coverage needed.  Plan discussed with Dr. Truett Perna.

## 2012-08-01 NOTE — Telephone Encounter (Signed)
Notified Jade in nursing office at [facility] of INR 1.2 today. OK for PAC removal. Please make Dr. Dwain Sarna aware.

## 2012-08-02 ENCOUNTER — Encounter (HOSPITAL_COMMUNITY): Payer: Self-pay

## 2012-08-02 ENCOUNTER — Encounter (INDEPENDENT_AMBULATORY_CARE_PROVIDER_SITE_OTHER): Payer: Self-pay | Admitting: General Surgery

## 2012-08-02 ENCOUNTER — Ambulatory Visit (INDEPENDENT_AMBULATORY_CARE_PROVIDER_SITE_OTHER): Payer: BC Managed Care – PPO | Admitting: General Surgery

## 2012-08-02 VITALS — BP 146/82 | HR 68 | Temp 97.6°F | Resp 24 | Ht 69.0 in | Wt 168.0 lb

## 2012-08-02 DIAGNOSIS — T8241XA Breakdown (mechanical) of vascular dialysis catheter, initial encounter: Secondary | ICD-10-CM

## 2012-08-02 DIAGNOSIS — T82518A Breakdown (mechanical) of other cardiac and vascular devices and implants, initial encounter: Secondary | ICD-10-CM

## 2012-08-02 DIAGNOSIS — T82598A Other mechanical complication of other cardiac and vascular devices and implants, initial encounter: Secondary | ICD-10-CM

## 2012-08-02 NOTE — Progress Notes (Signed)
Patient ID: Terry Vargas, male   DOB: May 25, 1960, 52 y.o.   MRN: [id number]  Chief Complaint  Patient presents with  . Follow-up    discuss PAC removal    HPI Terry Vargas is a 52 y.o. male.   HPI 9 yom I know well from right colectomy for locally advanced colon cancer.  He subsequently was noted to have RP adenopathy for which he has been treated with systemic chemotherapy as well as palliative xrt for back pain.  He also is on coumadin for dvt.  He otherwise is active and doing well right now except for return of some back pain.  He recently had flush at cancer center noting difficult access of port and then pain down left arm.  Since then he has had some pain and on a ct scan had pain around port as well as noting extravasation on the ct scan.  He also had shaking chills and a fever around this too.  Today he has no fever or chills. He comes in to discuss removal.  Past Medical History  Diagnosis Date  . Abdominal mass, RUQ (right upper quadrant)   . Heart murmur   . DVT (deep venous thrombosis) 01/28/11    left leg  . History of radiation therapy 03/14/12 - 04/04/12    retroperitoneal/abdominal region  . Colon cancer 12/02/2010    invasive adenocarcinoma, non-polyposis  . Skin cancer     squamous cell-L ear, basal cell- face/chest    Past Surgical History  Procedure Laterality Date  . Mohs surgery  1996    left ear- squamous cell  . Open right colectomy  11/25/10    hemicolectomy w/appendectomy  . Tonsillectomy      as child    Family History  Problem Relation Age of Onset  . Colon cancer Paternal Grandmother   . Prostate cancer Paternal Grandfather   . Colon cancer Cousin 35    paternal  . Colon cancer Cousin 83    paternal  . Kidney cancer Father   . Colon cancer Father     53, 22  . Skin cancer Father     squamous cell-forehead, nose  . Cancer Father     kidney, colon    Social History History  Substance Use Topics  . Smoking status: Never Smoker    . Smokeless tobacco: Never Used  . Alcohol Use: Yes     Comment: few beers socially    No Known Allergies  Current Outpatient Prescriptions  Medication Sig Dispense Refill  . acetaminophen (TYLENOL) 500 MG tablet Take 1,000 mg by mouth every 6 (six) hours as needed for pain.       Marland Kitchen aspirin 325 MG tablet Take 325 mg by mouth daily.       . cholecalciferol (VITAMIN D) 1000 UNITS tablet Take 1,000 Units by mouth daily.        Marland Kitchen glucosamine-chondroitin 500-400 MG tablet Take 2 tablets by mouth daily.       Marland Kitchen KRILL OIL OMEGA-3 PO Take 300 mg by mouth daily. Pt uses Mega Red Omega 3 Krill Oil      . lidocaine-prilocaine (EMLA) cream Apply topically as needed. Apply to injection site one hour prior to injection.  30 g    . Multiple Vitamin (MULTIVITAMIN) tablet Take 1 tablet by mouth daily.      Marland Kitchen oxyCODONE (OXY IR/ROXICODONE) 5 MG immediate release tablet Take 1 tablet (5 mg total) by mouth every 4 (four) hours as  needed for pain.  60 tablet  0  . warfarin (COUMADIN) 10 MG tablet Take 1 tablet (10 mg total) by mouth daily. 5 mg on Thursdays  30 tablet  5   No current facility-administered medications for this visit.   Facility-Administered Medications Ordered in Other Visits  Medication Dose Route Frequency Provider Last Rate Last Dose  . dexamethasone (DECADRON) injection 10 mg  10 mg Intravenous Once Terry Artist, MD      . fluorouracil (ADRUCIL) 4,450 mg in sodium chloride 0.9 % 150 mL chemo infusion  2,400 mg/m2 (Treatment Plan Actual) Intravenous 1 day or 1 dose Terry Artist, MD   4,450 mg at 05/06/11 1358  . oxaliplatin (ELOXATIN) 160 mg in dextrose 5 % 500 mL chemo infusion  85 mg/m2 (Treatment Plan Actual) Intravenous Once Terry Artist, MD      . sodium chloride 0.9 % injection 10 mL  10 mL Intracatheter PRN Terry Artist, MD        Review of Systems Review of Systems  Constitutional: Negative for fever, chills and unexpected weight change.  HENT: Negative for hearing  loss, congestion, sore throat, trouble swallowing and voice change.   Eyes: Negative for visual disturbance.  Respiratory: Negative for cough and wheezing.   Cardiovascular: Negative for chest pain, palpitations and leg swelling.  Gastrointestinal: Negative for nausea, vomiting, abdominal pain, diarrhea, constipation, blood in stool, abdominal distention, anal bleeding and rectal pain.  Genitourinary: Negative for hematuria and difficulty urinating.  Musculoskeletal: Negative for arthralgias.  Skin: Negative for rash and wound.  Neurological: Negative for seizures, syncope, weakness and headaches.  Hematological: Negative for adenopathy. Does not bruise/bleed easily.  Psychiatric/Behavioral: Negative for confusion.    Blood pressure 146/82, pulse 68, temperature 97.6 F (36.4 C), temperature source Temporal, resp. rate 24, height 5\' 9"  (1.753 m), weight 168 lb (76.204 kg).  Physical Exam Physical Exam  Vitals reviewed. Constitutional: He appears well-developed and well-nourished.  Cardiovascular: Normal rate, regular rhythm and normal heart sounds.   Pulmonary/Chest: Effort normal and breath sounds normal. He has no wheezes. He has no rales.    Abdominal:    Lymphadenopathy:    He has no cervical adenopathy.    Data Reviewed In retrospect, there is a small amount of contrast material  extraluminal to the portacatheter catheter as it passes under the  clavicle. This is concerning for extravasation of contrast  material which suggests at least partial disruption or fracture of  the underlying catheter.  The findings were discussed with Dr. Truett Vargas by Dr. Archer Vargas via  telephone at approximately noon on 07/29/2012.  **END ADDENDUM** SIGNED BY: Terry Vargas, M.D.      Study Result    *RADIOLOGY REPORT*  Clinical Data: Colon cancer  CT CHEST, ABDOMEN AND PELVIS WITH CONTRAST  Technique: Multidetector CT imaging of the chest, abdomen and  pelvis was performed  following the standard protocol during bolus  administration of intravenous contrast.  Contrast: OMNIPAQUE IOHEXOL 300 MG/ML SOLN  Comparison: 05/31/2012  CT CHEST  Findings:  Lungs/pleura: No pleural effusion identified. No airspace  consolidation or atelectasis. New pulmonary nodule in the lingula  of the left lung measures 1.1 cm, image 38/series 5.  Heart/Mediastinum: Heart size is normal. No pericardial effusion.  1 cm right paratracheal lymph node is identified previously this  measured 0.7 cm.  New enlarged subcarinal lymph node measures 1.2 cm, image 30/series  2. Enlarged right hilar lymph node measures 1.6 cm, image  33/series  2. There is no enlarged left hilar lymph node which  measures 1.3 cm, image 32/series 2.  Bones/Musculoskeletal: Review of the visualized osseous structures  is unremarkable. No worrisome lytic or sclerotic bone lesions.  IMPRESSION:  1. No acute cardiopulmonary abnormalities.  2. Interval development of mediastinal and bilateral hilar  adenopathy. This is worrisome for metastatic disease.  3. New pulmonary nodule in the lingula the left lung measures 1.1  cm.  CT ABDOMEN AND PELVIS  Findings: There is no focal liver abnormality. The gallbladder is  normal. No biliary dilatation. Normal appearance of the pancreas.  The spleen appears normal.  Normal appearance of the adrenal glands. The kidneys are negative.  The urinary bladder is unremarkable. The prostate gland and  seminal vesicles are negative.  Normal caliber of the abdominal aorta. Multiple enlarged upper  abdominal lymph nodes are noted. The right retrocrural lymph node  is new from previous exam measuring 1.1 cm, image 52/series 2.  Lymph node along the dorsal surface of the pancreatic tail measures  2.1 cm, image 67. This is unchanged from previous exam. There is  a periaortic lymph node measuring 1.4 cm, image 72/series 2. This  is new from previous exam. New aortocaval lymph node  measures 1.1  cm, image 70/series 2. No enlarged pelvic or inguinal lymph nodes.  Soft tissue stranding and inflammatory changes within the right  upper quadrant is again identified and appears similar to previous  exam. This may reflect duodenitis. The mid and distal small bowel  loops have a normal appearance without evidence for obstruction.  Postsurgical changes from right hemicolectomy and enterocolonic  anastomoses.  IMPRESSION:  1. No acute findings identified within the abdomen or pelvis.  2. Progression of retroperitoneal and retrocrural adenopathy.  3. Persistent inflammatory changes within the upper abdomen along  the distribution of the duodenal C-loop.     Assessment    Malfunctioned port    Plan    Port removal I discussed port removal with complications of broken catheter requiring removal, infection, and bleeding.  His inr is fine now so will plan on Thursday and restarting coumadin afterwards.  He is considering next step with Dr Terry Vargas.  I think waiting to see what they decide prior to new port insertion is best idea especially in light of fever etc that has just occurred.        Eliska Hamil 08/02/2012, 10:41 AM

## 2012-08-03 ENCOUNTER — Encounter (HOSPITAL_COMMUNITY)
Admission: RE | Admit: 2012-08-03 | Discharge: 2012-08-03 | Disposition: A | Discharge status: Home / Self Care | Payer: BC Managed Care – PPO | Source: Ambulatory Visit | Attending: General Surgery | Admitting: General Surgery

## 2012-08-03 ENCOUNTER — Encounter (HOSPITAL_COMMUNITY): Payer: Self-pay

## 2012-08-03 LAB — BASIC METABOLIC PANEL
BUN: 12 mg/dL (ref 6–23)
Chloride: 97 mEq/L (ref 96–112)
GFR calc Af Amer: >90 mL/min (ref >90)
Potassium: 4.8 mEq/L (ref 3.5–5.1)

## 2012-08-03 LAB — CBC
HCT: 33 % — ABNORMAL LOW (ref 39.0–52.0)
Hemoglobin: 11.6 g/dL — ABNORMAL LOW (ref 13.0–17.0)
MCHC: 35.2 g/dL (ref 30.0–36.0)
WBC: 5.9 10*3/uL (ref 4.0–10.5)

## 2012-08-03 LAB — PROTIME-INR: Prothrombin Time: 13.4 seconds (ref 11.6–15.2)

## 2012-08-03 NOTE — Pre-Procedure Instructions (Signed)
Terry Vargas  08/03/2012   Your procedure is scheduled on:  08-04-2012   Report to [facility] at 11:30 AM. Take [name] to the 3rd floor  Call this number if you have problems the morning of surgery: [phone]   Remember:   Do not eat food or drink liquids after midnight.   Take these medicines the morning of surgery with A SIP OF WATER: pain medication as needed   Do not wear jewelry Do not wear lotions, powders, or perfumes. You may wear deodorant.  Do not shave 48 hours prior to surgery. Men may shave face and neck.  Do not bring valuables to the hospital.  Contacts, dentures or bridgework may not be worn into surgery.  .     Patients discharged the day of surgery will not be allowed to drive home.   Name and phone number of your driver: _______________________   Special Instructions: Shower using CHG 2 nights before surgery and the night before surgery.  If you shower the day of surgery use CHG.  Use special wash - you have one bottle of CHG for all showers.  You should use approximately 1/3 of the bottle for each shower.    Please read over the following fact sheets that you were given: Pain Booklet, Coughing and Deep Breathing and Surgical Site Infection Prevention

## 2012-08-04 ENCOUNTER — Encounter (HOSPITAL_COMMUNITY): Payer: Self-pay | Admitting: *Deleted

## 2012-08-04 ENCOUNTER — Ambulatory Visit (HOSPITAL_COMMUNITY)
Admission: RE | Admit: 2012-08-04 | Discharge: 2012-08-04 | Disposition: A | Discharge status: Home / Self Care | Payer: BC Managed Care – PPO | Source: Ambulatory Visit | Attending: General Surgery | Admitting: General Surgery

## 2012-08-04 ENCOUNTER — Encounter (HOSPITAL_COMMUNITY)
Admission: RE | Disposition: A | Discharge status: Home / Self Care | Payer: Self-pay | Source: Ambulatory Visit | Attending: General Surgery

## 2012-08-04 ENCOUNTER — Encounter (HOSPITAL_COMMUNITY): Payer: Self-pay | Admitting: Anesthesiology

## 2012-08-04 ENCOUNTER — Ambulatory Visit (HOSPITAL_COMMUNITY): Payer: BC Managed Care – PPO | Admitting: Anesthesiology

## 2012-08-04 DIAGNOSIS — Z452 Encounter for adjustment and management of vascular access device: Secondary | ICD-10-CM

## 2012-08-04 DIAGNOSIS — C801 Malignant (primary) neoplasm, unspecified: Secondary | ICD-10-CM | POA: Insufficient documentation

## 2012-08-04 DIAGNOSIS — Z7901 Long term (current) use of anticoagulants: Secondary | ICD-10-CM | POA: Insufficient documentation

## 2012-08-04 DIAGNOSIS — Z9049 Acquired absence of other specified parts of digestive tract: Secondary | ICD-10-CM | POA: Insufficient documentation

## 2012-08-04 DIAGNOSIS — C189 Malignant neoplasm of colon, unspecified: Secondary | ICD-10-CM | POA: Insufficient documentation

## 2012-08-04 DIAGNOSIS — R599 Enlarged lymph nodes, unspecified: Secondary | ICD-10-CM | POA: Insufficient documentation

## 2012-08-04 HISTORY — PX: PORT-A-CATH REMOVAL: SHX5289

## 2012-08-04 SURGERY — REMOVAL PORT-A-CATH
Anesthesia: Monitor Anesthesia Care | Site: Chest | Laterality: Left | Wound class: Clean

## 2012-08-04 MED ORDER — BUPIVACAINE-EPINEPHRINE PF 0.25-1:200000 % IJ SOLN
INTRAMUSCULAR | Status: AC
  Filled 2012-08-04: qty 60

## 2012-08-04 MED ORDER — FENTANYL CITRATE 0.05 MG/ML IJ SOLN
INTRAMUSCULAR | Status: DC | PRN
  Administered 2012-08-04: 50 ug via INTRAVENOUS
  Administered 2012-08-04: 100 ug via INTRAVENOUS

## 2012-08-04 MED ORDER — PROPOFOL INFUSION 10 MG/ML OPTIME
INTRAVENOUS | Status: DC | PRN
  Administered 2012-08-04: 100 ug/kg/min via INTRAVENOUS

## 2012-08-04 MED ORDER — OXYCODONE-ACETAMINOPHEN 5-325 MG PO TABS: 1.0000 | ORAL_TABLET | ORAL | Status: DC | PRN

## 2012-08-04 MED ORDER — MIDAZOLAM HCL 2 MG/2ML IJ SOLN: 0.5000 mg | Freq: Once | INTRAMUSCULAR | Status: DC | PRN

## 2012-08-04 MED ORDER — BUPIVACAINE-EPINEPHRINE 0.25% -1:200000 IJ SOLN
INTRAMUSCULAR | Status: DC | PRN
  Administered 2012-08-04: 20 mL

## 2012-08-04 MED ORDER — HYDROMORPHONE HCL PF 1 MG/ML IJ SOLN: 0.2500 mg | INTRAMUSCULAR | Status: DC | PRN

## 2012-08-04 MED ORDER — LIDOCAINE HCL (CARDIAC) 20 MG/ML IV SOLN
INTRAVENOUS | Status: DC | PRN
  Administered 2012-08-04: 50 mg via INTRAVENOUS

## 2012-08-04 MED ORDER — PROMETHAZINE HCL 25 MG/ML IJ SOLN: 6.2500 mg | INTRAMUSCULAR | Status: DC | PRN

## 2012-08-04 MED ORDER — LACTATED RINGERS IV SOLN
INTRAVENOUS | Status: DC
  Administered 2012-08-04: 13:00:00 via INTRAVENOUS

## 2012-08-04 MED ORDER — OXYCODONE HCL 5 MG/5ML PO SOLN: 5.0000 mg | Freq: Once | ORAL | Status: DC | PRN

## 2012-08-04 MED ORDER — MIDAZOLAM HCL 5 MG/5ML IJ SOLN
INTRAMUSCULAR | Status: DC | PRN
  Administered 2012-08-04: 2 mg via INTRAVENOUS

## 2012-08-04 MED ORDER — MEPERIDINE HCL 25 MG/ML IJ SOLN: 6.2500 mg | INTRAMUSCULAR | Status: DC | PRN

## 2012-08-04 MED ORDER — OXYCODONE HCL 5 MG PO TABS: 5.0000 mg | ORAL_TABLET | Freq: Once | ORAL | Status: DC | PRN

## 2012-08-04 MED ORDER — 0.9 % SODIUM CHLORIDE (POUR BTL) OPTIME
TOPICAL | Status: DC | PRN
  Administered 2012-08-04: 1000 mL

## 2012-08-04 MED ORDER — LACTATED RINGERS IV SOLN
INTRAVENOUS | Status: DC | PRN
  Administered 2012-08-04: 13:00:00 via INTRAVENOUS

## 2012-08-04 SURGICAL SUPPLY — 36 items
ADH SKN CLS APL DERMABOND .7 (GAUZE/BANDAGES/DRESSINGS) ×1
BLADE SURG 15 STRL LF DISP TIS (BLADE) ×1 IMPLANT
BLADE SURG 15 STRL SS (BLADE) ×2
CHLORAPREP W/TINT 10.5 ML (MISCELLANEOUS) ×2 IMPLANT
CLOTH BEACON ORANGE TIMEOUT ST (SAFETY) ×2 IMPLANT
COVER SURGICAL LIGHT HANDLE (MISCELLANEOUS) ×2 IMPLANT
DECANTER SPIKE VIAL GLASS SM (MISCELLANEOUS) ×2 IMPLANT
DERMABOND ADVANCED (GAUZE/BANDAGES/DRESSINGS) ×1
DERMABOND ADVANCED .7 DNX12 (GAUZE/BANDAGES/DRESSINGS) ×1 IMPLANT
DRAPE PED LAPAROTOMY (DRAPES) ×2 IMPLANT
ELECT CAUTERY BLADE 6.4 (BLADE) ×2 IMPLANT
ELECT REM PT RETURN 9FT ADLT (ELECTROSURGICAL) ×2
ELECTRODE REM PT RTRN 9FT ADLT (ELECTROSURGICAL) ×1 IMPLANT
GAUZE SPONGE 4X4 16PLY XRAY LF (GAUZE/BANDAGES/DRESSINGS) ×2 IMPLANT
GLOVE BIO SURGEON STRL SZ7 (GLOVE) ×2 IMPLANT
GLOVE BIOGEL PI IND STRL 7.0 (GLOVE) IMPLANT
GLOVE BIOGEL PI IND STRL 7.5 (GLOVE) ×1 IMPLANT
GLOVE BIOGEL PI INDICATOR 7.0 (GLOVE) ×2
GLOVE BIOGEL PI INDICATOR 7.5 (GLOVE) ×1
GLOVE SURG SS PI 7.0 STRL IVOR (GLOVE) ×1 IMPLANT
GOWN STRL NON-REIN LRG LVL3 (GOWN DISPOSABLE) ×4 IMPLANT
KIT BASIN OR (CUSTOM PROCEDURE TRAY) ×2 IMPLANT
KIT ROOM TURNOVER OR (KITS) ×2 IMPLANT
NDL HYPO 25GX1X1/2 BEV (NEEDLE) ×1 IMPLANT
NEEDLE HYPO 25GX1X1/2 BEV (NEEDLE) ×2 IMPLANT
NS IRRIG 1000ML POUR BTL (IV SOLUTION) ×2 IMPLANT
PACK SURGICAL SETUP 50X90 (CUSTOM PROCEDURE TRAY) ×2 IMPLANT
PAD ARMBOARD 7.5X6 YLW CONV (MISCELLANEOUS) ×4 IMPLANT
PENCIL BUTTON HOLSTER BLD 10FT (ELECTRODE) ×2 IMPLANT
SUT MNCRL AB 4-0 PS2 18 (SUTURE) ×2 IMPLANT
SUT VIC AB 3-0 SH 27 (SUTURE) ×2
SUT VIC AB 3-0 SH 27X BRD (SUTURE) ×1 IMPLANT
SYR CONTROL 10ML LL (SYRINGE) ×2 IMPLANT
TOWEL OR 17X24 6PK STRL BLUE (TOWEL DISPOSABLE) ×2 IMPLANT
TOWEL OR 17X26 10 PK STRL BLUE (TOWEL DISPOSABLE) ×2 IMPLANT
WATER STERILE IRR 1000ML POUR (IV SOLUTION) IMPLANT

## 2012-08-04 NOTE — Anesthesia Postprocedure Evaluation (Signed)
  Anesthesia Post-op Note  Patient: Terry Vargas  Procedure(s) Performed: Procedure(s): REMOVAL PORT-A-CATH (Left)  Patient Location: PACU  Anesthesia Type:MAC  Level of Consciousness: awake, alert , oriented and patient cooperative  Airway and Oxygen Therapy: Patient Spontanous Breathing  Post-op Pain: none  Post-op Assessment: Post-op Vital signs reviewed, Patient's Cardiovascular Status Stable, Respiratory Function Stable, Patent Airway, No signs of Nausea or vomiting and Pain level controlled  Post-op Vital Signs: Reviewed and stable  Complications: No apparent anesthesia complications

## 2012-08-04 NOTE — Anesthesia Preprocedure Evaluation (Addendum)
Anesthesia Evaluation  Patient identified by MRN, date of birth, ID band Patient awake    Reviewed: Allergy & Precautions, H&P , NPO status , Patient's Chart, lab work & pertinent test results, reviewed documented beta blocker date and time   History of Anesthesia Complications Negative for: history of anesthetic complications  Airway Mallampati: I TM Distance: >3 FB Neck ROM: Full    Dental  (+) Teeth Intact, Dental Advisory Given and Chipped   Pulmonary neg pulmonary ROS,  breath sounds clear to auscultation  Pulmonary exam normal       Cardiovascular DVT - Peripheral Vascular Disease negative cardio ROS  Rhythm:Regular Rate:Normal     Neuro/Psych negative neurological ROS  negative psych ROS   GI/Hepatic negative GI ROS, Neg liver ROS, Hx colon CA 2012   Endo/Other  negative endocrine ROS  Renal/GU negative Renal ROS     Musculoskeletal negative musculoskeletal ROS (+)   Abdominal   Peds  Hematology  (+) Blood dyscrasia (INR 1.03), ,   Anesthesia Other Findings   Reproductive/Obstetrics negative OB ROS                        Anesthesia Physical Anesthesia Plan  ASA: III  Anesthesia Plan: MAC   Post-op Pain Management:    Induction: Intravenous  Airway Management Planned: Natural Airway and Simple Face Mask  Additional Equipment:   Intra-op Plan:   Post-operative Plan:   Informed Consent: I have reviewed the patients History and Physical, chart, labs and discussed the procedure including the risks, benefits and alternatives for the proposed anesthesia with the patient or authorized representative who has indicated his/her understanding and acceptance.   Dental advisory given  Plan Discussed with: CRNA and Surgeon  Anesthesia Plan Comments: (Plan routine monitors, MAC)        Anesthesia Quick Evaluation

## 2012-08-04 NOTE — H&P (View-Only) (Signed)
Patient ID: Terry Vargas, male   DOB: 03/06/1961, 51 y.o.   MRN: [id number]  Chief Complaint  Patient presents with  . Follow-up    discuss PAC removal    HPI Terry Vargas is a 51 y.o. male.   HPI 51 yom I know well from right colectomy for locally advanced colon cancer.  He subsequently was noted to have RP adenopathy for which he has been treated with systemic chemotherapy as well as palliative xrt for back pain.  He also is on coumadin for dvt.  He otherwise is active and doing well right now except for return of some back pain.  He recently had flush at cancer center noting difficult access of port and then pain down left arm.  Since then he has had some pain and on a ct scan had pain around port as well as noting extravasation on the ct scan.  He also had shaking chills and a fever around this too.  Today he has no fever or chills. He comes in to discuss removal.  Past Medical History  Diagnosis Date  . Abdominal mass, RUQ (right upper quadrant)   . Heart murmur   . DVT (deep venous thrombosis) 01/28/11    left leg  . History of radiation therapy 03/14/12 - 04/04/12    retroperitoneal/abdominal region  . Colon cancer 12/02/2010    invasive adenocarcinoma, non-polyposis  . Skin cancer     squamous cell-L ear, basal cell- face/chest    Past Surgical History  Procedure Laterality Date  . Mohs surgery  1996    left ear- squamous cell  . Open right colectomy  11/25/10    hemicolectomy w/appendectomy  . Tonsillectomy      as child    Family History  Problem Relation Age of Onset  . Colon cancer Paternal Grandmother   . Prostate cancer Paternal Grandfather   . Colon cancer Cousin 35    paternal  . Colon cancer Cousin 33    paternal  . Kidney cancer Father   . Colon cancer Father     1986, 1991  . Skin cancer Father     squamous cell-forehead, nose  . Cancer Father     kidney, colon    Social History History  Substance Use Topics  . Smoking status: Never Smoker    . Smokeless tobacco: Never Used  . Alcohol Use: Yes     Comment: few beers socially    No Known Allergies  Current Outpatient Prescriptions  Medication Sig Dispense Refill  . acetaminophen (TYLENOL) 500 MG tablet Take 1,000 mg by mouth every 6 (six) hours as needed for pain.       . aspirin 325 MG tablet Take 325 mg by mouth daily.       . cholecalciferol (VITAMIN D) 1000 UNITS tablet Take 1,000 Units by mouth daily.        . glucosamine-chondroitin 500-400 MG tablet Take 2 tablets by mouth daily.       . KRILL OIL OMEGA-3 PO Take 300 mg by mouth daily. Pt uses Mega Red Omega 3 Krill Oil      . lidocaine-prilocaine (EMLA) cream Apply topically as needed. Apply to injection site one hour prior to injection.  30 g    . Multiple Vitamin (MULTIVITAMIN) tablet Take 1 tablet by mouth daily.      . oxyCODONE (OXY IR/ROXICODONE) 5 MG immediate release tablet Take 1 tablet (5 mg total) by mouth every 4 (four) hours as   needed for pain.  60 tablet  0  . warfarin (COUMADIN) 10 MG tablet Take 1 tablet (10 mg total) by mouth daily. 5 mg on Thursdays  30 tablet  5   No current facility-administered medications for this visit.   Facility-Administered Medications Ordered in Other Visits  Medication Dose Route Frequency Provider Last Rate Last Dose  . dexamethasone (DECADRON) injection 10 mg  10 mg Intravenous Once Gary B Sherrill, MD      . fluorouracil (ADRUCIL) 4,450 mg in sodium chloride 0.9 % 150 mL chemo infusion  2,400 mg/m2 (Treatment Plan Actual) Intravenous 1 day or 1 dose Gary B Sherrill, MD   4,450 mg at 05/06/11 1358  . oxaliplatin (ELOXATIN) 160 mg in dextrose 5 % 500 mL chemo infusion  85 mg/m2 (Treatment Plan Actual) Intravenous Once Gary B Sherrill, MD      . sodium chloride 0.9 % injection 10 mL  10 mL Intracatheter PRN Gary B Sherrill, MD        Review of Systems Review of Systems  Constitutional: Negative for fever, chills and unexpected weight change.  HENT: Negative for hearing  loss, congestion, sore throat, trouble swallowing and voice change.   Eyes: Negative for visual disturbance.  Respiratory: Negative for cough and wheezing.   Cardiovascular: Negative for chest pain, palpitations and leg swelling.  Gastrointestinal: Negative for nausea, vomiting, abdominal pain, diarrhea, constipation, blood in stool, abdominal distention, anal bleeding and rectal pain.  Genitourinary: Negative for hematuria and difficulty urinating.  Musculoskeletal: Negative for arthralgias.  Skin: Negative for rash and wound.  Neurological: Negative for seizures, syncope, weakness and headaches.  Hematological: Negative for adenopathy. Does not bruise/bleed easily.  Psychiatric/Behavioral: Negative for confusion.    Blood pressure 146/82, pulse 68, temperature 97.6 F (36.4 C), temperature source Temporal, resp. rate 24, height 5' 9" (1.753 m), weight 168 lb (76.204 kg).  Physical Exam Physical Exam  Vitals reviewed. Constitutional: He appears well-developed and well-nourished.  Cardiovascular: Normal rate, regular rhythm and normal heart sounds.   Pulmonary/Chest: Effort normal and breath sounds normal. He has no wheezes. He has no rales.    Abdominal:    Lymphadenopathy:    He has no cervical adenopathy.    Data Reviewed In retrospect, there is a small amount of contrast material  extraluminal to the portacatheter catheter as it passes under the  clavicle. This is concerning for extravasation of contrast  material which suggests at least partial disruption or fracture of  the underlying catheter.  The findings were discussed with Dr. Sherrill by Dr. McCullough via  telephone at approximately noon on 07/29/2012.  **END ADDENDUM** SIGNED BY: Heath K McCullough, M.D.      Study Result    *RADIOLOGY REPORT*  Clinical Data: Colon cancer  CT CHEST, ABDOMEN AND PELVIS WITH CONTRAST  Technique: Multidetector CT imaging of the chest, abdomen and  pelvis was performed  following the standard protocol during bolus  administration of intravenous contrast.  Contrast: 100mL OMNIPAQUE IOHEXOL 300 MG/ML SOLN  Comparison: 05/31/2012  CT CHEST  Findings:  Lungs/pleura: No pleural effusion identified. No airspace  consolidation or atelectasis. New pulmonary nodule in the lingula  of the left lung measures 1.1 cm, image 38/series 5.  Heart/Mediastinum: Heart size is normal. No pericardial effusion.  1 cm right paratracheal lymph node is identified previously this  measured 0.7 cm.  New enlarged subcarinal lymph node measures 1.2 cm, image 30/series  2. Enlarged right hilar lymph node measures 1.6 cm, image  33/series   2. There is no enlarged left hilar lymph node which  measures 1.3 cm, image 32/series 2.  Bones/Musculoskeletal: Review of the visualized osseous structures  is unremarkable. No worrisome lytic or sclerotic bone lesions.  IMPRESSION:  1. No acute cardiopulmonary abnormalities.  2. Interval development of mediastinal and bilateral hilar  adenopathy. This is worrisome for metastatic disease.  3. New pulmonary nodule in the lingula the left lung measures 1.1  cm.  CT ABDOMEN AND PELVIS  Findings: There is no focal liver abnormality. The gallbladder is  normal. No biliary dilatation. Normal appearance of the pancreas.  The spleen appears normal.  Normal appearance of the adrenal glands. The kidneys are negative.  The urinary bladder is unremarkable. The prostate gland and  seminal vesicles are negative.  Normal caliber of the abdominal aorta. Multiple enlarged upper  abdominal lymph nodes are noted. The right retrocrural lymph node  is new from previous exam measuring 1.1 cm, image 52/series 2.  Lymph node along the dorsal surface of the pancreatic tail measures  2.1 cm, image 67. This is unchanged from previous exam. There is  a periaortic lymph node measuring 1.4 cm, image 72/series 2. This  is new from previous exam. New aortocaval lymph node  measures 1.1  cm, image 70/series 2. No enlarged pelvic or inguinal lymph nodes.  Soft tissue stranding and inflammatory changes within the right  upper quadrant is again identified and appears similar to previous  exam. This may reflect duodenitis. The mid and distal small bowel  loops have a normal appearance without evidence for obstruction.  Postsurgical changes from right hemicolectomy and enterocolonic  anastomoses.  IMPRESSION:  1. No acute findings identified within the abdomen or pelvis.  2. Progression of retroperitoneal and retrocrural adenopathy.  3. Persistent inflammatory changes within the upper abdomen along  the distribution of the duodenal C-loop.     Assessment    Malfunctioned port    Plan    Port removal I discussed port removal with complications of broken catheter requiring removal, infection, and bleeding.  His inr is fine now so will plan on Thursday and restarting coumadin afterwards.  He is considering next step with Dr Sherrill.  I think waiting to see what they decide prior to new port insertion is best idea especially in light of fever etc that has just occurred.        Ferron Ishmael 08/02/2012, 10:41 AM    

## 2012-08-04 NOTE — Preoperative (Signed)
Beta Blockers   Reason not to administer Beta Blockers:Not Applicable 

## 2012-08-04 NOTE — Op Note (Signed)
Preoperative diagnosis: Malfunctioning port, possible infection Postoperative diagnosis: saa Procedure: Port removal Surgeon: Dr Harden Mo EBL: minimal Anesthesia: local with iv sedation Complications: none Drains: none Specimen: none Sponge and needle count was correct was two Disposition to recovery stable  Indications: This is a 16 yom who has metastatic colon cancer and had a port placed.  He has been undergoing chemotherapy and recently had difficulty with his port. On a ct scan it appears he has extravasation from line.  He also had temperature of 103 associated with chills at the time. His INR was 4 then. I saw him earlier this week and we discussed removing port.  I don't think it is good idea to place a new one at this time due to infection question.  Procedure: After informed consent was obtained the patient was taken to the operating room.  He had SCDs on. He was placed under general anesthesia.  The area was prepped and draped in the standard sterile surgical fashion.  A surgical timeout was then performed.  I anesthetized the area with .25% marcaine.  I reentered the old incision.  I then removed the port and the line in their entirety.  There was a linear laceration in the line where it was leaking.  Hemostasis was observed.  I then closed this was 3-0 vicryl, 4-0 monocryl and dermabond. He tolerated this well and was transferred to recovery stable

## 2012-08-04 NOTE — Transfer of Care (Signed)
Immediate Anesthesia Transfer of Care Note  Patient: Terry Vargas  Procedure(s) Performed: Procedure(s): REMOVAL PORT-A-CATH (Left)  Patient Location: PACU  Anesthesia Type:MAC  Level of Consciousness: awake, alert  and oriented  Airway & Oxygen Therapy: Patient Spontanous Breathing and Patient connected to nasal cannula oxygen  Post-op Assessment: Report given to PACU RN and Post -op Vital signs reviewed and stable  Post vital signs: Reviewed and stable  Complications: No apparent anesthesia complications

## 2012-08-04 NOTE — Interval H&P Note (Signed)
History and Physical Interval Note:  08/04/2012 1:19 PM  Terry Vargas  has presented today for surgery, with the diagnosis of * No pre-op diagnosis entered *  The various methods of treatment have been discussed with the patient and family. After consideration of risks, benefits and other options for treatment, the patient has consented to  Procedure(s): REMOVAL PORT-A-CATH (Left) as a surgical intervention .  The patient's history has been reviewed, patient examined, no change in status, stable for surgery.  I have reviewed the patient's chart and labs.  Questions were answered to the patient's satisfaction.     [name]

## 2012-08-05 ENCOUNTER — Encounter (HOSPITAL_COMMUNITY): Payer: Self-pay | Admitting: General Surgery

## 2012-08-08 ENCOUNTER — Telehealth (INDEPENDENT_AMBULATORY_CARE_PROVIDER_SITE_OTHER): Payer: Self-pay | Admitting: General Surgery

## 2012-08-08 ENCOUNTER — Telehealth: Payer: Self-pay | Admitting: *Deleted

## 2012-08-08 ENCOUNTER — Ambulatory Visit (HOSPITAL_BASED_OUTPATIENT_CLINIC_OR_DEPARTMENT_OTHER): Payer: BC Managed Care – PPO | Admitting: Pharmacist

## 2012-08-08 ENCOUNTER — Ambulatory Visit (HOSPITAL_BASED_OUTPATIENT_CLINIC_OR_DEPARTMENT_OTHER): Payer: BC Managed Care – PPO | Admitting: Nurse Practitioner

## 2012-08-08 ENCOUNTER — Telehealth: Payer: Self-pay | Admitting: Oncology

## 2012-08-08 ENCOUNTER — Other Ambulatory Visit (HOSPITAL_BASED_OUTPATIENT_CLINIC_OR_DEPARTMENT_OTHER): Payer: BC Managed Care – PPO

## 2012-08-08 VITALS — BP 152/98 | HR 69 | Temp 98.5°F | Resp 18 | Ht 69.0 in | Wt 166.6 lb

## 2012-08-08 DIAGNOSIS — M549 Dorsalgia, unspecified: Secondary | ICD-10-CM

## 2012-08-08 DIAGNOSIS — C182 Malignant neoplasm of ascending colon: Secondary | ICD-10-CM

## 2012-08-08 DIAGNOSIS — R599 Enlarged lymph nodes, unspecified: Secondary | ICD-10-CM

## 2012-08-08 DIAGNOSIS — C50919 Malignant neoplasm of unspecified site of unspecified female breast: Secondary | ICD-10-CM

## 2012-08-08 DIAGNOSIS — I82409 Acute embolism and thrombosis of unspecified deep veins of unspecified lower extremity: Secondary | ICD-10-CM

## 2012-08-08 LAB — PROTIME-INR

## 2012-08-08 LAB — POCT INR: INR: 1.8

## 2012-08-08 MED ORDER — HYDROMORPHONE HCL 4 MG PO TABS: 4.0000 mg | ORAL_TABLET | Freq: Four times a day (QID) | ORAL | Status: DC | PRN

## 2012-08-08 NOTE — Telephone Encounter (Signed)
Spoke with patient po f/u for 08/18/12

## 2012-08-08 NOTE — Progress Notes (Signed)
INR slightly below goal today at 1.8. Pt was supposed to start back on coumadin last Thursday (08/04/12) after port removal with 5 mg that day. But, pt forgot to take the dose on Thursday so he started back taking coumadin on Friday 3/21 with 10 mg. INR is still trending back up. Recommend no changes at this time. Continue same dose of 10 mg daily except for 5 mg on Thursdays. Return in 2 weeks on 08/22/12 with next MD appointment. 8am for lab, 830am for coumadin clinic, and 845 am for MD appointment.

## 2012-08-08 NOTE — Telephone Encounter (Signed)
Per staff phone call and POF I have schedueld appts.  JMW  

## 2012-08-08 NOTE — Patient Instructions (Addendum)
INR slightly below goal but likely due to just starting back on coumadin Continue same dose with no changes: 10 mg daily except for 5 mg on Thursdays Return in 2 weeks at 8am for lab, 830 coumadin clinic, 845 MD visit with Terry Vargas

## 2012-08-08 NOTE — Progress Notes (Signed)
OFFICE PROGRESS NOTE  Interval history:  Terry Vargas returns as scheduled. He underwent Port-A-Cath removal on 08/04/2012. There was a linear laceration in the line.  He continues to have back pain. He is taking oxycodone and Dilaudid. Bowels have been moving less frequently. He does not feel that he is constipated. No nausea or vomiting. No further fevers. The rash at the upper anterior chest and upper back has resolved.     Objective: Blood pressure 152/98, pulse 69, temperature 98.5 F (36.9 C), temperature source Oral, resp. rate 18, height 5\' 9"  (1.753 m), weight 166 lb 9.6 oz (75.569 kg).  Lungs are clear. Regular cardiac rhythm. Abdomen soft and nontender. No hepatomegaly. Extremities are without edema. Scar at the previous Port-A-Cath site is without surrounding erythema. No rash at the upper chest.  Lab Results: Lab Results  Component Value Date   WBC 5.9 08/03/2012   HGB 11.6* 08/03/2012   HCT 33.0* 08/03/2012   MCV 88.7 08/03/2012   PLT 208 08/03/2012    Chemistry:    Chemistry      Component Value Date/Time   NA 136 08/03/2012 0830   NA 139 07/28/2012 1601   K 4.8 08/03/2012 0830   K 3.9 07/28/2012 1601   CL 97 08/03/2012 0830   CL 105 07/28/2012 1601   CO2 30 08/03/2012 0830   CO2 24 07/28/2012 1601   BUN 12 08/03/2012 0830   BUN 15.8 07/28/2012 1601   CREATININE 0.84 08/03/2012 0830   CREATININE 1.0 07/28/2012 1601      Component Value Date/Time   CALCIUM 9.2 08/03/2012 0830   CALCIUM 9.0 07/28/2012 1601   ALKPHOS 140 07/28/2012 1601   ALKPHOS 56 10/16/2011 0745   AST 29 07/28/2012 1601   AST 38* 10/16/2011 0745   ALT 34 07/28/2012 1601   ALT 46 10/16/2011 0745   BILITOT 0.29 07/28/2012 1601   BILITOT 0.4 10/16/2011 0745       Studies/Results: Ct Chest W Contrast  07/29/2012  **ADDENDUM** CREATED: 07/29/2012 13:26:12  In retrospect, there is a small amount of contrast material extraluminal to the portacatheter catheter as it passes under the clavicle.  This is concerning  for extravasation of contrast material which suggests at least partial disruption or fracture of the underlying catheter.  The findings were discussed with Dr. Truett Perna by Dr. Archer Asa via telephone at approximately noon on 07/29/2012.  **END ADDENDUM** SIGNED BY: Sterling Big, M.D.   07/29/2012  *RADIOLOGY REPORT*  Clinical Data:  Colon cancer  CT CHEST, ABDOMEN AND PELVIS WITH CONTRAST  Technique:  Multidetector CT imaging of the chest, abdomen and pelvis was performed following the standard protocol during bolus administration of intravenous contrast.  Contrast: OMNIPAQUE IOHEXOL 300 MG/ML  SOLN  Comparison:  05/31/2012  CT CHEST  Findings:  Lungs/pleura: No pleural effusion identified.  No airspace consolidation or atelectasis.  New pulmonary nodule in the lingula of the left lung measures 1.1 cm, image 38/series 5.  Heart/Mediastinum: Heart size is normal.  No pericardial effusion. 1 cm right paratracheal lymph node is identified previously this measured 0.7 cm. New enlarged subcarinal lymph node measures 1.2 cm, image 30/series 2.  Enlarged right hilar lymph node measures 1.6 cm, image 33/series 2.  There is no enlarged left hilar lymph node which measures 1.3 cm, image 32/series 2.  Bones/Musculoskeletal:  Review of the visualized osseous structures is unremarkable.  No worrisome lytic or sclerotic bone lesions.  IMPRESSION:  1.  No acute cardiopulmonary abnormalities. 2.  Interval development of mediastinal and bilateral hilar adenopathy.  This is worrisome for metastatic disease. 3.  New pulmonary nodule in the lingula the left lung measures 1.1 cm.  CT ABDOMEN AND PELVIS  Findings:  There is no focal liver abnormality.  The gallbladder is normal.  No biliary dilatation.  Normal appearance of the pancreas. The spleen appears normal.  Normal appearance of the adrenal glands.  The kidneys are negative. The urinary bladder is unremarkable.  The prostate gland and seminal vesicles are negative.   Normal caliber of the abdominal aorta.  Multiple enlarged upper abdominal lymph nodes are noted.  The right retrocrural lymph node is new from previous exam measuring 1.1 cm, image 52/series 2. Lymph node along the dorsal surface of the pancreatic tail measures 2.1 cm, image 67.  This is unchanged from previous exam.  There is a periaortic lymph node measuring 1.4 cm, image 72/series 2.  This is new from previous exam.  New aortocaval lymph node measures 1.1 cm, image 70/series 2.  No enlarged pelvic or inguinal lymph nodes.  Soft tissue stranding and inflammatory changes within the right upper quadrant is again identified and appears similar to previous exam.  This may reflect duodenitis.  The mid and distal small bowel loops have a normal appearance without evidence for obstruction. Postsurgical changes from right hemicolectomy and enterocolonic anastomoses.  IMPRESSION:  1.  No acute findings identified within the abdomen or pelvis. 2.  Progression of retroperitoneal and retrocrural adenopathy. 3.  Persistent inflammatory changes within the upper abdomen along the distribution of the duodenal C-loop.  Original Report Authenticated By: Signa Kell, M.D.    Ct Abdomen Pelvis W Contrast  07/29/2012  **ADDENDUM** CREATED: 07/29/2012 13:26:12  In retrospect, there is a small amount of contrast material extraluminal to the portacatheter catheter as it passes under the clavicle.  This is concerning for extravasation of contrast material which suggests at least partial disruption or fracture of the underlying catheter.  The findings were discussed with Dr. Truett Perna by Dr. Archer Asa via telephone at approximately noon on 07/29/2012.  **END ADDENDUM** SIGNED BY: Sterling Big, M.D.   07/29/2012  *RADIOLOGY REPORT*  Clinical Data:  Colon cancer  CT CHEST, ABDOMEN AND PELVIS WITH CONTRAST  Technique:  Multidetector CT imaging of the chest, abdomen and pelvis was performed following the standard protocol during  bolus administration of intravenous contrast.  Contrast: OMNIPAQUE IOHEXOL 300 MG/ML  SOLN  Comparison:  05/31/2012  CT CHEST  Findings:  Lungs/pleura: No pleural effusion identified.  No airspace consolidation or atelectasis.  New pulmonary nodule in the lingula of the left lung measures 1.1 cm, image 38/series 5.  Heart/Mediastinum: Heart size is normal.  No pericardial effusion. 1 cm right paratracheal lymph node is identified previously this measured 0.7 cm. New enlarged subcarinal lymph node measures 1.2 cm, image 30/series 2.  Enlarged right hilar lymph node measures 1.6 cm, image 33/series 2.  There is no enlarged left hilar lymph node which measures 1.3 cm, image 32/series 2.  Bones/Musculoskeletal:  Review of the visualized osseous structures is unremarkable.  No worrisome lytic or sclerotic bone lesions.  IMPRESSION:  1.  No acute cardiopulmonary abnormalities. 2.  Interval development of mediastinal and bilateral hilar adenopathy.  This is worrisome for metastatic disease. 3.  New pulmonary nodule in the lingula the left lung measures 1.1 cm.  CT ABDOMEN AND PELVIS  Findings:  There is no focal liver abnormality.  The gallbladder is normal.  No biliary dilatation.  Normal appearance of the pancreas. The spleen appears normal.  Normal appearance of the adrenal glands.  The kidneys are negative. The urinary bladder is unremarkable.  The prostate gland and seminal vesicles are negative.  Normal caliber of the abdominal aorta.  Multiple enlarged upper abdominal lymph nodes are noted.  The right retrocrural lymph node is new from previous exam measuring 1.1 cm, image 52/series 2. Lymph node along the dorsal surface of the pancreatic tail measures 2.1 cm, image 67.  This is unchanged from previous exam.  There is a periaortic lymph node measuring 1.4 cm, image 72/series 2.  This is new from previous exam.  New aortocaval lymph node measures 1.1 cm, image 70/series 2.  No enlarged pelvic or inguinal lymph  nodes.  Soft tissue stranding and inflammatory changes within the right upper quadrant is again identified and appears similar to previous exam.  This may reflect duodenitis.  The mid and distal small bowel loops have a normal appearance without evidence for obstruction. Postsurgical changes from right hemicolectomy and enterocolonic anastomoses.  IMPRESSION:  1.  No acute findings identified within the abdomen or pelvis. 2.  Progression of retroperitoneal and retrocrural adenopathy. 3.  Persistent inflammatory changes within the upper abdomen along the distribution of the duodenal C-loop.  Original Report Authenticated By: Signa Kell, M.D.     Medications: I have reviewed the patient's current medications.  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 K-ras wild-type.  He began adjuvant FOLFOX chemotherapy 12/31/2010. He completed cycle 12 of FOLFOX on 06/03/2011.  #2-PET scan 12/29/2010 with hypermetabolic retroperitoneal and anterior abdominal adenopathy, postoperative changes of the right colectomy with a possible seroma in the anterior abdomen. Patchy activity was noted in the parotid glands, potentially inflammatory in etiology.  -The hypermetabolic retroperitoneal lymph nodes potentially represented metastatic disease. We presented his case at the GI tumor conference and discussed the case with Dr. Dwain Sarna. A decision was made to proceed with "adjuvant" therapy with the plan for a restaging CT at a 3 to four-month interval.  -The restaging CT on 03/23/2011 revealed a decrease in the periaortic lymphadenopathy, no new adenopathy, and no evidence of liver metastases. This suggests the small retroperitoneal lymph nodes may represent metastatic colon cancer versus resolving "inflammatory" nodes.  -Restaging CT 07/13/2011 revealed increased retroperitoneal lymphadenopathy consistent with metastatic disease.  -Restaging PET  scan 07/22/2011 confirmed persistent hypermetabolic retroperitoneal lymph nodes. -Restaging CT 11/12/2011 with a slight increase in the size of retroperitoneal/mesenteric lymph nodes, no other evidence of progressive metastatic disease  -Restaging CT 01/27/2012 with a slight increase in the size of a necrotic celiac node, slight decrease in the para-aortic nodes  -Status post palliative radiation to the abdominal/retroperitoneal lymph nodes completed on 04/04/2012  -Restaging CT 05/31/2012 with a slight decrease in the size of abdominal lymph nodes and a stable mass adjacent to the SMA,? New left lung lesion  -Restaging CT 07/29/2012 with new mediastinal/hilar nodes and a new left lingula nodule. New retrocrural and retroperitoneal nodes.  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 A basal cell carcinoma was recently removed from a right finger by Dr. Karlyn Agee.  #6-Left leg deep vein thrombosis confirmed on a Doppler ultrasound 01/28/2011, now maintained on Coumadin.  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 He has persistent mild numbness in the fingertips and toes. The numbness does not interfere with activity.  #8-Mild neutropenia on 03/11/2011, he received Neulasta following cycle #6 of FOLFOX. He also received Neulasta support following cycle 8, cycle 10 and cycle #12.  #9-Thrombocytopenia secondary to chemotherapy-resolved.  #10-Low back pain-most likely related to the necrotic celiac node. He completed palliative radiation on 04/04/2012. The pain resolved. He recently presented with progressive back pain-likely secondary to progression of retrocrural/retroperitoneal adenopathy. #11-Fever/rash on 07/29/2012-? contrast reaction.  #12-Clinical and CT evidence of a  fractured Port-A-Cath 07/29/2012. The Port-A-Cath was removed on 08/04/2012. There was a linear laceration in the line.  Disposition-the recent restaging CT scan showed progressive disease involving chest and abdominal lymph nodes. The back pain is likely secondary to retrocrural/retroperitoneal lymphadenopathy. Dr. Truett Perna recommends initiating systemic therapy with FOLFIRI and Panitumumab. We reviewed potential toxicities associated with irinotecan including myelosuppression, nausea, mouth sores, hair loss and diarrhea, possibly severe. We also reviewed potential toxicities associated with Panitumumab including an allergic reaction, diarrhea and a skin rash. We provided written information as well. He is agreeable to proceed. We made a referral to Dr. Dwain Sarna to have a Port-A-Cath placed later this week with plans to initiate cycle 1 FOLFIRI/Avastin on 08/17/2012. We will see Terry Vargas in followup on 08/31/2012 prior to cycle 2. He will contact the office in the interim with any problems.  Patient seen with Dr. Truett Perna.   Lonna Cobb ANP/GNP-BC

## 2012-08-10 ENCOUNTER — Other Ambulatory Visit: Payer: Self-pay | Admitting: Nurse Practitioner

## 2012-08-10 ENCOUNTER — Telehealth: Payer: Self-pay | Admitting: Nurse Practitioner

## 2012-08-10 DIAGNOSIS — C189 Malignant neoplasm of colon, unspecified: Secondary | ICD-10-CM

## 2012-08-10 MED ORDER — MINOCYCLINE HCL 100 MG PO CAPS: 100.0000 mg | ORAL_CAPSULE | Freq: Two times a day (BID) | ORAL | Status: DC

## 2012-08-10 NOTE — Telephone Encounter (Signed)
Notified Terry Vargas prescription sent to pharmacy for minocycline 100 mg twice daily in anticipation of beginning Panitumumab. The Coumadin clinic was also notified.

## 2012-08-11 ENCOUNTER — Telehealth: Payer: Self-pay | Admitting: *Deleted

## 2012-08-11 ENCOUNTER — Other Ambulatory Visit: Payer: Self-pay | Admitting: *Deleted

## 2012-08-11 ENCOUNTER — Other Ambulatory Visit (INDEPENDENT_AMBULATORY_CARE_PROVIDER_SITE_OTHER): Payer: Self-pay | Admitting: General Surgery

## 2012-08-11 NOTE — Progress Notes (Signed)
Called pt with appt to see Dr. Mitzi Hansen 3/31 at 2:30. He is asking when port placement is scheduled. Instructed him to contact Dr. Doreen Salvage office. Chemo is scheduled for 08/17/12.

## 2012-08-11 NOTE — Telephone Encounter (Signed)
Call from pt asking if he could have radiation for his mid-back pain. Oxycodone is effective, but he is unable to take this when he works. Pt is afraid chemo will take too long to improve this pain. Reviewed with Dr. Truett Perna: have pt meet with Dr. Mitzi Hansen to see if he can have radiation to area. Left message for Dr. Joellen Jersey nurse to call pt.

## 2012-08-12 ENCOUNTER — Telehealth: Payer: Self-pay | Admitting: *Deleted

## 2012-08-12 ENCOUNTER — Encounter (HOSPITAL_BASED_OUTPATIENT_CLINIC_OR_DEPARTMENT_OTHER): Payer: Self-pay | Admitting: *Deleted

## 2012-08-12 ENCOUNTER — Telehealth (INDEPENDENT_AMBULATORY_CARE_PROVIDER_SITE_OTHER): Payer: Self-pay | Admitting: General Surgery

## 2012-08-12 NOTE — Telephone Encounter (Signed)
Call from Dr. Doreen Salvage office. First available appt for port placement is 08/17/12. They are unable to schedule due to conflicting chemo appt same time. Chemo will be rescheduled. Pt requires port for tx.

## 2012-08-12 NOTE — Telephone Encounter (Signed)
Per desk RN and POF I have scheduled appts.  JMW

## 2012-08-12 NOTE — Telephone Encounter (Signed)
Pt called to report he has been scheduled to have his PAC replaced on Monday and asked when to stop his Coumadin.  He has taken it today already, so would only leave two days of it being held (instead of the usual five days.)  Spoke with Dr. Dwain Sarna who stated they will check his clotting time on DOS.  Advised pt to not take any more Coumadin or any ASA, Advil (ibuprofen) or Aleve.  May only take narcotic or Tylenol for pain if needed.  He understands and will comply.

## 2012-08-12 NOTE — Telephone Encounter (Signed)
Call from pt, port a cath placement is scheduled for 3/31. Will need to cancel consultation with Dr. Mitzi Hansen. Appt canceled. 4/2 chemo appts reinstated.

## 2012-08-12 NOTE — Progress Notes (Signed)
Needs pt ptt [employer]

## 2012-08-14 ENCOUNTER — Other Ambulatory Visit: Payer: Self-pay | Admitting: Oncology

## 2012-08-15 ENCOUNTER — Ambulatory Visit (HOSPITAL_BASED_OUTPATIENT_CLINIC_OR_DEPARTMENT_OTHER)
Admission: RE | Admit: 2012-08-15 | Discharge: 2012-08-15 | Disposition: A | Discharge status: Home / Self Care | Payer: BC Managed Care – PPO | Source: Ambulatory Visit | Attending: General Surgery | Admitting: General Surgery

## 2012-08-15 ENCOUNTER — Encounter (HOSPITAL_BASED_OUTPATIENT_CLINIC_OR_DEPARTMENT_OTHER): Payer: Self-pay | Admitting: *Deleted

## 2012-08-15 ENCOUNTER — Encounter (HOSPITAL_BASED_OUTPATIENT_CLINIC_OR_DEPARTMENT_OTHER)
Admission: RE | Disposition: A | Discharge status: Home / Self Care | Payer: Self-pay | Source: Ambulatory Visit | Attending: General Surgery

## 2012-08-15 ENCOUNTER — Encounter (HOSPITAL_BASED_OUTPATIENT_CLINIC_OR_DEPARTMENT_OTHER): Payer: Self-pay | Admitting: Anesthesiology

## 2012-08-15 ENCOUNTER — Ambulatory Visit: Payer: BC Managed Care – PPO

## 2012-08-15 ENCOUNTER — Ambulatory Visit (HOSPITAL_COMMUNITY): Payer: BC Managed Care – PPO

## 2012-08-15 ENCOUNTER — Telehealth: Payer: Self-pay | Admitting: Dietician

## 2012-08-15 ENCOUNTER — Ambulatory Visit (HOSPITAL_BASED_OUTPATIENT_CLINIC_OR_DEPARTMENT_OTHER): Payer: BC Managed Care – PPO | Admitting: Anesthesiology

## 2012-08-15 ENCOUNTER — Ambulatory Visit: Payer: BC Managed Care – PPO | Admitting: Radiation Oncology

## 2012-08-15 DIAGNOSIS — Z9221 Personal history of antineoplastic chemotherapy: Secondary | ICD-10-CM | POA: Insufficient documentation

## 2012-08-15 DIAGNOSIS — Z7901 Long term (current) use of anticoagulants: Secondary | ICD-10-CM | POA: Insufficient documentation

## 2012-08-15 DIAGNOSIS — Z7982 Long term (current) use of aspirin: Secondary | ICD-10-CM | POA: Insufficient documentation

## 2012-08-15 DIAGNOSIS — Z79899 Other long term (current) drug therapy: Secondary | ICD-10-CM | POA: Insufficient documentation

## 2012-08-15 DIAGNOSIS — C801 Malignant (primary) neoplasm, unspecified: Secondary | ICD-10-CM | POA: Insufficient documentation

## 2012-08-15 DIAGNOSIS — Z85828 Personal history of other malignant neoplasm of skin: Secondary | ICD-10-CM | POA: Insufficient documentation

## 2012-08-15 DIAGNOSIS — C189 Malignant neoplasm of colon, unspecified: Secondary | ICD-10-CM

## 2012-08-15 DIAGNOSIS — Z86718 Personal history of other venous thrombosis and embolism: Secondary | ICD-10-CM | POA: Insufficient documentation

## 2012-08-15 DIAGNOSIS — Z800 Family history of malignant neoplasm of digestive organs: Secondary | ICD-10-CM | POA: Insufficient documentation

## 2012-08-15 DIAGNOSIS — Z9049 Acquired absence of other specified parts of digestive tract: Secondary | ICD-10-CM | POA: Insufficient documentation

## 2012-08-15 DIAGNOSIS — Z923 Personal history of irradiation: Secondary | ICD-10-CM | POA: Insufficient documentation

## 2012-08-15 DIAGNOSIS — R011 Cardiac murmur, unspecified: Secondary | ICD-10-CM | POA: Insufficient documentation

## 2012-08-15 HISTORY — PX: PORTACATH PLACEMENT: SHX2246

## 2012-08-15 LAB — POCT HEMOGLOBIN-HEMACUE: Hemoglobin: 12.4 g/dL — ABNORMAL LOW (ref 13.0–17.0)

## 2012-08-15 LAB — APTT: aPTT: 45 seconds — ABNORMAL HIGH (ref 24–37)

## 2012-08-15 SURGERY — INSERTION, TUNNELED CENTRAL VENOUS DEVICE, WITH PORT
Anesthesia: General | Site: Chest | Laterality: Left

## 2012-08-15 MED ORDER — FENTANYL CITRATE 0.05 MG/ML IJ SOLN
INTRAMUSCULAR | Status: DC | PRN
  Administered 2012-08-15 (×2): 25 ug via INTRAVENOUS
  Administered 2012-08-15: 100 ug via INTRAVENOUS
  Administered 2012-08-15: 50 ug via INTRAVENOUS

## 2012-08-15 MED ORDER — HYDROMORPHONE HCL 4 MG PO TABS: 2.0000 mg | ORAL_TABLET | Freq: Four times a day (QID) | ORAL | Status: DC | PRN

## 2012-08-15 MED ORDER — OXYCODONE HCL 5 MG PO TABS
5.0000 mg | ORAL_TABLET | Freq: Once | ORAL | Status: AC | PRN
  Administered 2012-08-15: 5 mg via ORAL

## 2012-08-15 MED ORDER — MIDAZOLAM HCL 2 MG/2ML IJ SOLN: 1.0000 mg | INTRAMUSCULAR | Status: DC | PRN

## 2012-08-15 MED ORDER — MIDAZOLAM HCL 2 MG/2ML IJ SOLN: 0.5000 mg | Freq: Once | INTRAMUSCULAR | Status: DC | PRN

## 2012-08-15 MED ORDER — FENTANYL CITRATE 0.05 MG/ML IJ SOLN
25.0000 ug | INTRAMUSCULAR | Status: DC | PRN
  Administered 2012-08-15: 25 ug via INTRAVENOUS
  Administered 2012-08-15: 50 ug via INTRAVENOUS
  Administered 2012-08-15: 25 ug via INTRAVENOUS

## 2012-08-15 MED ORDER — FENTANYL CITRATE 0.05 MG/ML IJ SOLN: 50.0000 ug | INTRAMUSCULAR | Status: DC | PRN

## 2012-08-15 MED ORDER — HEPARIN (PORCINE) IN NACL 2-0.9 UNIT/ML-% IJ SOLN
INTRAMUSCULAR | Status: DC | PRN
  Administered 2012-08-15: 1 via INTRAVENOUS

## 2012-08-15 MED ORDER — OXYCODONE HCL 5 MG/5ML PO SOLN: 5.0000 mg | Freq: Once | ORAL | Status: AC | PRN

## 2012-08-15 MED ORDER — PROPOFOL 10 MG/ML IV BOLUS
INTRAVENOUS | Status: DC | PRN
  Administered 2012-08-15: 200 mg via INTRAVENOUS

## 2012-08-15 MED ORDER — DEXAMETHASONE SODIUM PHOSPHATE 4 MG/ML IJ SOLN
INTRAMUSCULAR | Status: DC | PRN
  Administered 2012-08-15: 10 mg via INTRAVENOUS

## 2012-08-15 MED ORDER — LIDOCAINE HCL (CARDIAC) 20 MG/ML IV SOLN
INTRAVENOUS | Status: DC | PRN
  Administered 2012-08-15: 50 mg via INTRAVENOUS

## 2012-08-15 MED ORDER — BUPIVACAINE HCL (PF) 0.25 % IJ SOLN
INTRAMUSCULAR | Status: DC | PRN
  Administered 2012-08-15: 10 mL

## 2012-08-15 MED ORDER — LACTATED RINGERS IV SOLN
INTRAVENOUS | Status: DC
  Administered 2012-08-15: 14:00:00 via INTRAVENOUS
  Administered 2012-08-15: 10 mL/h via INTRAVENOUS
  Administered 2012-08-15: 16:00:00 via INTRAVENOUS

## 2012-08-15 MED ORDER — CEFAZOLIN SODIUM-DEXTROSE 2-3 GM-% IV SOLR
2.0000 g | INTRAVENOUS | Status: AC
  Administered 2012-08-15: 2 g via INTRAVENOUS

## 2012-08-15 MED ORDER — MIDAZOLAM HCL 2 MG/ML PO SYRP: 0.5000 mg/kg | ORAL_SOLUTION | Freq: Once | ORAL | Status: DC | PRN

## 2012-08-15 MED ORDER — MEPERIDINE HCL 25 MG/ML IJ SOLN: 6.2500 mg | INTRAMUSCULAR | Status: DC | PRN

## 2012-08-15 MED ORDER — HEPARIN SOD (PORK) LOCK FLUSH 100 UNIT/ML IV SOLN
INTRAVENOUS | Status: DC | PRN
  Administered 2012-08-15: 500 [IU] via INTRAVENOUS

## 2012-08-15 MED ORDER — MIDAZOLAM HCL 5 MG/5ML IJ SOLN
INTRAMUSCULAR | Status: DC | PRN
  Administered 2012-08-15: 2 mg via INTRAVENOUS

## 2012-08-15 MED ORDER — PROMETHAZINE HCL 25 MG/ML IJ SOLN: 6.2500 mg | INTRAMUSCULAR | Status: DC | PRN

## 2012-08-15 SURGICAL SUPPLY — 52 items
ADH SKN CLS APL DERMABOND .7 (GAUZE/BANDAGES/DRESSINGS) ×1
APL SKNCLS STERI-STRIP NONHPOA (GAUZE/BANDAGES/DRESSINGS) ×1
BAG DECANTER FOR FLEXI CONT (MISCELLANEOUS) ×2 IMPLANT
BENZOIN TINCTURE PRP APPL 2/3 (GAUZE/BANDAGES/DRESSINGS) ×2 IMPLANT
BLADE SURG 11 STRL SS (BLADE) ×2 IMPLANT
BLADE SURG 15 STRL LF DISP TIS (BLADE) ×1 IMPLANT
BLADE SURG 15 STRL SS (BLADE) ×2
CANISTER SUCTION 1200CC (MISCELLANEOUS) IMPLANT
CHLORAPREP W/TINT 26ML (MISCELLANEOUS) ×2 IMPLANT
CLOTH BEACON ORANGE TIMEOUT ST (SAFETY) ×2 IMPLANT
COVER MAYO STAND STRL (DRAPES) ×2 IMPLANT
COVER TABLE BACK 60X90 (DRAPES) ×2 IMPLANT
DECANTER SPIKE VIAL GLASS SM (MISCELLANEOUS) IMPLANT
DERMABOND ADVANCED (GAUZE/BANDAGES/DRESSINGS) ×1
DERMABOND ADVANCED .7 DNX12 (GAUZE/BANDAGES/DRESSINGS) ×1 IMPLANT
DRAPE C-ARM 42X72 X-RAY (DRAPES) ×2 IMPLANT
DRAPE LAPAROSCOPIC ABDOMINAL (DRAPES) ×2 IMPLANT
DRSG TEGADERM 4X4.75 (GAUZE/BANDAGES/DRESSINGS) IMPLANT
ELECT COATED BLADE 2.86 ST (ELECTRODE) ×2 IMPLANT
ELECT REM PT RETURN 9FT ADLT (ELECTROSURGICAL) ×2
ELECTRODE REM PT RTRN 9FT ADLT (ELECTROSURGICAL) ×1 IMPLANT
GAUZE SPONGE 4X4 12PLY STRL LF (GAUZE/BANDAGES/DRESSINGS) ×2 IMPLANT
GLOVE BIO SURGEON STRL SZ7 (GLOVE) ×3 IMPLANT
GLOVE BIOGEL PI IND STRL 7.0 (GLOVE) IMPLANT
GLOVE BIOGEL PI IND STRL 7.5 (GLOVE) ×1 IMPLANT
GLOVE BIOGEL PI INDICATOR 7.0 (GLOVE) ×1
GLOVE BIOGEL PI INDICATOR 7.5 (GLOVE) ×1
GOWN PREVENTION PLUS XLARGE (GOWN DISPOSABLE) ×4 IMPLANT
IV HEPARIN 1000UNITS/500ML (IV SOLUTION) ×2 IMPLANT
IV KIT MINILOC 20X1 SAFETY (NEEDLE) IMPLANT
KIT PORT POWER 8FR ISP CVUE (Catheter) ×1 IMPLANT
NDL HYPO 25X1 1.5 SAFETY (NEEDLE) ×1 IMPLANT
NDL SAFETY ECLIPSE 18X1.5 (NEEDLE) IMPLANT
NEEDLE HYPO 18GX1.5 SHARP (NEEDLE)
NEEDLE HYPO 25X1 1.5 SAFETY (NEEDLE) ×2 IMPLANT
PACK BASIN DAY SURGERY FS (CUSTOM PROCEDURE TRAY) ×2 IMPLANT
PENCIL BUTTON HOLSTER BLD 10FT (ELECTRODE) ×2 IMPLANT
SLEEVE SCD COMPRESS KNEE MED (MISCELLANEOUS) ×2 IMPLANT
STAPLER VISISTAT 35W (STAPLE) ×2 IMPLANT
STRIP CLOSURE SKIN 1/2X4 (GAUZE/BANDAGES/DRESSINGS) ×2 IMPLANT
SUT MON AB 4-0 PC3 18 (SUTURE) ×3 IMPLANT
SUT PROLENE 2 0 SH DA (SUTURE) ×2 IMPLANT
SUT SILK 2 0 TIES 17X18 (SUTURE)
SUT SILK 2-0 18XBRD TIE BLK (SUTURE) IMPLANT
SUT VIC AB 3-0 SH 27 (SUTURE) ×4
SUT VIC AB 3-0 SH 27X BRD (SUTURE) ×1 IMPLANT
SYR 5ML LUER SLIP (SYRINGE) ×2 IMPLANT
SYR CONTROL 10ML LL (SYRINGE) ×2 IMPLANT
TOWEL OR 17X24 6PK STRL BLUE (TOWEL DISPOSABLE) ×2 IMPLANT
TOWEL OR NON WOVEN STRL DISP B (DISPOSABLE) ×2 IMPLANT
TUBE CONNECTING 20X1/4 (TUBING) IMPLANT
YANKAUER SUCT BULB TIP NO VENT (SUCTIONS) IMPLANT

## 2012-08-15 NOTE — Anesthesia Procedure Notes (Signed)
Procedure Name: LMA Insertion Date/Time: 08/15/2012 4:16 PM Performed by: Zenia Resides D Pre-anesthesia Checklist: Patient identified, Emergency Drugs available, Suction available and Patient being monitored Patient Re-evaluated:Patient Re-evaluated prior to inductionOxygen Delivery Method: Circle System Utilized Preoxygenation: Pre-oxygenation with 100% oxygen Intubation Type: IV induction Ventilation: Mask ventilation without difficulty LMA: LMA inserted LMA Size: 4.0 Number of attempts: 1 Airway Equipment and Method: bite block Placement Confirmation: positive ETCO2 Tube secured with: Tape Dental Injury: Teeth and Oropharynx as per pre-operative assessment

## 2012-08-15 NOTE — H&P (View-Only) (Signed)
Patient ID: Terry Vargas, male   DOB: 05/11/1961, 51 y.o.   MRN: [id number]  Chief Complaint  Patient presents with  . Follow-up    discuss PAC removal    HPI Terry Vargas is a 51 y.o. male.   HPI 51 yom I know well from right colectomy for locally advanced colon cancer.  He subsequently was noted to have RP adenopathy for which he has been treated with systemic chemotherapy as well as palliative xrt for back pain.  He also is on coumadin for dvt.  He otherwise is active and doing well right now except for return of some back pain.  He recently had flush at cancer center noting difficult access of port and then pain down left arm.  Since then he has had some pain and on a ct scan had pain around port as well as noting extravasation on the ct scan.  He also had shaking chills and a fever around this too.  Today he has no fever or chills. He comes in to discuss removal.  Past Medical History  Diagnosis Date  . Abdominal mass, RUQ (right upper quadrant)   . Heart murmur   . DVT (deep venous thrombosis) 01/28/11    left leg  . History of radiation therapy 03/14/12 - 04/04/12    retroperitoneal/abdominal region  . Colon cancer 12/02/2010    invasive adenocarcinoma, non-polyposis  . Skin cancer     squamous cell-L ear, basal cell- face/chest    Past Surgical History  Procedure Laterality Date  . Mohs surgery  1996    left ear- squamous cell  . Open right colectomy  11/25/10    hemicolectomy w/appendectomy  . Tonsillectomy      as child    Family History  Problem Relation Age of Onset  . Colon cancer Paternal Grandmother   . Prostate cancer Paternal Grandfather   . Colon cancer Cousin 35    paternal  . Colon cancer Cousin 33    paternal  . Kidney cancer Father   . Colon cancer Father     1986, 1991  . Skin cancer Father     squamous cell-forehead, nose  . Cancer Father     kidney, colon    Social History History  Substance Use Topics  . Smoking status: Never Smoker    . Smokeless tobacco: Never Used  . Alcohol Use: Yes     Comment: few beers socially    No Known Allergies  Current Outpatient Prescriptions  Medication Sig Dispense Refill  . acetaminophen (TYLENOL) 500 MG tablet Take 1,000 mg by mouth every 6 (six) hours as needed for pain.       . aspirin 325 MG tablet Take 325 mg by mouth daily.       . cholecalciferol (VITAMIN D) 1000 UNITS tablet Take 1,000 Units by mouth daily.        . glucosamine-chondroitin 500-400 MG tablet Take 2 tablets by mouth daily.       . KRILL OIL OMEGA-3 PO Take 300 mg by mouth daily. Pt uses Mega Red Omega 3 Krill Oil      . lidocaine-prilocaine (EMLA) cream Apply topically as needed. Apply to injection site one hour prior to injection.  30 g    . Multiple Vitamin (MULTIVITAMIN) tablet Take 1 tablet by mouth daily.      . oxyCODONE (OXY IR/ROXICODONE) 5 MG immediate release tablet Take 1 tablet (5 mg total) by mouth every 4 (four) hours as   needed for pain.  60 tablet  0  . warfarin (COUMADIN) 10 MG tablet Take 1 tablet (10 mg total) by mouth daily. 5 mg on Thursdays  30 tablet  5   No current facility-administered medications for this visit.   Facility-Administered Medications Ordered in Other Visits  Medication Dose Route Frequency Provider Last Rate Last Dose  . dexamethasone (DECADRON) injection 10 mg  10 mg Intravenous Once Gary B Sherrill, MD      . fluorouracil (ADRUCIL) 4,450 mg in sodium chloride 0.9 % 150 mL chemo infusion  2,400 mg/m2 (Treatment Plan Actual) Intravenous 1 day or 1 dose Gary B Sherrill, MD   4,450 mg at 05/06/11 1358  . oxaliplatin (ELOXATIN) 160 mg in dextrose 5 % 500 mL chemo infusion  85 mg/m2 (Treatment Plan Actual) Intravenous Once Gary B Sherrill, MD      . sodium chloride 0.9 % injection 10 mL  10 mL Intracatheter PRN Gary B Sherrill, MD        Review of Systems Review of Systems  Constitutional: Negative for fever, chills and unexpected weight change.  HENT: Negative for hearing  loss, congestion, sore throat, trouble swallowing and voice change.   Eyes: Negative for visual disturbance.  Respiratory: Negative for cough and wheezing.   Cardiovascular: Negative for chest pain, palpitations and leg swelling.  Gastrointestinal: Negative for nausea, vomiting, abdominal pain, diarrhea, constipation, blood in stool, abdominal distention, anal bleeding and rectal pain.  Genitourinary: Negative for hematuria and difficulty urinating.  Musculoskeletal: Negative for arthralgias.  Skin: Negative for rash and wound.  Neurological: Negative for seizures, syncope, weakness and headaches.  Hematological: Negative for adenopathy. Does not bruise/bleed easily.  Psychiatric/Behavioral: Negative for confusion.    Blood pressure 146/82, pulse 68, temperature 97.6 F (36.4 C), temperature source Temporal, resp. rate 24, height 5' 9" (1.753 m), weight 168 lb (76.204 kg).  Physical Exam Physical Exam  Vitals reviewed. Constitutional: He appears well-developed and well-nourished.  Cardiovascular: Normal rate, regular rhythm and normal heart sounds.   Pulmonary/Chest: Effort normal and breath sounds normal. He has no wheezes. He has no rales.    Abdominal:    Lymphadenopathy:    He has no cervical adenopathy.    Data Reviewed In retrospect, there is a small amount of contrast material  extraluminal to the portacatheter catheter as it passes under the  clavicle. This is concerning for extravasation of contrast  material which suggests at least partial disruption or fracture of  the underlying catheter.  The findings were discussed with Dr. Sherrill by Dr. McCullough via  telephone at approximately noon on 07/29/2012.  **END ADDENDUM** SIGNED BY: Heath K McCullough, M.D.      Study Result    *RADIOLOGY REPORT*  Clinical Data: Colon cancer  CT CHEST, ABDOMEN AND PELVIS WITH CONTRAST  Technique: Multidetector CT imaging of the chest, abdomen and  pelvis was performed  following the standard protocol during bolus  administration of intravenous contrast.  Contrast: 100mL OMNIPAQUE IOHEXOL 300 MG/ML SOLN  Comparison: 05/31/2012  CT CHEST  Findings:  Lungs/pleura: No pleural effusion identified. No airspace  consolidation or atelectasis. New pulmonary nodule in the lingula  of the left lung measures 1.1 cm, image 38/series 5.  Heart/Mediastinum: Heart size is normal. No pericardial effusion.  1 cm right paratracheal lymph node is identified previously this  measured 0.7 cm.  New enlarged subcarinal lymph node measures 1.2 cm, image 30/series  2. Enlarged right hilar lymph node measures 1.6 cm, image  33/series   2. There is no enlarged left hilar lymph node which  measures 1.3 cm, image 32/series 2.  Bones/Musculoskeletal: Review of the visualized osseous structures  is unremarkable. No worrisome lytic or sclerotic bone lesions.  IMPRESSION:  1. No acute cardiopulmonary abnormalities.  2. Interval development of mediastinal and bilateral hilar  adenopathy. This is worrisome for metastatic disease.  3. New pulmonary nodule in the lingula the left lung measures 1.1  cm.  CT ABDOMEN AND PELVIS  Findings: There is no focal liver abnormality. The gallbladder is  normal. No biliary dilatation. Normal appearance of the pancreas.  The spleen appears normal.  Normal appearance of the adrenal glands. The kidneys are negative.  The urinary bladder is unremarkable. The prostate gland and  seminal vesicles are negative.  Normal caliber of the abdominal aorta. Multiple enlarged upper  abdominal lymph nodes are noted. The right retrocrural lymph node  is new from previous exam measuring 1.1 cm, image 52/series 2.  Lymph node along the dorsal surface of the pancreatic tail measures  2.1 cm, image 67. This is unchanged from previous exam. There is  a periaortic lymph node measuring 1.4 cm, image 72/series 2. This  is new from previous exam. New aortocaval lymph node  measures 1.1  cm, image 70/series 2. No enlarged pelvic or inguinal lymph nodes.  Soft tissue stranding and inflammatory changes within the right  upper quadrant is again identified and appears similar to previous  exam. This may reflect duodenitis. The mid and distal small bowel  loops have a normal appearance without evidence for obstruction.  Postsurgical changes from right hemicolectomy and enterocolonic  anastomoses.  IMPRESSION:  1. No acute findings identified within the abdomen or pelvis.  2. Progression of retroperitoneal and retrocrural adenopathy.  3. Persistent inflammatory changes within the upper abdomen along  the distribution of the duodenal C-loop.     Assessment    Malfunctioned port    Plan    Port removal I discussed port removal with complications of broken catheter requiring removal, infection, and bleeding.  His inr is fine now so will plan on Thursday and restarting coumadin afterwards.  He is considering next step with Dr Sherrill.  I think waiting to see what they decide prior to new port insertion is best idea especially in light of fever etc that has just occurred.        Nakina Spatz 08/02/2012, 10:41 AM    

## 2012-08-15 NOTE — Interval H&P Note (Signed)
History and Physical Interval Note:  08/15/2012 3:43 PM He has done well since removal and no signs of infection.  He needs to begin chemotherapy soon again with Dr. Truett Perna.  Will plan on reinsertion of port today.  Terry Vargas  has presented today for surgery, with the diagnosis of Colon Cancer  The various methods of treatment have been discussed with the patient and family. After consideration of risks, benefits and other options for treatment, the patient has consented to  Procedure(s): INSERTION PORT-A-CATH (N/A) as a surgical intervention .  The patient's history has been reviewed, patient examined, no change in status, stable for surgery.  I have reviewed the patient's chart and labs.  Questions were answered to the patient's satisfaction.     Terry Vargas

## 2012-08-15 NOTE — Anesthesia Postprocedure Evaluation (Signed)
  Anesthesia Post-op Note  Patient: Terry Vargas  Procedure(s) Performed: Procedure(s): INSERTION PORT-A-CATH (Left)  Patient Location: PACU  Anesthesia Type:General  Level of Consciousness: awake, alert , oriented and patient cooperative  Airway and Oxygen Therapy: Patient Spontanous Breathing  Post-op Pain: mild  Post-op Assessment: Post-op Vital signs reviewed, Patient's Cardiovascular Status Stable, Respiratory Function Stable, Patent Airway, No signs of Nausea or vomiting and Pain level controlled  Post-op Vital Signs: Reviewed and stable  Complications: No apparent anesthesia complications

## 2012-08-15 NOTE — Op Note (Signed)
Preoperative diagnosis: Metastatic colon cancer Postoperative diagnosis: Same as above Procedure: Attempted right subclavian port Placement of left subclavian power port Surgeon: Dr. Harden Mo Anesthesia: Gen. Estimated blood loss: Minimal Specimens: None Drains: None Complications: Unable to pass wire and catheter on right side necessitated moving to left side. Sponge and needle count correct at end of operation Disposition to recovery stable  Indications: This is a 52 year old male who I know from a colectomy who now has metastatic colon cancer. He is due to begin chemotherapy again. His port malfunctioned and he also had a fever last week so I removed his port at that time. He now needs a new port. He and I discussed placement of a new port.  Procedure: After informed consent was obtained the patient was taken to the operating room. He was given intravenous cefazolin. His INR was 1.4 prior to beginning. He has sequential compression devices on his legs. He was placed under general anesthesia with an LMA. His chest was prepped and draped in the standard sterile surgical fashion. A surgical timeout was then performed.  I accessed his right subclavian vein on the first pass. A wire was placed but this was done with some difficulty. I did watch this under fluoroscopy and did appear to be going into the correct position. Once I had done this and confirmed this I then made a pocket below this. I then attempted to pass the dilator and this was very difficult. I watched this under fluoroscopy and I was able to pass the dilator to the fascia. I pulled this back and then I placed the dilator sheath assembly. I think what was likely happening was this was just bulging up underneath the clavicle and was never actually entering into the vein. I was watching this on fluoro the entire time. After several attempts to do this I became concerned and I pulled this out. I then reaccessed the right subclavian  vein for lateral. I attempted then to place the wire and the wire was not passing very easily. I elected at this point not to continue on the right side as I did not want to cause any harm on this side. He remained stable with normal vital signs throughout the entire procedure. I then accessed his left subclavian vein easily with the first pass. I then dilated this tract up. I then placed the sheath and removed the wire assembly. This was all confirmed by fluoroscopy in the correct position and this passed very easily. I then placed a line and removed the peel-away sheath. I then tunneled the line to the site where we opened his old port incision. I develop the pocket. I then connected the line to the port and sutured this into position with 2-0 Prolene suture. The line aspirated blood and flushed easily. I packed this with heparin. I then closed this with 3-0 Vicryl for Monocryl and Dermabond. I closed the right-sided incision in a similar fashion. Final fluoroscopy showed the line to be in good position with no kinks in it as well as the port in good position. He tolerated this well and was transferred to recovery.

## 2012-08-15 NOTE — Anesthesia Preprocedure Evaluation (Signed)
Anesthesia Evaluation  Patient identified by MRN, date of birth, ID band Patient awake    Reviewed: Allergy & Precautions, H&P , NPO status , Patient's Chart, lab work & pertinent test results  History of Anesthesia Complications Negative for: history of anesthetic complications  Airway Mallampati: I TM Distance: >3 FB Neck ROM: Full    Dental  (+) Teeth Intact, Caps and Dental Advisory Given   Pulmonary neg pulmonary ROS,  breath sounds clear to auscultation  Pulmonary exam normal       Cardiovascular DVT (INR 1.4) Rhythm:Regular Rate:Normal     Neuro/Psych negative neurological ROS  negative psych ROS   GI/Hepatic Neg liver ROS, Colon cancer: surgery, XRT, chemo   Endo/Other  negative endocrine ROS  Renal/GU negative Renal ROS     Musculoskeletal   Abdominal   Peds  Hematology negative hematology ROS (+)   Anesthesia Other Findings   Reproductive/Obstetrics                           Anesthesia Physical Anesthesia Plan  ASA: III  Anesthesia Plan: General   Post-op Pain Management:    Induction: Intravenous  Airway Management Planned: LMA  Additional Equipment:   Intra-op Plan:   Post-operative Plan:   Informed Consent: I have reviewed the patients History and Physical, chart, labs and discussed the procedure including the risks, benefits and alternatives for the proposed anesthesia with the patient or authorized representative who has indicated his/her understanding and acceptance.   Dental advisory given  Plan Discussed with: Surgeon and CRNA  Anesthesia Plan Comments: (Plan routine monitors, GA-LMA OK)        Anesthesia Quick Evaluation

## 2012-08-15 NOTE — Transfer of Care (Signed)
Immediate Anesthesia Transfer of Care Note  Patient: Terry Vargas  Procedure(s) Performed: Procedure(s): INSERTION PORT-A-CATH (Left)  Patient Location: PACU  Anesthesia Type:General  Level of Consciousness: awake  Airway & Oxygen Therapy: Patient Spontanous Breathing and Patient connected to face mask oxygen  Post-op Assessment: Report given to PACU RN and Post -op Vital signs reviewed and stable  Post vital signs: Reviewed and stable  Complications: No apparent anesthesia complications

## 2012-08-16 ENCOUNTER — Encounter (HOSPITAL_BASED_OUTPATIENT_CLINIC_OR_DEPARTMENT_OTHER): Payer: Self-pay | Admitting: General Surgery

## 2012-08-17 ENCOUNTER — Other Ambulatory Visit: Payer: BC Managed Care – PPO | Admitting: Lab

## 2012-08-17 ENCOUNTER — Other Ambulatory Visit (HOSPITAL_BASED_OUTPATIENT_CLINIC_OR_DEPARTMENT_OTHER): Payer: BC Managed Care – PPO

## 2012-08-17 ENCOUNTER — Ambulatory Visit: Payer: BC Managed Care – PPO

## 2012-08-17 ENCOUNTER — Ambulatory Visit (HOSPITAL_BASED_OUTPATIENT_CLINIC_OR_DEPARTMENT_OTHER): Payer: BC Managed Care – PPO

## 2012-08-17 ENCOUNTER — Other Ambulatory Visit: Payer: Self-pay | Admitting: Oncology

## 2012-08-17 VITALS — BP 154/96 | HR 59 | Temp 97.9°F | Resp 20

## 2012-08-17 DIAGNOSIS — C786 Secondary malignant neoplasm of retroperitoneum and peritoneum: Secondary | ICD-10-CM

## 2012-08-17 DIAGNOSIS — C182 Malignant neoplasm of ascending colon: Secondary | ICD-10-CM

## 2012-08-17 DIAGNOSIS — C189 Malignant neoplasm of colon, unspecified: Secondary | ICD-10-CM

## 2012-08-17 DIAGNOSIS — I82409 Acute embolism and thrombosis of unspecified deep veins of unspecified lower extremity: Secondary | ICD-10-CM

## 2012-08-17 DIAGNOSIS — Z5111 Encounter for antineoplastic chemotherapy: Secondary | ICD-10-CM

## 2012-08-17 DIAGNOSIS — C772 Secondary and unspecified malignant neoplasm of intra-abdominal lymph nodes: Secondary | ICD-10-CM

## 2012-08-17 DIAGNOSIS — Z5112 Encounter for antineoplastic immunotherapy: Secondary | ICD-10-CM

## 2012-08-17 LAB — CBC WITH DIFFERENTIAL/PLATELET
Eosinophils Absolute: 0.1 10*3/uL (ref 0.0–0.5)
MCV: 89.7 fL (ref 79.3–98.0)
MONO#: 0.5 10*3/uL (ref 0.1–0.9)
MONO%: 8.2 % (ref 0.0–14.0)
NEUT#: 4.4 10*3/uL (ref 1.5–6.5)
RBC: 3.48 10*6/uL — ABNORMAL LOW (ref 4.20–5.82)
RDW: 12.5 % (ref 11.0–14.6)
WBC: 5.7 10*3/uL (ref 4.0–10.3)
lymph#: 0.7 10*3/uL — ABNORMAL LOW (ref 0.9–3.3)
nRBC: 0 % (ref 0–0)

## 2012-08-17 LAB — COMPREHENSIVE METABOLIC PANEL (CC13)
ALT: 33 U/L (ref 0–55)
CO2: 27 mEq/L (ref 22–29)
Sodium: 138 mEq/L (ref 136–145)
Total Bilirubin: 0.2 mg/dL (ref 0.20–1.20)
Total Protein: 6.6 g/dL (ref 6.4–8.3)

## 2012-08-17 LAB — PROTIME-INR: Protime: 19.2 Seconds — ABNORMAL HIGH (ref 10.6–13.4)

## 2012-08-17 MED ORDER — FLUOROURACIL CHEMO INJECTION 2.5 GM/50ML
400.0000 mg/m2 | Freq: Once | INTRAVENOUS | Status: AC
  Administered 2012-08-17: 750 mg via INTRAVENOUS
  Filled 2012-08-17: qty 15

## 2012-08-17 MED ORDER — SODIUM CHLORIDE 0.9 % IV SOLN
6.0000 mg/kg | Freq: Once | INTRAVENOUS | Status: AC
  Administered 2012-08-17: 460 mg via INTRAVENOUS
  Filled 2012-08-17: qty 23

## 2012-08-17 MED ORDER — SODIUM CHLORIDE 0.9 % IV SOLN
Freq: Once | INTRAVENOUS | Status: AC
  Administered 2012-08-17: 10:00:00 via INTRAVENOUS

## 2012-08-17 MED ORDER — ATROPINE SULFATE 1 MG/ML IJ SOLN
0.5000 mg | Freq: Once | INTRAMUSCULAR | Status: AC | PRN
  Administered 2012-08-17: 0.5 mg via INTRAVENOUS

## 2012-08-17 MED ORDER — ONDANSETRON 16 MG/50ML IVPB (CHCC)
16.0000 mg | Freq: Once | INTRAVENOUS | Status: AC
  Administered 2012-08-17: 16 mg via INTRAVENOUS

## 2012-08-17 MED ORDER — DEXAMETHASONE SODIUM PHOSPHATE 4 MG/ML IJ SOLN
20.0000 mg | Freq: Once | INTRAMUSCULAR | Status: AC
  Administered 2012-08-17: 20 mg via INTRAVENOUS

## 2012-08-17 MED ORDER — LEUCOVORIN CALCIUM INJECTION 350 MG
398.0000 mg/m2 | Freq: Once | INTRAVENOUS | Status: AC
  Administered 2012-08-17: 760 mg via INTRAVENOUS
  Filled 2012-08-17: qty 38

## 2012-08-17 MED ORDER — IRINOTECAN HCL CHEMO INJECTION 100 MG/5ML
178.0000 mg/m2 | Freq: Once | INTRAVENOUS | Status: AC
  Administered 2012-08-17: 340 mg via INTRAVENOUS
  Filled 2012-08-17: qty 17

## 2012-08-17 MED ORDER — SODIUM CHLORIDE 0.9 % IV SOLN
2400.0000 mg/m2 | INTRAVENOUS | Status: DC
  Administered 2012-08-17: 4600 mg via INTRAVENOUS
  Filled 2012-08-17: qty 92

## 2012-08-17 NOTE — Progress Notes (Signed)
1215- Patient complains of "heartburn" in his chest area. States it comes and goes. Leucovorin and Camptosar stopped and NS infused. Patient states he feels clammy and appears pale in color. Dr Truett Perna at bedside. Atropine given. Observed patient for 20 min.   1239- Patient states "heartburn" is gone. Denies any other complaints. Patient ambulated to bathroom without difficulty. Patient denies diarrhea. Skin warm and dry. Camptosar and Leucovorin restarted.

## 2012-08-17 NOTE — Patient Instructions (Addendum)
[facility] Discharge Instructions for Patients Receiving Chemotherapy  Today you received the following chemotherapy agents Vectibix, Camptosar, Leucovorin and 5FU.  To help prevent nausea and vomiting after your treatment, we encourage you to take your nausea medication.   If you develop nausea and vomiting that is not controlled by your nausea medication, call the clinic. If it is after clinic hours your family physician or the after hours number for the clinic or go to the Emergency Department.   BELOW ARE SYMPTOMS THAT SHOULD BE REPORTED IMMEDIATELY:  *FEVER GREATER THAN 100.5 F  *CHILLS WITH OR WITHOUT FEVER  NAUSEA AND VOMITING THAT IS NOT CONTROLLED WITH YOUR NAUSEA MEDICATION  *UNUSUAL SHORTNESS OF BREATH  *UNUSUAL BRUISING OR BLEEDING  TENDERNESS IN MOUTH AND THROAT WITH OR WITHOUT PRESENCE OF ULCERS  *URINARY PROBLEMS  *BOWEL PROBLEMS  UNUSUAL RASH Items with * indicate a potential emergency and should be followed up as soon as possible.  One of the nurses will contact you 24 hours after your treatment. Please let the nurse know about any problems that you may have experienced. Feel free to call the clinic you have any questions or concerns. The clinic phone number is [phone].   I have been informed and understand all the instructions given to me. I know to contact the clinic, my physician, or go to the Emergency Department if any problems should occur. I do not have any questions at this time, but understand that I may call the clinic during office hours   should I have any questions or need assistance in obtaining follow up care.    __________________________________________  _____________  __________ Signature of Patient or Authorized Representative            Date                   Time    __________________________________________ Nurse's Signature

## 2012-08-18 ENCOUNTER — Encounter (INDEPENDENT_AMBULATORY_CARE_PROVIDER_SITE_OTHER): Payer: BC Managed Care – PPO | Admitting: General Surgery

## 2012-08-18 ENCOUNTER — Ambulatory Visit: Payer: BC Managed Care – PPO

## 2012-08-18 ENCOUNTER — Telehealth: Payer: Self-pay | Admitting: *Deleted

## 2012-08-18 NOTE — Telephone Encounter (Signed)
Left VM reporting some swelling in his feet and asking if he needs to be seen tomorrow about this or can it wait until Monday? Will request Flush RN assess tomorrow when he comes in for pump D/C.

## 2012-08-18 NOTE — Telephone Encounter (Signed)
PT. WAS CONCERNED THAT HIS FEET WERE SWELLING. THIS PROBLEM WILL BE ASSESSED TOMORROW BY THE FLUSH NURSE. NO OTHER PROBLEMS OR QUESTIONS MENTIONED. PT. WILL CALL THIS OFFICE OR THE ON CALL PHYSICIAN SHOULD THE NEED ARISE.

## 2012-08-19 ENCOUNTER — Ambulatory Visit (HOSPITAL_BASED_OUTPATIENT_CLINIC_OR_DEPARTMENT_OTHER): Payer: BC Managed Care – PPO

## 2012-08-19 VITALS — BP 121/77 | HR 64 | Temp 97.1°F | Resp 20

## 2012-08-19 DIAGNOSIS — C189 Malignant neoplasm of colon, unspecified: Secondary | ICD-10-CM

## 2012-08-19 DIAGNOSIS — R609 Edema, unspecified: Secondary | ICD-10-CM

## 2012-08-19 DIAGNOSIS — C182 Malignant neoplasm of ascending colon: Secondary | ICD-10-CM

## 2012-08-19 MED ORDER — SODIUM CHLORIDE 0.9 % IJ SOLN
10.0000 mL | INTRAMUSCULAR | Status: DC | PRN
  Administered 2012-08-19: 10 mL
  Filled 2012-08-19: qty 10

## 2012-08-19 MED ORDER — HEPARIN SOD (PORK) LOCK FLUSH 100 UNIT/ML IV SOLN
500.0000 [IU] | Freq: Once | INTRAVENOUS | Status: AC | PRN
  Administered 2012-08-19: 500 [IU]
  Filled 2012-08-19: qty 5

## 2012-08-19 NOTE — Progress Notes (Signed)
Assessed lower legs, slight pitting edema noted in ankles and lower calf bilaterally. No warmth or redness. Pt denies pain. Pt states the swelling is much better than it was Wednesday night. Pt also states there was no swelling in either leg or ankle this morning. To call us if he has any pain or notices redness. To see Dr Truett Perna on Monday for regular appt.

## 2012-08-19 NOTE — Patient Instructions (Signed)
Call for any increased swelling to tenderness in calves.

## 2012-08-22 ENCOUNTER — Other Ambulatory Visit (HOSPITAL_BASED_OUTPATIENT_CLINIC_OR_DEPARTMENT_OTHER): Payer: BC Managed Care – PPO

## 2012-08-22 ENCOUNTER — Ambulatory Visit (HOSPITAL_BASED_OUTPATIENT_CLINIC_OR_DEPARTMENT_OTHER): Payer: BC Managed Care – PPO | Admitting: Pharmacist

## 2012-08-22 ENCOUNTER — Ambulatory Visit: Payer: BC Managed Care – PPO

## 2012-08-22 ENCOUNTER — Other Ambulatory Visit: Payer: Self-pay | Admitting: Certified Registered Nurse Anesthetist

## 2012-08-22 ENCOUNTER — Ambulatory Visit: Payer: BC Managed Care – PPO | Admitting: Oncology

## 2012-08-22 DIAGNOSIS — I82409 Acute embolism and thrombosis of unspecified deep veins of unspecified lower extremity: Secondary | ICD-10-CM

## 2012-08-22 LAB — PROTIME-INR
INR: 4.7 — ABNORMAL HIGH (ref 2.00–3.50)
Protime: 56.4 Seconds — ABNORMAL HIGH (ref 10.6–13.4)

## 2012-08-22 LAB — POCT INR: INR: 4.7

## 2012-08-22 NOTE — Patient Instructions (Addendum)
Hold coumadin on Tue and Wed and begin Coumadin 7.5 mg daily.  Will check PT/INR 08/31/12 during your chemo infusion that begins at 11:45.

## 2012-08-22 NOTE — Progress Notes (Signed)
Pt supratherapeutic today at 4.7.  He started his FOLFIRI/Vectibex regimen on April 2.  He is very fatigued and has little appetite. I encouraged him to eat whenever he has an appetite for something and not wait until mealtime. He already took todays dose of coumadin.  He will hold for two days and resume at 7.5 mg daily. I gave him #10 samples of the 7.5 mg strength. We will see him in infusion next week.

## 2012-08-24 ENCOUNTER — Telehealth: Payer: Self-pay | Admitting: Dietician

## 2012-08-24 NOTE — Telephone Encounter (Signed)
Brief Outpatient Oncology Nutrition Note  Patient has been identified to be at risk on malnutrition screen.   Wt Readings from Last 10 Encounters:  08/15/12 164 lb 3.2 oz (74.481 kg)  08/15/12 164 lb 3.2 oz (74.481 kg)  08/08/12 166 lb 9.6 oz (75.569 kg)  08/03/12 168 lb 8 oz (76.431 kg)  08/02/12 168 lb (76.204 kg)  07/29/12 170 lb 4.8 oz (77.248 kg)  07/11/12 171 lb 8 oz (77.792 kg)  06/03/12 176 lb 1.6 oz (79.878 kg)  05/02/12 169 lb 3.2 oz (76.749 kg)  04/26/12 169 lb 11.2 oz (76.975 kg)   Called and spoke with patient who states that appetite is improving and that he is starting to eat better.  Did not eat well from Wednesday last week to Monday of this week.  Drinks a protein shake as needed.  Reports constipation is improved with increasing fluids and reducing pain meds.

## 2012-08-25 ENCOUNTER — Encounter: Payer: Self-pay | Admitting: Gastroenterology

## 2012-08-28 ENCOUNTER — Other Ambulatory Visit: Payer: Self-pay | Admitting: Oncology

## 2012-08-31 ENCOUNTER — Other Ambulatory Visit (HOSPITAL_BASED_OUTPATIENT_CLINIC_OR_DEPARTMENT_OTHER): Payer: BC Managed Care – PPO

## 2012-08-31 ENCOUNTER — Ambulatory Visit (HOSPITAL_BASED_OUTPATIENT_CLINIC_OR_DEPARTMENT_OTHER): Payer: BC Managed Care – PPO

## 2012-08-31 ENCOUNTER — Ambulatory Visit (HOSPITAL_BASED_OUTPATIENT_CLINIC_OR_DEPARTMENT_OTHER): Payer: BC Managed Care – PPO | Admitting: Oncology

## 2012-08-31 ENCOUNTER — Other Ambulatory Visit: Payer: Self-pay | Admitting: *Deleted

## 2012-08-31 ENCOUNTER — Ambulatory Visit (HOSPITAL_BASED_OUTPATIENT_CLINIC_OR_DEPARTMENT_OTHER): Payer: BC Managed Care – PPO | Admitting: Pharmacist

## 2012-08-31 VITALS — BP 147/83 | HR 67 | Temp 97.7°F | Resp 20 | Ht 69.0 in | Wt 158.2 lb

## 2012-08-31 DIAGNOSIS — C189 Malignant neoplasm of colon, unspecified: Secondary | ICD-10-CM

## 2012-08-31 DIAGNOSIS — C184 Malignant neoplasm of transverse colon: Secondary | ICD-10-CM

## 2012-08-31 DIAGNOSIS — M549 Dorsalgia, unspecified: Secondary | ICD-10-CM

## 2012-08-31 DIAGNOSIS — L270 Generalized skin eruption due to drugs and medicaments taken internally: Secondary | ICD-10-CM

## 2012-08-31 DIAGNOSIS — C772 Secondary and unspecified malignant neoplasm of intra-abdominal lymph nodes: Secondary | ICD-10-CM

## 2012-08-31 DIAGNOSIS — Z5112 Encounter for antineoplastic immunotherapy: Secondary | ICD-10-CM

## 2012-08-31 DIAGNOSIS — C182 Malignant neoplasm of ascending colon: Secondary | ICD-10-CM

## 2012-08-31 DIAGNOSIS — Z7901 Long term (current) use of anticoagulants: Secondary | ICD-10-CM

## 2012-08-31 DIAGNOSIS — Z5111 Encounter for antineoplastic chemotherapy: Secondary | ICD-10-CM

## 2012-08-31 DIAGNOSIS — I82402 Acute embolism and thrombosis of unspecified deep veins of left lower extremity: Secondary | ICD-10-CM

## 2012-08-31 DIAGNOSIS — I82409 Acute embolism and thrombosis of unspecified deep veins of unspecified lower extremity: Secondary | ICD-10-CM

## 2012-08-31 LAB — CBC WITH DIFFERENTIAL/PLATELET
BASO%: 0.6 % (ref 0.0–2.0)
LYMPH%: 11.4 % — ABNORMAL LOW (ref 14.0–49.0)
MCH: 29.9 pg (ref 27.2–33.4)
MCHC: 34.6 g/dL (ref 32.0–36.0)
MCV: 86.4 fL (ref 79.3–98.0)
MONO%: 7.5 % (ref 0.0–14.0)
Platelets: 175 10*3/uL (ref 140–400)
RBC: 4.12 10*6/uL — ABNORMAL LOW (ref 4.20–5.82)

## 2012-08-31 LAB — COMPREHENSIVE METABOLIC PANEL (CC13)
ALT: 44 U/L (ref 0–55)
Alkaline Phosphatase: 124 U/L (ref 40–150)
Sodium: 139 mEq/L (ref 136–145)
Total Bilirubin: 0.31 mg/dL (ref 0.20–1.20)
Total Protein: 6.9 g/dL (ref 6.4–8.3)

## 2012-08-31 LAB — POCT INR: INR: 3.4

## 2012-08-31 LAB — PROTIME-INR: Protime: 40.8 Seconds — ABNORMAL HIGH (ref 10.6–13.4)

## 2012-08-31 MED ORDER — ATROPINE SULFATE 1 MG/ML IJ SOLN
0.5000 mg | Freq: Once | INTRAMUSCULAR | Status: AC | PRN
  Administered 2012-08-31: 0.5 mg via INTRAVENOUS

## 2012-08-31 MED ORDER — SODIUM CHLORIDE 0.9 % IV SOLN
2400.0000 mg/m2 | INTRAVENOUS | Status: DC
  Administered 2012-08-31: 4600 mg via INTRAVENOUS
  Filled 2012-08-31: qty 92

## 2012-08-31 MED ORDER — DEXAMETHASONE SODIUM PHOSPHATE 4 MG/ML IJ SOLN
20.0000 mg | Freq: Once | INTRAMUSCULAR | Status: AC
  Administered 2012-08-31: 20 mg via INTRAVENOUS

## 2012-08-31 MED ORDER — ONDANSETRON 16 MG/50ML IVPB (CHCC)
16.0000 mg | Freq: Once | INTRAVENOUS | Status: AC
  Administered 2012-08-31: 16 mg via INTRAVENOUS

## 2012-08-31 MED ORDER — SODIUM CHLORIDE 0.9 % IV SOLN
Freq: Once | INTRAVENOUS | Status: AC
  Administered 2012-08-31: 12:00:00 via INTRAVENOUS

## 2012-08-31 MED ORDER — HYDROMORPHONE HCL 4 MG PO TABS: 4.0000 mg | ORAL_TABLET | Freq: Four times a day (QID) | ORAL | Status: DC | PRN

## 2012-08-31 MED ORDER — FLUOROURACIL CHEMO INJECTION 2.5 GM/50ML
400.0000 mg/m2 | Freq: Once | INTRAVENOUS | Status: AC
  Administered 2012-08-31: 750 mg via INTRAVENOUS
  Filled 2012-08-31: qty 15

## 2012-08-31 MED ORDER — SODIUM CHLORIDE 0.9 % IV SOLN
6.0000 mg/kg | Freq: Once | INTRAVENOUS | Status: AC
  Administered 2012-08-31: 460 mg via INTRAVENOUS
  Filled 2012-08-31: qty 23

## 2012-08-31 MED ORDER — LEUCOVORIN CALCIUM INJECTION 350 MG
760.0000 mg | Freq: Once | INTRAVENOUS | Status: AC
  Administered 2012-08-31: 760 mg via INTRAVENOUS
  Filled 2012-08-31: qty 38

## 2012-08-31 MED ORDER — IRINOTECAN HCL CHEMO INJECTION 100 MG/5ML
340.0000 mg | Freq: Once | INTRAVENOUS | Status: AC
  Administered 2012-08-31: 340 mg via INTRAVENOUS
  Filled 2012-08-31: qty 17

## 2012-08-31 NOTE — Progress Notes (Signed)
INR slightly above goal today. No problems to report regarding anticoagulation. Appetite still decreased. Eating less. Pt does not feel like eating much at all after chemo. Pt is scheduled for treatment with vectibix/Folfiri today. Decreased appetite and 5FU treatment likely to further increase INR over the next week. Pt took coumadin 7.5mg  this morning. Begin Coumadin 6 mg daily on 09/01/12.   Will recheck PT/INR on 09/09/12; lab at 8am and coumadin clinic at 8:15am.   Samples provided: Coumadin 6mg  tablets (x 10 tablets).

## 2012-08-31 NOTE — Patient Instructions (Addendum)
Begin Coumadin 6 mg daily on 09/01/12 since you took coumadin 7.5mg  this morning.   Will recheck PT/INR on 09/09/12; lab at 8am and coumadin clinic at 8:15am.

## 2012-08-31 NOTE — Patient Instructions (Signed)
Patient aware of next appointment; discharged home with no complaints. 

## 2012-08-31 NOTE — Telephone Encounter (Signed)
gv and printed appt sched and avs for pt...emailed michelle to add tx.....pt aware....   °

## 2012-08-31 NOTE — Progress Notes (Signed)
[facility]    OFFICE PROGRESS NOTE   INTERVAL HISTORY:   Terry Vargas completed a first cycle of FOLFIRI/panitumumab on 08/17/2012. Terry Vargas developed a burning anterior chest discomfort and bradycardia during the irinotecan infusion. These symptoms resolved when the irinotecan. And Terry Vargas was given atropine. Terry Vargas reports diarrhea on the day of and the day following chemotherapy. The diarrhea resolved with Imodium. No mouth sores or nausea/vomiting. Has developed a rash over the face and trunk. The rash is "dry ".  The low back pain has resolved. Terry Vargas now has a different type of pain at the mid upper back. Terry Vargas says this pain feels like Terry Vargas has strained a muscle.Terry Vargas takes 2 Dilaudid tablets in the evening.  Objective:  Vital signs in last 24 hours:  Blood pressure 147/83, pulse 67, temperature 97.7 F (36.5 C), temperature source Oral, resp. rate 20, height 5\' 9"  (1.753 m), weight 158 lb 3.2 oz (71.759 kg), SpO2 96.00%.    HEENT: no thrush or ulcers Resp: lungs clear bilaterally Cardio: regular rate and rhythm GI: no hepatomegaly, nontender Vascular: no leg edema  Skin:diffuse acne type rash over the face, chest, back, and arms    Portacath/PICC-without erythema  Lab Results:  Lab Results  Component Value Date   WBC 4.9 08/31/2012   HGB 12.3* 08/31/2012   HCT 35.6* 08/31/2012   MCV 86.4 08/31/2012   PLT 175 08/31/2012  ANC 3.8   PT/INR-3.4 Medications: I have reviewed the patient's current medications.  Assessment/Plan: 1.Stage III (T4, N2) adenocarcinoma of the right colon, status post a right colectomy 11/25/2010. K-ras wild-type.  Terry Vargas began adjuvant FOLFOX chemotherapy 12/31/2010. Terry Vargas completed cycle 12 of FOLFOX on 06/03/2011.  #2-PET scan 12/29/2010 with hypermetabolic retroperitoneal and anterior abdominal adenopathy, postoperative changes of the right colectomy with a possible seroma in the anterior abdomen. Patchy activity was noted in the parotid glands, potentially  inflammatory in etiology.  -The hypermetabolic retroperitoneal lymph nodes potentially represented metastatic disease. We presented his case at the GI tumor conference and discussed the case with Dr. Dwain Sarna. A decision was made to proceed with "adjuvant" therapy with the plan for a restaging CT at a 3 to four-month interval.  -The restaging CT on 03/23/2011 revealed a decrease in the periaortic lymphadenopathy, no new adenopathy, and no evidence of liver metastases. This suggests the small retroperitoneal lymph nodes may represent metastatic colon cancer versus resolving "inflammatory" nodes.  -Restaging CT 07/13/2011 revealed increased retroperitoneal lymphadenopathy consistent with metastatic disease.  -Restaging PET scan 07/22/2011 confirmed persistent hypermetabolic retroperitoneal lymph nodes. -Restaging CT 11/12/2011 with a slight increase in the size of retroperitoneal/mesenteric lymph nodes, no other evidence of progressive metastatic disease  -Restaging CT 01/27/2012 with a slight increase in the size of a necrotic celiac node, slight decrease in the para-aortic nodes  -Status post palliative radiation to the abdominal/retroperitoneal lymph nodes completed on 04/04/2012  -Restaging CT 05/31/2012 with a slight decrease in the size of abdominal lymph nodes and a stable mass adjacent to the SMA,? New left lung lesion  -Restaging CT 07/29/2012 with new mediastinal/hilar nodes and a new left lingula nodule. New retrocrural and retroperitoneal nodes.  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 A basal cell carcinoma was recently removed from a right finger by Dr. Jesusita Oka  Yetta Barre.  #6-Left leg deep vein thrombosis confirmed on a Doppler  ultrasound 01/28/2011, now maintained on Coumadin.  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 Terry Vargas has persistent mild numbness in the fingertips and toes. The numbness does not interfere with activity.  #8-Mild neutropenia on 03/11/2011, Terry Vargas received Neulasta following cycle #6 of FOLFOX. Terry Vargas also received Neulasta support following cycle 8, cycle 10 and cycle #12.  #9-Thrombocytopenia secondary to chemotherapy-resolved.  #10-Low back pain-most likely related to the necrotic celiac node. Terry Vargas completed palliative radiation on 04/04/2012. The pain resolved. Terry Vargas recently presented with progressive back pain-likely secondary to progression of retrocrural/retroperitoneal adenopathy. The low back pain has improved and Terry Vargas now has pain at the mid upper back #11-Fever/rash on 07/29/2012-? contrast reaction.  #12-Clinical and CT evidence of a fractured Port-A-Cath 07/29/2012. The Port-A-Cath was removed on 08/04/2012. There was a linear laceration in the line. New Port-A-Cath placed on 08/15/2012 #13-skin rash secondary to panitumumab-Terry Vargas will continue minocycline, we will and a steroid cream if the rash progresses  Disposition:  Terry Vargas appears to have tolerated the first cycle of salvage therapy well. 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 Terry Vargas will continue minocycline. We will add steroids if the rash progresses.Terry Vargas will return for an office visit and chemotherapy in 2 weeks.  The Coumadin dose will be adjusted by the pharmacy Coumadin clinic.   Thornton Papas, MD  08/31/2012  2:44 PM

## 2012-08-31 NOTE — Telephone Encounter (Signed)
Per staff message and POF I have scheduled appts.  JMW  

## 2012-08-31 NOTE — Telephone Encounter (Signed)
Dilaudid Script given to pt in treatment area 08/31/12

## 2012-09-01 ENCOUNTER — Other Ambulatory Visit: Payer: Self-pay | Admitting: Certified Registered Nurse Anesthetist

## 2012-09-02 ENCOUNTER — Ambulatory Visit (HOSPITAL_BASED_OUTPATIENT_CLINIC_OR_DEPARTMENT_OTHER): Payer: BC Managed Care – PPO

## 2012-09-02 VITALS — BP 129/76 | HR 55 | Temp 97.4°F

## 2012-09-02 DIAGNOSIS — C189 Malignant neoplasm of colon, unspecified: Secondary | ICD-10-CM

## 2012-09-02 DIAGNOSIS — Z452 Encounter for adjustment and management of vascular access device: Secondary | ICD-10-CM

## 2012-09-02 DIAGNOSIS — C182 Malignant neoplasm of ascending colon: Secondary | ICD-10-CM

## 2012-09-02 MED ORDER — HEPARIN SOD (PORK) LOCK FLUSH 100 UNIT/ML IV SOLN
500.0000 [IU] | Freq: Once | INTRAVENOUS | Status: AC | PRN
  Administered 2012-09-02: 500 [IU]
  Filled 2012-09-02: qty 5

## 2012-09-02 MED ORDER — SODIUM CHLORIDE 0.9 % IJ SOLN
10.0000 mL | INTRAMUSCULAR | Status: DC | PRN
  Administered 2012-09-02: 10 mL
  Filled 2012-09-02: qty 10

## 2012-09-02 NOTE — Patient Instructions (Signed)
Call MD for problems 

## 2012-09-09 ENCOUNTER — Other Ambulatory Visit (HOSPITAL_BASED_OUTPATIENT_CLINIC_OR_DEPARTMENT_OTHER): Payer: BC Managed Care – PPO

## 2012-09-09 ENCOUNTER — Ambulatory Visit: Payer: BC Managed Care – PPO | Admitting: Pharmacist

## 2012-09-09 DIAGNOSIS — I82409 Acute embolism and thrombosis of unspecified deep veins of unspecified lower extremity: Secondary | ICD-10-CM

## 2012-09-09 LAB — PROTIME-INR

## 2012-09-09 NOTE — Progress Notes (Signed)
INR = 2.7 on Coumadin 6 mg daily. Pt has acneform rash from Vectibix.  He is on Minocycline. No complaints re: anticoag. Intake is mainly proteins = meat.  Having 1 shake/week. INR therapeutic.  No change to Coumadin dose. Repeat INR on 09/28/12 same day as tx.   I provided pt w/ additional Coumadin 6 mg samples (#30 tabs). Ebony Hail, Pharm.D., CPP 09/09/2012@8 :25 AM

## 2012-09-14 ENCOUNTER — Telehealth: Payer: Self-pay | Admitting: Oncology

## 2012-09-14 ENCOUNTER — Ambulatory Visit (HOSPITAL_BASED_OUTPATIENT_CLINIC_OR_DEPARTMENT_OTHER): Payer: BC Managed Care – PPO

## 2012-09-14 ENCOUNTER — Ambulatory Visit (HOSPITAL_BASED_OUTPATIENT_CLINIC_OR_DEPARTMENT_OTHER): Payer: BC Managed Care – PPO | Admitting: Oncology

## 2012-09-14 ENCOUNTER — Other Ambulatory Visit (HOSPITAL_BASED_OUTPATIENT_CLINIC_OR_DEPARTMENT_OTHER): Payer: BC Managed Care – PPO

## 2012-09-14 ENCOUNTER — Ambulatory Visit: Payer: Self-pay | Admitting: Pharmacist

## 2012-09-14 VITALS — BP 157/88 | HR 65 | Temp 98.9°F | Resp 18 | Ht 69.0 in | Wt 157.8 lb

## 2012-09-14 DIAGNOSIS — C189 Malignant neoplasm of colon, unspecified: Secondary | ICD-10-CM

## 2012-09-14 DIAGNOSIS — C772 Secondary and unspecified malignant neoplasm of intra-abdominal lymph nodes: Secondary | ICD-10-CM

## 2012-09-14 DIAGNOSIS — I82409 Acute embolism and thrombosis of unspecified deep veins of unspecified lower extremity: Secondary | ICD-10-CM

## 2012-09-14 DIAGNOSIS — Z5111 Encounter for antineoplastic chemotherapy: Secondary | ICD-10-CM

## 2012-09-14 DIAGNOSIS — C182 Malignant neoplasm of ascending colon: Secondary | ICD-10-CM

## 2012-09-14 DIAGNOSIS — Z5112 Encounter for antineoplastic immunotherapy: Secondary | ICD-10-CM

## 2012-09-14 DIAGNOSIS — R21 Rash and other nonspecific skin eruption: Secondary | ICD-10-CM

## 2012-09-14 DIAGNOSIS — C184 Malignant neoplasm of transverse colon: Secondary | ICD-10-CM

## 2012-09-14 DIAGNOSIS — I82402 Acute embolism and thrombosis of unspecified deep veins of left lower extremity: Secondary | ICD-10-CM

## 2012-09-14 LAB — CBC WITH DIFFERENTIAL/PLATELET
Basophils Absolute: 0 10*3/uL (ref 0.0–0.1)
EOS%: 2.6 % (ref 0.0–7.0)
HCT: 36.4 % — ABNORMAL LOW (ref 38.4–49.9)
HGB: 12.4 g/dL — ABNORMAL LOW (ref 13.0–17.1)
MCH: 30.2 pg (ref 27.2–33.4)
MONO#: 0.5 10*3/uL (ref 0.1–0.9)
NEUT#: 3.7 10*3/uL (ref 1.5–6.5)
RDW: 14.2 % (ref 11.0–14.6)
WBC: 4.8 10*3/uL (ref 4.0–10.3)
lymph#: 0.5 10*3/uL — ABNORMAL LOW (ref 0.9–3.3)

## 2012-09-14 LAB — COMPREHENSIVE METABOLIC PANEL (CC13)
ALT: 35 U/L (ref 0–55)
AST: 31 U/L (ref 5–34)
Albumin: 2.9 g/dL — ABNORMAL LOW (ref 3.5–5.0)
BUN: 9.1 mg/dL (ref 7.0–26.0)
CO2: 26 mEq/L (ref 22–29)
Calcium: 8.5 mg/dL (ref 8.4–10.4)
Chloride: 104 mEq/L (ref 98–107)
Potassium: 3.5 mEq/L (ref 3.5–5.1)

## 2012-09-14 LAB — PROTIME-INR: INR: 3.7 — ABNORMAL HIGH (ref 2.00–3.50)

## 2012-09-14 MED ORDER — FLUOROURACIL CHEMO INJECTION 2.5 GM/50ML
400.0000 mg/m2 | Freq: Once | INTRAVENOUS | Status: AC
  Administered 2012-09-14: 750 mg via INTRAVENOUS
  Filled 2012-09-14: qty 15

## 2012-09-14 MED ORDER — SODIUM CHLORIDE 0.9 % IV SOLN
Freq: Once | INTRAVENOUS | Status: AC
  Administered 2012-09-14: 10:00:00 via INTRAVENOUS

## 2012-09-14 MED ORDER — IRINOTECAN HCL CHEMO INJECTION 100 MG/5ML
340.0000 mg | Freq: Once | INTRAVENOUS | Status: AC
  Administered 2012-09-14: 340 mg via INTRAVENOUS
  Filled 2012-09-14: qty 17

## 2012-09-14 MED ORDER — SODIUM CHLORIDE 0.9 % IV SOLN
2400.0000 mg/m2 | INTRAVENOUS | Status: DC
  Administered 2012-09-14: 4600 mg via INTRAVENOUS
  Filled 2012-09-14: qty 92

## 2012-09-14 MED ORDER — ONDANSETRON 16 MG/50ML IVPB (CHCC)
16.0000 mg | Freq: Once | INTRAVENOUS | Status: AC
  Administered 2012-09-14: 16 mg via INTRAVENOUS

## 2012-09-14 MED ORDER — SODIUM CHLORIDE 0.9 % IV SOLN
6.0000 mg/kg | Freq: Once | INTRAVENOUS | Status: AC
  Administered 2012-09-14: 460 mg via INTRAVENOUS
  Filled 2012-09-14: qty 23

## 2012-09-14 MED ORDER — DEXAMETHASONE SODIUM PHOSPHATE 4 MG/ML IJ SOLN
20.0000 mg | Freq: Once | INTRAMUSCULAR | Status: AC
  Administered 2012-09-14: 20 mg via INTRAVENOUS

## 2012-09-14 MED ORDER — LIDOCAINE-PRILOCAINE 2.5-2.5 % EX CREA: TOPICAL_CREAM | CUTANEOUS | Status: DC | PRN

## 2012-09-14 MED ORDER — LEUCOVORIN CALCIUM INJECTION 350 MG
760.0000 mg | Freq: Once | INTRAVENOUS | Status: AC
  Administered 2012-09-14: 760 mg via INTRAVENOUS
  Filled 2012-09-14: qty 38

## 2012-09-14 NOTE — Progress Notes (Signed)
[facility]    OFFICE PROGRESS NOTE   INTERVAL HISTORY:   He completed a second cycle of FOLFIRI/panitumumab 2 weeks ago. No nausea, mouth sores, or diarrhea following chemotherapy. The back pain has completely resolved. He is no longer taking narcotics. The skin rash has improved over the face, but persists over the chest and back.stable neuropathy symptoms  Objective:  Vital signs in last 24 hours:  Blood pressure 157/88, pulse 65, temperature 98.9 F (37.2 C), temperature source Oral, resp. rate 18, height 5\' 9"  (1.753 m), weight 157 lb 12.8 oz (71.578 kg).    HEENT: no thrush or ulcers Resp: lungs clear bilaterally Cardio: regular rate and rhythm GI: no hepatomegaly, nontender Vascular: no leg edema  Skin:acne type rash over the scalp, face, and trunk. The rash appears to be most intense over the anterior chest   Portacath/PICC-without erythema  Lab Results:  Lab Results  Component Value Date   WBC 4.8 09/14/2012   HGB 12.4* 09/14/2012   HCT 36.4* 09/14/2012   MCV 88.6 09/14/2012   PLT 209 09/14/2012  ANC 3.7  PT-INR 3.7  Medications: I have reviewed the patient's current medications.  Assessment/Plan: #1-Stage III (T4, N2) adenocarcinoma of the right colon, status post a right colectomy 11/25/2010. K-ras wild-type.  He began adjuvant FOLFOX chemotherapy 12/31/2010. He completed cycle 12 of FOLFOX on 06/03/2011.  #2-PET scan 12/29/2010 with hypermetabolic retroperitoneal and anterior abdominal adenopathy, postoperative changes of the right colectomy with a possible seroma in the anterior abdomen. Patchy activity was noted in the parotid glands, potentially inflammatory in etiology.  -The hypermetabolic retroperitoneal lymph nodes potentially represented metastatic disease. We presented his case at the GI tumor conference and discussed the case with Dr. Dwain Sarna. A decision was made to proceed with "adjuvant" therapy with the plan for a restaging CT at  a 3 to four-month interval.  -The restaging CT on 03/23/2011 revealed a decrease in the periaortic lymphadenopathy, no new adenopathy, and no evidence of liver metastases. This suggests the small retroperitoneal lymph nodes may represent metastatic colon cancer versus resolving "inflammatory" nodes.  -Restaging CT 07/13/2011 revealed increased retroperitoneal lymphadenopathy consistent with metastatic disease.  -Restaging PET scan 07/22/2011 confirmed persistent hypermetabolic retroperitoneal lymph nodes. -Restaging CT 11/12/2011 with a slight increase in the size of retroperitoneal/mesenteric lymph nodes, no other evidence of progressive metastatic disease  -Restaging CT 01/27/2012 with a slight increase in the size of a necrotic celiac node, slight decrease in the para-aortic nodes  -Status post palliative radiation to the abdominal/retroperitoneal lymph nodes completed on 04/04/2012  -Restaging CT 05/31/2012 with a slight decrease in the size of abdominal lymph nodes and a stable mass adjacent to the SMA,? New left lung lesion  -Restaging CT 07/29/2012 with new mediastinal/hilar nodes and a new left lingula nodule. New retrocrural and retroperitoneal nodes.  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 A basal cell carcinoma was recently removed from a right finger by Dr. Karlyn Agee.  #6-Left leg deep vein thrombosis confirmed on a Doppler ultrasound 01/28/2011, now maintained on Coumadin.  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 He has persistent mild numbness in the fingertips and toes. The numbness does not interfere with activity.  #8-Mild neutropenia on 03/11/2011, he received  Neulasta  following cycle #6 of FOLFOX. He also received Neulasta support following cycle 8, cycle 10 and cycle #12.  #9-Thrombocytopenia secondary to chemotherapy-resolved.  #10-Low back pain-most likely related to the necrotic celiac node. He completed palliative radiation on 04/04/2012. The pain resolved. He recently presented with progressive back pain-likely secondary to progression of retrocrural/retroperitoneal adenopathy. The low back pain has resolved #11-Fever/rash on 07/29/2012-? contrast reaction.  #12-Clinical and CT evidence of a fractured Port-A-Cath 07/29/2012. The Port-A-Cath was removed on 08/04/2012. There was a linear laceration in the line. New Port-A-Cath placed on 08/15/2012  #13-skin rash secondary to panitumumab-he will continue minocycline, we will add a steroid cream if the rash progresses   Disposition:  He has completed 2 cycles of FOLFIRI/panitumumab. The plan is to proceed with cycle 3 today. He will return for an office visit and chemotherapy in 2 weeks.  The PT/INR is supratherapeutic today. The Coumadin dose will be adjusted by the pharmacy Coumadin clinic.     Thornton Papas, MD  09/14/2012  6:41 PM

## 2012-09-14 NOTE — Patient Instructions (Addendum)
You took coumadin today. Hold coumadin tomorrow.  On 09/16/12, resume coumadin at a decreased dose of 5mg  daily. Will recheck PT/INR on 09/22/12; lab at 8:00 am and coumadin clinic at 8:15.

## 2012-09-14 NOTE — Progress Notes (Signed)
Unscheduled anticoag visit. Pt seen in infusion room. He is scheduled for Vectibix/Folfiri today. Hg = 12.4, Pltc = 209 INR above goal today. No changes in diet, medications,etc. No problems to report regarding anticoagulation. Pt continues on Minocycline. 5FU treatment likely to further increase INR.  Pt took coumadin today. Will hold coumadin tomorrow (5/1).  On 09/16/12, resume coumadin at a decreased dose of 5mg  daily. Will recheck PT/INR on 09/22/12; lab at 8:00 am and coumadin clinic at 8:15.  Samples provided: Coumadin 5mg  (x 10 tablets provided).

## 2012-09-14 NOTE — Patient Instructions (Signed)
[facility] Discharge Instructions for Patients Receiving Chemotherapy  Today you received the following chemotherapy agents Irinotecan, Leucovorin, 5FU, and Vectibix.  To help prevent nausea and vomiting after your treatment, we encourage you to take your nausea medication as prescribed.   If you develop nausea and vomiting that is not controlled by your nausea medication, call the clinic. If it is after clinic hours your family physician or the after hours number for the clinic or go to the Emergency Department.   BELOW ARE SYMPTOMS THAT SHOULD BE REPORTED IMMEDIATELY:  *FEVER GREATER THAN 100.5 F  *CHILLS WITH OR WITHOUT FEVER  NAUSEA AND VOMITING THAT IS NOT CONTROLLED WITH YOUR NAUSEA MEDICATION  *UNUSUAL SHORTNESS OF BREATH  *UNUSUAL BRUISING OR BLEEDING  TENDERNESS IN MOUTH AND THROAT WITH OR WITHOUT PRESENCE OF ULCERS  *URINARY PROBLEMS  *BOWEL PROBLEMS  UNUSUAL RASH Items with * indicate a potential emergency and should be followed up as soon as possible.  One of the nurses will contact you 24 hours after your treatment. Please let the nurse know about any problems that you may have experienced. Feel free to call the clinic you have any questions or concerns. The clinic phone number is [phone].

## 2012-09-15 ENCOUNTER — Other Ambulatory Visit: Payer: Self-pay | Admitting: Certified Registered Nurse Anesthetist

## 2012-09-16 ENCOUNTER — Ambulatory Visit (HOSPITAL_BASED_OUTPATIENT_CLINIC_OR_DEPARTMENT_OTHER): Payer: BC Managed Care – PPO

## 2012-09-16 VITALS — BP 124/77 | HR 83 | Temp 97.9°F

## 2012-09-16 DIAGNOSIS — C189 Malignant neoplasm of colon, unspecified: Secondary | ICD-10-CM

## 2012-09-16 DIAGNOSIS — C182 Malignant neoplasm of ascending colon: Secondary | ICD-10-CM

## 2012-09-16 MED ORDER — HEPARIN SOD (PORK) LOCK FLUSH 100 UNIT/ML IV SOLN
500.0000 [IU] | Freq: Once | INTRAVENOUS | Status: AC | PRN
  Administered 2012-09-16: 500 [IU]
  Filled 2012-09-16: qty 5

## 2012-09-16 MED ORDER — SODIUM CHLORIDE 0.9 % IJ SOLN
10.0000 mL | INTRAMUSCULAR | Status: DC | PRN
  Administered 2012-09-16: 10 mL
  Filled 2012-09-16: qty 10

## 2012-09-16 NOTE — Patient Instructions (Signed)
Call MD for problems or concerns 

## 2012-09-23 ENCOUNTER — Other Ambulatory Visit: Payer: BC Managed Care – PPO | Admitting: Lab

## 2012-09-23 ENCOUNTER — Ambulatory Visit (HOSPITAL_BASED_OUTPATIENT_CLINIC_OR_DEPARTMENT_OTHER): Payer: BC Managed Care – PPO | Admitting: Pharmacist

## 2012-09-23 DIAGNOSIS — I82409 Acute embolism and thrombosis of unspecified deep veins of unspecified lower extremity: Secondary | ICD-10-CM

## 2012-09-23 DIAGNOSIS — I82402 Acute embolism and thrombosis of unspecified deep veins of left lower extremity: Secondary | ICD-10-CM

## 2012-09-23 LAB — POCT INR: INR: 2.5

## 2012-09-23 LAB — PROTIME-INR: Protime: 30 Seconds — ABNORMAL HIGH (ref 10.6–13.4)

## 2012-09-23 NOTE — Progress Notes (Signed)
INR within goal today on 5mg  daily. No problems regarding anticoagulation to report. EGFR rash improving. Pt remains on Minocycline and Hydrocortisone cream. Still with minimal appetite. Pt is maintaining his weight. 157 lbs today. Continue coumadin 5mg  daily. Will recheck PT/INR on 09/28/12; lab at 8:45 am, apt with Misty Stanley at 9:15am, treatment at 10:15am and coumadin clinic at 10:30am.   Samples provided: Coumadin 5mg  tablets (x 10 tablets provided)           Lot: [id number], Exp 09/2013

## 2012-09-23 NOTE — Patient Instructions (Addendum)
Continue coumadin 5mg  daily. Will recheck PT/INR on 09/28/12; lab at 8:45 am, apt with Misty Stanley at 9:15am, treatment at 10:15am and coumadin clinic at 10:30am.

## 2012-09-25 ENCOUNTER — Other Ambulatory Visit: Payer: Self-pay | Admitting: Oncology

## 2012-09-28 ENCOUNTER — Ambulatory Visit: Payer: BC Managed Care – PPO

## 2012-09-28 ENCOUNTER — Telehealth: Payer: Self-pay | Admitting: Oncology

## 2012-09-28 ENCOUNTER — Ambulatory Visit (HOSPITAL_BASED_OUTPATIENT_CLINIC_OR_DEPARTMENT_OTHER): Payer: BC Managed Care – PPO

## 2012-09-28 ENCOUNTER — Ambulatory Visit (HOSPITAL_BASED_OUTPATIENT_CLINIC_OR_DEPARTMENT_OTHER): Payer: BC Managed Care – PPO | Admitting: Nurse Practitioner

## 2012-09-28 ENCOUNTER — Other Ambulatory Visit: Payer: BC Managed Care – PPO | Admitting: Lab

## 2012-09-28 ENCOUNTER — Ambulatory Visit: Payer: BC Managed Care – PPO | Admitting: Pharmacist

## 2012-09-28 ENCOUNTER — Other Ambulatory Visit (HOSPITAL_BASED_OUTPATIENT_CLINIC_OR_DEPARTMENT_OTHER): Payer: BC Managed Care – PPO

## 2012-09-28 VITALS — BP 142/87 | HR 69 | Temp 98.4°F | Resp 17 | Ht 69.0 in | Wt 155.3 lb

## 2012-09-28 DIAGNOSIS — C189 Malignant neoplasm of colon, unspecified: Secondary | ICD-10-CM

## 2012-09-28 DIAGNOSIS — I82402 Acute embolism and thrombosis of unspecified deep veins of left lower extremity: Secondary | ICD-10-CM

## 2012-09-28 DIAGNOSIS — C778 Secondary and unspecified malignant neoplasm of lymph nodes of multiple regions: Secondary | ICD-10-CM

## 2012-09-28 DIAGNOSIS — C182 Malignant neoplasm of ascending colon: Secondary | ICD-10-CM

## 2012-09-28 DIAGNOSIS — Z5111 Encounter for antineoplastic chemotherapy: Secondary | ICD-10-CM

## 2012-09-28 DIAGNOSIS — Z5112 Encounter for antineoplastic immunotherapy: Secondary | ICD-10-CM

## 2012-09-28 DIAGNOSIS — I82409 Acute embolism and thrombosis of unspecified deep veins of unspecified lower extremity: Secondary | ICD-10-CM

## 2012-09-28 DIAGNOSIS — R21 Rash and other nonspecific skin eruption: Secondary | ICD-10-CM

## 2012-09-28 LAB — COMPREHENSIVE METABOLIC PANEL (CC13)
AST: 33 U/L (ref 5–34)
Alkaline Phosphatase: 106 U/L (ref 40–150)
BUN: 16.1 mg/dL (ref 7.0–26.0)
Creatinine: 0.9 mg/dL (ref 0.7–1.3)

## 2012-09-28 LAB — CBC WITH DIFFERENTIAL/PLATELET
BASO%: 0.9 % (ref 0.0–2.0)
HCT: 36.7 % — ABNORMAL LOW (ref 38.4–49.9)
MCHC: 34.2 g/dL (ref 32.0–36.0)
MONO#: 0.5 10*3/uL (ref 0.1–0.9)
NEUT%: 74.9 % (ref 39.0–75.0)
RBC: 4.12 10*6/uL — ABNORMAL LOW (ref 4.20–5.82)
RDW: 15.4 % — ABNORMAL HIGH (ref 11.0–14.6)
WBC: 5.7 10*3/uL (ref 4.0–10.3)
lymph#: 0.6 10*3/uL — ABNORMAL LOW (ref 0.9–3.3)

## 2012-09-28 LAB — PROTIME-INR
INR: 2.9 (ref 2.00–3.50)
Protime: 34.8 Seconds — ABNORMAL HIGH (ref 10.6–13.4)

## 2012-09-28 MED ORDER — SODIUM CHLORIDE 0.9 % IV SOLN
6.0000 mg/kg | Freq: Once | INTRAVENOUS | Status: AC
  Administered 2012-09-28: 460 mg via INTRAVENOUS
  Filled 2012-09-28: qty 23

## 2012-09-28 MED ORDER — ATROPINE SULFATE 1 MG/ML IJ SOLN: 0.5000 mg | Freq: Once | INTRAMUSCULAR | Status: DC | PRN

## 2012-09-28 MED ORDER — FLUOROURACIL CHEMO INJECTION 2.5 GM/50ML
400.0000 mg/m2 | Freq: Once | INTRAVENOUS | Status: AC
  Administered 2012-09-28: 750 mg via INTRAVENOUS
  Filled 2012-09-28: qty 15

## 2012-09-28 MED ORDER — DEXAMETHASONE SODIUM PHOSPHATE 20 MG/5ML IJ SOLN
20.0000 mg | Freq: Once | INTRAMUSCULAR | Status: AC
  Administered 2012-09-28: 20 mg via INTRAVENOUS

## 2012-09-28 MED ORDER — LEUCOVORIN CALCIUM INJECTION 350 MG
400.0000 mg/m2 | Freq: Once | INTRAVENOUS | Status: AC
  Administered 2012-09-28: 764 mg via INTRAVENOUS
  Filled 2012-09-28: qty 38.2

## 2012-09-28 MED ORDER — IRINOTECAN HCL CHEMO INJECTION 100 MG/5ML
180.0000 mg/m2 | Freq: Once | INTRAVENOUS | Status: AC
  Administered 2012-09-28: 344 mg via INTRAVENOUS
  Filled 2012-09-28: qty 17.2

## 2012-09-28 MED ORDER — SODIUM CHLORIDE 0.9 % IV SOLN
Freq: Once | INTRAVENOUS | Status: AC
  Administered 2012-09-28: 10:00:00 via INTRAVENOUS

## 2012-09-28 MED ORDER — SODIUM CHLORIDE 0.9 % IV SOLN
2400.0000 mg/m2 | INTRAVENOUS | Status: DC
  Administered 2012-09-28: 4600 mg via INTRAVENOUS
  Filled 2012-09-28: qty 92

## 2012-09-28 MED ORDER — ATROPINE SULFATE 1 MG/ML IJ SOLN
0.5000 mg | Freq: Once | INTRAMUSCULAR | Status: AC
  Administered 2012-09-28: 0.5 mg via INTRAVENOUS

## 2012-09-28 MED ORDER — ONDANSETRON 16 MG/50ML IVPB (CHCC)
16.0000 mg | Freq: Once | INTRAVENOUS | Status: AC
  Administered 2012-09-28: 16 mg via INTRAVENOUS

## 2012-09-28 NOTE — Progress Notes (Signed)
OFFICE PROGRESS NOTE  Interval history:  Terry Vargas returns as scheduled. He completed cycle 3 FOLFIRI/Panitumumab 09/14/2012. He denies nausea/vomiting. No mouth sores. He had a few loose stools. He took some Imodium with good results. The skin rash continues to improve. He notes some itching around the neck. He is applying hydrocortisone cream. He continues to have a good appetite. The low back pain continues to be resolved. He notes intermittent mild pain at the upper back. He relates the upper back pain to recent extensive work in the yard. He is not taking pain medication.   Objective: Blood pressure 142/87, pulse 69, temperature 98.4 F (36.9 C), temperature source Oral, resp. rate 17, height 5\' 9"  (1.753 m), weight 155 lb 4.8 oz (70.444 kg).  No thrush or ulcerations. Lungs clear. Regular cardiac rhythm. Port-A-Cath site is nontender. Abdomen is soft. No hepatomegaly. Extremities are without edema. Acne-type skin rash over the scalp, face and trunk.  Lab Results: Lab Results  Component Value Date   WBC 5.7 09/28/2012   HGB 12.6* 09/28/2012   HCT 36.7* 09/28/2012   MCV 89.3 09/28/2012   PLT 195 09/28/2012    Chemistry:    Chemistry      Component Value Date/Time   NA 139 09/28/2012 0805   NA 136 08/03/2012 0830   K 3.5 09/28/2012 0805   K 4.8 08/03/2012 0830   CL 106 09/28/2012 0805   CL 97 08/03/2012 0830   CO2 24 09/28/2012 0805   CO2 30 08/03/2012 0830   BUN 16.1 09/28/2012 0805   BUN 12 08/03/2012 0830   CREATININE 0.9 09/28/2012 0805   CREATININE 0.84 08/03/2012 0830      Component Value Date/Time   CALCIUM 8.4 09/28/2012 0805   CALCIUM 9.2 08/03/2012 0830   ALKPHOS 106 09/28/2012 0805   ALKPHOS 56 10/16/2011 0745   AST 33 09/28/2012 0805   AST 38* 10/16/2011 0745   ALT 40 09/28/2012 0805   ALT 46 10/16/2011 0745   BILITOT 0.28 09/28/2012 0805   BILITOT 0.4 10/16/2011 0745       Studies/Results: No results found.  Medications: I have reviewed the patient's current  medications.  Assessment/Plan:  #1-Stage III (T4, N2) adenocarcinoma of the right colon, status post a right colectomy 11/25/2010. K-ras wild-type.  He began adjuvant FOLFOX chemotherapy 12/31/2010. He completed cycle 12 of FOLFOX on 06/03/2011.  #2-PET scan 12/29/2010 with hypermetabolic retroperitoneal and anterior abdominal adenopathy, postoperative changes of the right colectomy with a possible seroma in the anterior abdomen. Patchy activity was noted in the parotid glands, potentially inflammatory in etiology.  -The hypermetabolic retroperitoneal lymph nodes potentially represented metastatic disease. We presented his case at the GI tumor conference and discussed the case with Dr. Dwain Sarna. A decision was made to proceed with "adjuvant" therapy with the plan for a restaging CT at a 3 to four-month interval.  -The restaging CT on 03/23/2011 revealed a decrease in the periaortic lymphadenopathy, no new adenopathy, and no evidence of liver metastases. This suggests the small retroperitoneal lymph nodes may represent metastatic colon cancer versus resolving "inflammatory" nodes.  -Restaging CT 07/13/2011 revealed increased retroperitoneal lymphadenopathy consistent with metastatic disease.  -Restaging PET scan 07/22/2011 confirmed persistent hypermetabolic retroperitoneal lymph nodes. -Restaging CT 11/12/2011 with a slight increase in the size of retroperitoneal/mesenteric lymph nodes, no other evidence of progressive metastatic disease  -Restaging CT 01/27/2012 with a slight increase in the size of a necrotic celiac node, slight decrease in the para-aortic nodes  -Status post palliative radiation  to the abdominal/retroperitoneal lymph nodes completed on 04/04/2012  -Restaging CT 05/31/2012 with a slight decrease in the size of abdominal lymph nodes and a stable mass adjacent to the SMA,? New left lung lesion  -Restaging CT 07/29/2012 with new mediastinal/hilar nodes and a new left lingula nodule.  New retrocrural and retroperitoneal nodes.  -initiation of irinotecan/panitumumab on an every 2 week schedule 08/17/2012.  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 A basal cell carcinoma was recently removed from a right finger by Dr. Karlyn Agee.  #6-Left leg deep vein thrombosis confirmed on a Doppler ultrasound 01/28/2011, now maintained on Coumadin.  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 He has persistent mild numbness in the fingertips and toes. The numbness does not interfere with activity.  #8-Mild neutropenia on 03/11/2011, he received Neulasta following cycle #6 of FOLFOX. He also received Neulasta support following cycle 8, cycle 10 and cycle #12.  #9-Thrombocytopenia secondary to chemotherapy-resolved.  #10-Low back pain-most likely related to the necrotic celiac node. He completed palliative radiation on 04/04/2012. The pain resolved. He recently presented with progressive back pain-likely secondary to progression of retrocrural/retroperitoneal adenopathy. The low back pain has resolved  #11-Fever/rash on 07/29/2012-? contrast reaction.  #12-Clinical and CT evidence of a fractured Port-A-Cath 07/29/2012. The Port-A-Cath was removed on 08/04/2012. There was a linear laceration in the line. New Port-A-Cath placed on 08/15/2012  #13-skin rash secondary to panitumumab-he will continue minocycline. The rash is better.  Disposition-Terry Vargas appears stable. He has completed 3 cycles of FOLFIRI/Panitumumab. Plan to proceed with cycle 4 today as scheduled. He will return for a followup visit and cycle 5 in 2 weeks. Restaging CT evaluation is planned after 5 cycles. He will contact the office prior to his next visit with any problems.  Plan reviewed with Dr.  Truett Perna.   Lonna Cobb ANP/GNP-BC

## 2012-09-28 NOTE — Progress Notes (Signed)
INR within goal today. No problems to report regarding anticoagulation. Appetite is still so-so. He eats when he feels like it. He had a double cheeseburger for lunch and dinner one day over the weekend. Pt is taking Vitamin B12 PO. Unknown dose. Continue coumadin 5mg  daily. Will recheck PT/INR on 10/13/12; lab at 8:15 am, apt with Misty Stanley at 8:45am, treatment at 10:30am and coumadin clinic at 10:45am.   Samples provided: Coumadin 5mg  tablets (x 20 tablets)                                 Lot: [id number], Exp: 09/2013

## 2012-09-28 NOTE — Patient Instructions (Signed)
Continue coumadin 5mg  daily. Will recheck PT/INR on 10/13/12; lab at 8:15 am, apt with Misty Stanley at 8:45am, treatment at 10:30am and coumadin clinic at 10:45am.

## 2012-09-28 NOTE — Patient Instructions (Addendum)
[facility] Discharge Instructions for Patients Receiving Chemotherapy  Today you received the following chemotherapy agents vectibix, 5-fu, leucovorin, irinotecan  To help prevent nausea and vomiting after your treatment, we encourage you to take your nausea medication as needed   If you develop nausea and vomiting that is not controlled by your nausea medication, call the clinic. If it is after clinic hours your family physician or the after hours number for the clinic or go to the Emergency Department.   BELOW ARE SYMPTOMS THAT SHOULD BE REPORTED IMMEDIATELY:  *FEVER GREATER THAN 100.5 F  *CHILLS WITH OR WITHOUT FEVER  NAUSEA AND VOMITING THAT IS NOT CONTROLLED WITH YOUR NAUSEA MEDICATION  *UNUSUAL SHORTNESS OF BREATH  *UNUSUAL BRUISING OR BLEEDING  TENDERNESS IN MOUTH AND THROAT WITH OR WITHOUT PRESENCE OF ULCERS  *URINARY PROBLEMS  *BOWEL PROBLEMS  UNUSUAL RASH Items with * indicate a potential emergency and should be followed up as soon as possible.  The clinic phone number is [phone].   I have been informed and understand all the instructions given to me. I know to contact the clinic, my physician, or go to the Emergency Department if any problems should occur. I do not have any questions at this time, but understand that I may call the clinic during office hours   should I have any questions or need assistance in obtaining follow up care.    __________________________________________  _____________  __________ Signature of Patient or Authorized Representative            Date                   Time    __________________________________________ Nurse's Signature

## 2012-09-28 NOTE — Telephone Encounter (Signed)
gv and printed apt sched and avs for pt...MW added tx...pt aware

## 2012-09-30 ENCOUNTER — Ambulatory Visit (HOSPITAL_BASED_OUTPATIENT_CLINIC_OR_DEPARTMENT_OTHER): Payer: BC Managed Care – PPO

## 2012-09-30 VITALS — BP 138/89 | HR 65 | Temp 98.1°F

## 2012-09-30 DIAGNOSIS — C189 Malignant neoplasm of colon, unspecified: Secondary | ICD-10-CM

## 2012-09-30 MED ORDER — SODIUM CHLORIDE 0.9 % IJ SOLN
10.0000 mL | INTRAMUSCULAR | Status: DC | PRN
  Administered 2012-09-30: 10 mL
  Filled 2012-09-30: qty 10

## 2012-09-30 MED ORDER — HEPARIN SOD (PORK) LOCK FLUSH 100 UNIT/ML IV SOLN
500.0000 [IU] | Freq: Once | INTRAVENOUS | Status: AC | PRN
  Administered 2012-09-30: 500 [IU]
  Filled 2012-09-30: qty 5

## 2012-10-05 ENCOUNTER — Emergency Department (INDEPENDENT_AMBULATORY_CARE_PROVIDER_SITE_OTHER)
Admission: EM | Admit: 2012-10-05 | Discharge: 2012-10-05 | Disposition: A | Discharge status: Home / Self Care | Payer: BC Managed Care – PPO | Source: Home / Self Care | Attending: Emergency Medicine | Admitting: Emergency Medicine

## 2012-10-05 ENCOUNTER — Encounter (HOSPITAL_COMMUNITY): Payer: Self-pay | Admitting: *Deleted

## 2012-10-05 ENCOUNTER — Other Ambulatory Visit: Payer: BC Managed Care – PPO | Admitting: Lab

## 2012-10-05 ENCOUNTER — Ambulatory Visit: Payer: BC Managed Care – PPO | Admitting: Nurse Practitioner

## 2012-10-05 ENCOUNTER — Telehealth: Payer: Self-pay | Admitting: *Deleted

## 2012-10-05 DIAGNOSIS — H60391 Other infective otitis externa, right ear: Secondary | ICD-10-CM

## 2012-10-05 DIAGNOSIS — H60399 Other infective otitis externa, unspecified ear: Secondary | ICD-10-CM

## 2012-10-05 MED ORDER — CIPROFLOXACIN-DEXAMETHASONE 0.3-0.1 % OT SUSP: 4.0000 [drp] | Freq: Two times a day (BID) | OTIC | Status: AC

## 2012-10-05 NOTE — Telephone Encounter (Signed)
2 day history of right ear pain to point of not sleeping. Painful to chew.  Yellow drainage from his ear. No fever. Denies any allergy symptoms. Thinks it needs to be looked at. Confirmed he needs to have NP/MD assess this. This office not able to see him today or tomorrow. Instructed him to call his PCP or go to urgent care walk-in clinic, but he does need to have it checked. He understands and agrees.

## 2012-10-05 NOTE — ED Notes (Signed)
Pt  Reports  Symptoms  Of  r  Earache        With  Sensation of  Fullness        In the r  Ear      As  Well  As  Some  Drainage   Foe r  sev  Days   Pt  Is        A  Chemo       Pt         As  Well  -  He  Is  Awake  And  Alert and  Oriented   And  Is  In no  Distress

## 2012-10-05 NOTE — ED Provider Notes (Signed)
History     CSN: [id number]  Arrival date & time 10/05/12  1425   First MD Initiated Contact with Patient 10/05/12 1605      Chief Complaint  Patient presents with  . Otalgia    (Consider location/radiation/quality/duration/timing/severity/associated sxs/prior treatment) HPI Comments: Patient presents urgent care complaining of ongoing right earache pain and drainage. Patient perceives a sensation of fullness right ear canal and pain exacerbates with minimal ear traction movement. He denies having had any recent sinus congestion respiratory symptoms with the exception of a mild runny nose sniffling. Denies any fevers or significant ear injury. He does however describes that he cleans his ear is with a Q-tip after showers.  Patient is a 52 y.o. male presenting with ear pain. The history is provided by the patient.  Otalgia Location:  Right Behind ear:  No abnormality Quality:  Sharp and pressure Severity:  Moderate Onset quality:  Gradual Duration:  5 days Timing:  Constant Progression:  Worsening Relieved by:  Nothing Worsened by:  Nothing tried Ineffective treatments:  None tried Associated symptoms: ear discharge   Associated symptoms: no congestion, no cough, no fever, no headaches, no neck pain, no rash and no rhinorrhea   Risk factors: no recent travel     Past Medical History  Diagnosis Date  . Abdominal mass, RUQ (right upper quadrant)   . DVT (deep venous thrombosis) 01/28/11    left leg  . History of radiation therapy 03/14/12 - 04/04/12    retroperitoneal/abdominal region  . Colon cancer 12/02/2010    invasive adenocarcinoma, non-polyposis  . Skin cancer     squamous cell-L ear, basal cell- face/chest  . Heart murmur     non symptomatic  told as teenager     Past Surgical History  Procedure Laterality Date  . Mohs surgery  1996    left ear- squamous cell  . Open right colectomy  11/25/10    hemicolectomy w/appendectomy  . Tonsillectomy      as child  .   pot-a-cath insertion    . Skin cancer excision    . Port-a-cath removal Left 08/04/2012    Procedure: REMOVAL PORT-A-CATH;  Surgeon: Emelia Loron, MD;  Location: [facility] OR;  Service: General;  Laterality: Left;  . Portacath placement Left 08/15/2012    Procedure: INSERTION PORT-A-CATH;  Surgeon: Emelia Loron, MD;  Location: [facility];  Service: General;  Laterality: Left;    Family History  Problem Relation Age of Onset  . Colon cancer Paternal Grandmother   . Prostate cancer Paternal Grandfather   . Colon cancer Cousin 35    paternal  . Colon cancer Cousin 30    paternal  . Kidney cancer Father   . Colon cancer Father     77, 62  . Skin cancer Father     squamous cell-forehead, nose  . Cancer Father     kidney, colon    History  Substance Use Topics  . Smoking status: Never Smoker   . Smokeless tobacco: Never Used  . Alcohol Use: Yes     Comment: few beers socially      Review of Systems  Constitutional: Negative for fever, activity change and appetite change.  HENT: Positive for ear pain and ear discharge. Negative for nosebleeds, congestion, rhinorrhea, neck pain, neck stiffness and postnasal drip.   Respiratory: Negative for cough and shortness of breath.   Skin: Negative for color change, rash and wound.  Neurological: Negative for headaches.    Allergies  Review of patient's allergies indicates no known allergies.  Home Medications   Current Outpatient Rx  Name  Route  Sig  Dispense  Refill  . acetaminophen (TYLENOL) 500 MG tablet   Oral   Take 1,000 mg by mouth every 6 (six) hours as needed for pain.          Marland Kitchen aspirin 325 MG tablet   Oral   Take 325 mg by mouth daily.          . ciprofloxacin-dexamethasone (CIPRODEX) otic suspension   Right Ear   Place 4 drops into the right ear 2 (two) times daily.   7.5 mL   0   . Cyanocobalamin (VITAMIN B 12 PO)   Oral   Take by mouth daily.         Marland Kitchen glucosamine-chondroitin  500-400 MG tablet   Oral   Take 2 tablets by mouth daily.          . hydrocortisone cream 1 %   Topical   Apply 1 application topically 2 (two) times daily as needed.         Marland Kitchen HYDROmorphone (DILAUDID) 4 MG tablet   Oral   Take 1-2 tablets (4-8 mg total) by mouth every 6 (six) hours as needed for pain.   30 tablet   0   . KRILL OIL OMEGA-3 PO   Oral   Take 300 mg by mouth daily. Pt uses Mega Red Omega 3 Krill Oil         . lidocaine-prilocaine (EMLA) cream   Topical   Apply topically as needed. Apply to [facility] site one hour prior to injection.   30 g   prn   . minocycline (MINOCIN) 100 MG capsule   Oral   Take 1 capsule (100 mg total) by mouth 2 (two) times daily.   60 capsule   3   . Multiple Vitamin (MULTIVITAMIN) tablet   Oral   Take 1 tablet by mouth daily.         Marland Kitchen oxyCODONE (OXY IR/ROXICODONE) 5 MG immediate release tablet   Oral   Take 1 tablet (5 mg total) by mouth every 4 (four) hours as needed for pain.   60 tablet   0   . warfarin (COUMADIN) 10 MG tablet   Oral   Take 5 mg by mouth daily. Taking samples from coumadin clinic 6mg  tabs           BP 150/106  Pulse 70  Temp(Src) 98.9 F (37.2 C) (Oral)  Resp 16  SpO2 100%  Physical Exam  Nursing note and vitals reviewed. Constitutional: He appears well-developed and well-nourished.  HENT:  Head: Normocephalic and atraumatic.  Right Ear: No lacerations. There is drainage and tenderness. No foreign bodies. No mastoid tenderness. Tympanic membrane is not perforated. No middle ear effusion.  Ears:  Eyes: Conjunctivae are normal.  Neurological: He is alert.  Skin: No rash noted. No erythema.    ED Course  Procedures (including critical care time)  Labs Reviewed - No data to display No results found.   1. Bacterial external ear infection, right       MDM  Exam consistent with a external ear infection. Will start patient on Ciprodex patient was instructed to followup with either [place]  or the ENT Dr. no improvement is noted in the next 48 hours. RX Ciprodex        Jimmie Molly, MD 10/05/12 [phone]

## 2012-10-09 ENCOUNTER — Other Ambulatory Visit: Payer: Self-pay | Admitting: Oncology

## 2012-10-12 ENCOUNTER — Other Ambulatory Visit (HOSPITAL_BASED_OUTPATIENT_CLINIC_OR_DEPARTMENT_OTHER): Payer: BC Managed Care – PPO

## 2012-10-12 ENCOUNTER — Ambulatory Visit (HOSPITAL_BASED_OUTPATIENT_CLINIC_OR_DEPARTMENT_OTHER): Payer: BC Managed Care – PPO

## 2012-10-12 ENCOUNTER — Ambulatory Visit: Payer: BC Managed Care – PPO | Admitting: Pharmacist

## 2012-10-12 ENCOUNTER — Ambulatory Visit (HOSPITAL_BASED_OUTPATIENT_CLINIC_OR_DEPARTMENT_OTHER): Payer: BC Managed Care – PPO | Admitting: Nurse Practitioner

## 2012-10-12 VITALS — BP 131/91 | HR 62 | Temp 97.5°F | Resp 18 | Ht 69.0 in | Wt 156.9 lb

## 2012-10-12 DIAGNOSIS — C778 Secondary and unspecified malignant neoplasm of lymph nodes of multiple regions: Secondary | ICD-10-CM

## 2012-10-12 DIAGNOSIS — I82402 Acute embolism and thrombosis of unspecified deep veins of left lower extremity: Secondary | ICD-10-CM

## 2012-10-12 DIAGNOSIS — C189 Malignant neoplasm of colon, unspecified: Secondary | ICD-10-CM

## 2012-10-12 DIAGNOSIS — C182 Malignant neoplasm of ascending colon: Secondary | ICD-10-CM

## 2012-10-12 DIAGNOSIS — Z7901 Long term (current) use of anticoagulants: Secondary | ICD-10-CM

## 2012-10-12 DIAGNOSIS — Z5111 Encounter for antineoplastic chemotherapy: Secondary | ICD-10-CM

## 2012-10-12 DIAGNOSIS — Z5112 Encounter for antineoplastic immunotherapy: Secondary | ICD-10-CM

## 2012-10-12 DIAGNOSIS — Z86718 Personal history of other venous thrombosis and embolism: Secondary | ICD-10-CM

## 2012-10-12 LAB — COMPREHENSIVE METABOLIC PANEL (CC13)
ALT: 39 U/L (ref 0–55)
Alkaline Phosphatase: 97 U/L (ref 40–150)
CO2: 25 mEq/L (ref 22–29)
Creatinine: 0.8 mg/dL (ref 0.7–1.3)
Glucose: 82 mg/dl (ref 70–99)
Sodium: 140 mEq/L (ref 136–145)
Total Bilirubin: 0.37 mg/dL (ref 0.20–1.20)
Total Protein: 6.5 g/dL (ref 6.4–8.3)

## 2012-10-12 LAB — CBC WITH DIFFERENTIAL/PLATELET
BASO%: 0.8 % (ref 0.0–2.0)
EOS%: 4.9 % (ref 0.0–7.0)
MCH: 30.6 pg (ref 27.2–33.4)
MCHC: 34.1 g/dL (ref 32.0–36.0)
MONO#: 0.5 10*3/uL (ref 0.1–0.9)
RBC: 4.16 10*6/uL — ABNORMAL LOW (ref 4.20–5.82)
RDW: 16.1 % — ABNORMAL HIGH (ref 11.0–14.6)
WBC: 5.3 10*3/uL (ref 4.0–10.3)
lymph#: 0.6 10*3/uL — ABNORMAL LOW (ref 0.9–3.3)

## 2012-10-12 LAB — PROTIME-INR: Protime: 50.4 Seconds — ABNORMAL HIGH (ref 10.6–13.4)

## 2012-10-12 LAB — POCT INR: INR: 4.2

## 2012-10-12 MED ORDER — ATROPINE SULFATE 0.4 MG/ML IJ SOLN
0.5000 mg | Freq: Once | INTRAMUSCULAR | Status: AC | PRN
  Administered 2012-10-12: 0.5 mg via INTRAVENOUS
  Filled 2012-10-12: qty 1.25

## 2012-10-12 MED ORDER — ONDANSETRON 16 MG/50ML IVPB (CHCC)
16.0000 mg | Freq: Once | INTRAVENOUS | Status: AC
  Administered 2012-10-12: 16 mg via INTRAVENOUS

## 2012-10-12 MED ORDER — LEUCOVORIN CALCIUM INJECTION 350 MG
400.0000 mg/m2 | Freq: Once | INTRAVENOUS | Status: AC
  Administered 2012-10-12: 764 mg via INTRAVENOUS
  Filled 2012-10-12: qty 38.2

## 2012-10-12 MED ORDER — SODIUM CHLORIDE 0.9 % IV SOLN
Freq: Once | INTRAVENOUS | Status: AC
  Administered 2012-10-12: 10:00:00 via INTRAVENOUS

## 2012-10-12 MED ORDER — SODIUM CHLORIDE 0.9 % IV SOLN
2400.0000 mg/m2 | INTRAVENOUS | Status: DC
  Administered 2012-10-12: 4600 mg via INTRAVENOUS
  Filled 2012-10-12: qty 92

## 2012-10-12 MED ORDER — DEXAMETHASONE SODIUM PHOSPHATE 20 MG/5ML IJ SOLN
20.0000 mg | Freq: Once | INTRAMUSCULAR | Status: AC
  Administered 2012-10-12: 20 mg via INTRAVENOUS

## 2012-10-12 MED ORDER — IRINOTECAN HCL CHEMO INJECTION 100 MG/5ML
180.0000 mg/m2 | Freq: Once | INTRAVENOUS | Status: AC
  Administered 2012-10-12: 344 mg via INTRAVENOUS
  Filled 2012-10-12: qty 17.2

## 2012-10-12 MED ORDER — FLUOROURACIL CHEMO INJECTION 2.5 GM/50ML
400.0000 mg/m2 | Freq: Once | INTRAVENOUS | Status: AC
  Administered 2012-10-12: 750 mg via INTRAVENOUS
  Filled 2012-10-12: qty 15

## 2012-10-12 MED ORDER — SODIUM CHLORIDE 0.9 % IV SOLN
6.0000 mg/kg | Freq: Once | INTRAVENOUS | Status: AC
  Administered 2012-10-12: 460 mg via INTRAVENOUS
  Filled 2012-10-12: qty 23

## 2012-10-12 NOTE — Progress Notes (Signed)
INR = 4.2 on 5 mg daily Med change: Ciprodex for ear infection ?? Systemic absorption causing INR to be elevated Pt seen by Lonna Cobb, NP today.  She instructed pt to hold his Coumadin doses today & Thurs.   Recheck INR on Friday 5/30- same day as 5-FU pump d/c. Ebony Hail, Pharm.D., CPP 10/12/2012@11 :08 AM

## 2012-10-12 NOTE — Progress Notes (Signed)
OFFICE PROGRESS NOTE  Interval history:  Terry Vargas returns as to scheduled. He completed cycle 4 FOLFIRI/Panitumumab on 09/28/2012. He denies nausea/vomiting. No mouth sores. No diarrhea. The skin rash is unchanged. He intermittently applies hydrocortisone cream around the neck due to itching. Appetite is poor. His weight is stable. He denies back pain.  He is currently completing a course of Ciprodex ear drops for treatment of external otitis.   Objective: Blood pressure 131/91, pulse 62, temperature 97.5 F (36.4 C), temperature source Oral, resp. rate 18, height 5\' 9"  (1.753 m), weight 156 lb 14.4 oz (71.169 kg).  No thrush. Lungs clear. Regular cardiac rhythm. Port-A-Cath site is nontender. Abdomen is soft. No hepatomegaly. Extremities are without edema. Acne-type skin rash over the scalp, face and trunk. Debris/cerumen right ear canal. Left ear canal unremarkable. Left tympanic membrane with mild erythema medially.  Lab Results: Lab Results  Component Value Date   WBC 5.3 10/12/2012   HGB 12.7* 10/12/2012   HCT 37.3* 10/12/2012   MCV 89.7 10/12/2012   PLT 191 10/12/2012    Chemistry:    Chemistry      Component Value Date/Time   NA 140 10/12/2012 0805   NA 136 08/03/2012 0830   K 3.9 10/12/2012 0805   K 4.8 08/03/2012 0830   CL 105 10/12/2012 0805   CL 97 08/03/2012 0830   CO2 25 10/12/2012 0805   CO2 30 08/03/2012 0830   BUN 15.4 10/12/2012 0805   BUN 12 08/03/2012 0830   CREATININE 0.8 10/12/2012 0805   CREATININE 0.84 08/03/2012 0830      Component Value Date/Time   CALCIUM 8.4 10/12/2012 0805   CALCIUM 9.2 08/03/2012 0830   ALKPHOS 97 10/12/2012 0805   ALKPHOS 56 10/16/2011 0745   AST 33 10/12/2012 0805   AST 38* 10/16/2011 0745   ALT 39 10/12/2012 0805   ALT 46 10/16/2011 0745   BILITOT 0.37 10/12/2012 0805   BILITOT 0.4 10/16/2011 0745       Studies/Results: No results found.  Medications: I have reviewed the patient's current medications.  Assessment/Plan:  #1-Stage  III (T4, N2) adenocarcinoma of the right colon, status post a right colectomy 11/25/2010. K-ras wild-type.  He began adjuvant FOLFOX chemotherapy 12/31/2010. He completed cycle 12 of FOLFOX on 06/03/2011.  #2-PET scan 12/29/2010 with hypermetabolic retroperitoneal and anterior abdominal adenopathy, postoperative changes of the right colectomy with a possible seroma in the anterior abdomen. Patchy activity was noted in the parotid glands, potentially inflammatory in etiology.  -The hypermetabolic retroperitoneal lymph nodes potentially represented metastatic disease. We presented his case at the GI tumor conference and discussed the case with Dr. Dwain Sarna. A decision was made to proceed with "adjuvant" therapy with the plan for a restaging CT at a 3 to four-month interval.  -The restaging CT on 03/23/2011 revealed a decrease in the periaortic lymphadenopathy, no new adenopathy, and no evidence of liver metastases. This suggests the small retroperitoneal lymph nodes may represent metastatic colon cancer versus resolving "inflammatory" nodes.  -Restaging CT 07/13/2011 revealed increased retroperitoneal lymphadenopathy consistent with metastatic disease.  -Restaging PET scan 07/22/2011 confirmed persistent hypermetabolic retroperitoneal lymph nodes. -Restaging CT 11/12/2011 with a slight increase in the size of retroperitoneal/mesenteric lymph nodes, no other evidence of progressive metastatic disease  -Restaging CT 01/27/2012 with a slight increase in the size of a necrotic celiac node, slight decrease in the para-aortic nodes  -Status post palliative radiation to the abdominal/retroperitoneal lymph nodes completed on 04/04/2012  -Restaging CT 05/31/2012 with a  slight decrease in the size of abdominal lymph nodes and a stable mass adjacent to the SMA,? New left lung lesion  -Restaging CT 07/29/2012 with new mediastinal/hilar nodes and a new left lingula nodule. New retrocrural and retroperitoneal nodes.   -initiation of irinotecan/panitumumab on an every 2 week schedule 08/17/2012.  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 A basal cell carcinoma was recently removed from a right finger by Dr. Karlyn Agee.  #6-Left leg deep vein thrombosis confirmed on a Doppler ultrasound 01/28/2011, now maintained on Coumadin.  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 He has persistent mild numbness in the fingertips and toes. The numbness does not interfere with activity.  #8-Mild neutropenia on 03/11/2011, he received Neulasta following cycle #6 of FOLFOX. He also received Neulasta support following cycle 8, cycle 10 and cycle #12.  #9-Thrombocytopenia secondary to chemotherapy-resolved.  #10-Low back pain-most likely related to the necrotic celiac node. He completed palliative radiation on 04/04/2012. The pain resolved. He recently presented with progressive back pain-likely secondary to progression of retrocrural/retroperitoneal adenopathy. The low back pain has resolved.  #11-Fever/rash on 07/29/2012-? contrast reaction.  #12-Clinical and CT evidence of a fractured Port-A-Cath 07/29/2012. The Port-A-Cath was removed on 08/04/2012. There was a linear laceration in the line. New Port-A-Cath placed on 08/15/2012  #13-skin rash secondary to panitumumab-he will continue minocycline. The rash is stable. #14-external otitis. He is completing a course of Ciprodex eardrops. #15-supratherapeutic PT/INR. He will hold Coumadin today and tomorrow. Repeat PT/INR on 10/14/2012.  Disposition-Terry Vargas appears stable. He has completed 4 cycles of FOLFIRI/Panitumumab. Plan to proceed with cycle 5 today as scheduled. Restaging CT evaluation is scheduled on 10/20/2012. He has a followup visit  with Dr. Truett Perna on 10/26/2012. He will contact the office in the interim with any problems.  Plan reviewed with Dr. Truett Perna.   Lonna Cobb ANP/GNP-BC

## 2012-10-12 NOTE — Patient Instructions (Addendum)
[facility] Discharge Instructions for Patients Receiving Chemotherapy  Today you received the following chemotherapy agents Vectibix, Irinotecan, Leucovorin and Adrucil.  To help prevent nausea and vomiting after your treatment, we encourage you to take your nausea medication. Begin taking your nausea medication as often as prescribed for by Dr Truett Perna.    If you develop nausea and vomiting that is not controlled by your nausea medication, call the clinic. If it is after clinic hours your family physician or the after hours number for the clinic or go to the Emergency Department.   BELOW ARE SYMPTOMS THAT SHOULD BE REPORTED IMMEDIATELY:  *FEVER GREATER THAN 100.5 F  *CHILLS WITH OR WITHOUT FEVER  NAUSEA AND VOMITING THAT IS NOT CONTROLLED WITH YOUR NAUSEA MEDICATION  *UNUSUAL SHORTNESS OF BREATH  *UNUSUAL BRUISING OR BLEEDING  TENDERNESS IN MOUTH AND THROAT WITH OR WITHOUT PRESENCE OF ULCERS  *URINARY PROBLEMS  *BOWEL PROBLEMS  UNUSUAL RASH Items with * indicate a potential emergency and should be followed up as soon as possible.  One of the nurses will contact you 24 hours after your treatment. Please let the nurse know about any problems that you may have experienced. Feel free to call the clinic you have any questions or concerns. The clinic phone number is [phone].   I have been informed and understand all the instructions given to me. I know to contact the clinic, my physician, or go to the Emergency Department if any problems should occur. I do not have any questions at this time, but understand that I may call the clinic during office hours   should I have any questions or need assistance in obtaining follow up care.    __________________________________________  _____________  __________ Signature of Patient or Authorized Representative            Date                   Time    __________________________________________ Nurse's  Signature

## 2012-10-14 ENCOUNTER — Ambulatory Visit: Payer: BC Managed Care – PPO

## 2012-10-14 ENCOUNTER — Other Ambulatory Visit (HOSPITAL_BASED_OUTPATIENT_CLINIC_OR_DEPARTMENT_OTHER): Payer: BC Managed Care – PPO | Admitting: Lab

## 2012-10-14 ENCOUNTER — Ambulatory Visit (HOSPITAL_BASED_OUTPATIENT_CLINIC_OR_DEPARTMENT_OTHER): Payer: BC Managed Care – PPO | Admitting: Pharmacist

## 2012-10-14 VITALS — BP 138/84 | HR 59 | Temp 97.3°F | Resp 18

## 2012-10-14 DIAGNOSIS — C189 Malignant neoplasm of colon, unspecified: Secondary | ICD-10-CM

## 2012-10-14 DIAGNOSIS — D126 Benign neoplasm of colon, unspecified: Secondary | ICD-10-CM

## 2012-10-14 DIAGNOSIS — I82402 Acute embolism and thrombosis of unspecified deep veins of left lower extremity: Secondary | ICD-10-CM

## 2012-10-14 DIAGNOSIS — I82509 Chronic embolism and thrombosis of unspecified deep veins of unspecified lower extremity: Secondary | ICD-10-CM

## 2012-10-14 DIAGNOSIS — I82409 Acute embolism and thrombosis of unspecified deep veins of unspecified lower extremity: Secondary | ICD-10-CM

## 2012-10-14 MED ORDER — HEPARIN SOD (PORK) LOCK FLUSH 100 UNIT/ML IV SOLN
500.0000 [IU] | Freq: Once | INTRAVENOUS | Status: AC | PRN
  Administered 2012-10-14: 500 [IU]
  Filled 2012-10-14: qty 5

## 2012-10-14 MED ORDER — SODIUM CHLORIDE 0.9 % IJ SOLN
10.0000 mL | INTRAMUSCULAR | Status: DC | PRN
  Filled 2012-10-14: qty 10

## 2012-10-14 MED ORDER — HEPARIN SOD (PORK) LOCK FLUSH 100 UNIT/ML IV SOLN
500.0000 [IU] | Freq: Once | INTRAVENOUS | Status: DC
  Filled 2012-10-14: qty 5

## 2012-10-14 MED ORDER — SODIUM CHLORIDE 0.9 % IJ SOLN
10.0000 mL | INTRAMUSCULAR | Status: DC | PRN
  Administered 2012-10-14: 10 mL
  Filled 2012-10-14: qty 10

## 2012-10-14 NOTE — Progress Notes (Signed)
INR remains elevated today at 4.6. Patient forgot to hold coumadin on Wednesday and Thursday. Pt reports feeling well besides some fatigue. No bruising or bleeding noted. Patient already took coumadin today. Instructed patient to Hold Coumadin for 2 days (Saturday and Sunday) and decrease dose to 2.5 mg (1/2 tablet) starting Monday. Will recheck PT/INR Thursday after CT scan on 10/20/12 at 8:15 am lab and 830am coumadin

## 2012-10-14 NOTE — Patient Instructions (Addendum)
INR above goal  Hold Coumadin for 2 days (Saturday and Sunday) already took todays dose.  Then take 1/2 tablet (2.5 mg) starting Monday we Will recheck PT/INR Thursday after CT scan at 815 lab and 830 coumadin

## 2012-10-20 ENCOUNTER — Ambulatory Visit (HOSPITAL_BASED_OUTPATIENT_CLINIC_OR_DEPARTMENT_OTHER): Payer: BC Managed Care – PPO | Admitting: Pharmacist

## 2012-10-20 ENCOUNTER — Encounter (HOSPITAL_COMMUNITY): Payer: Self-pay

## 2012-10-20 ENCOUNTER — Ambulatory Visit (HOSPITAL_COMMUNITY)
Admission: RE | Admit: 2012-10-20 | Discharge: 2012-10-20 | Disposition: A | Discharge status: Home / Self Care | Payer: BC Managed Care – PPO | Source: Ambulatory Visit | Attending: Nurse Practitioner | Admitting: Nurse Practitioner

## 2012-10-20 ENCOUNTER — Other Ambulatory Visit: Payer: BC Managed Care – PPO

## 2012-10-20 DIAGNOSIS — C19 Malignant neoplasm of rectosigmoid junction: Secondary | ICD-10-CM | POA: Insufficient documentation

## 2012-10-20 DIAGNOSIS — C189 Malignant neoplasm of colon, unspecified: Secondary | ICD-10-CM

## 2012-10-20 DIAGNOSIS — I82409 Acute embolism and thrombosis of unspecified deep veins of unspecified lower extremity: Secondary | ICD-10-CM

## 2012-10-20 DIAGNOSIS — I82402 Acute embolism and thrombosis of unspecified deep veins of left lower extremity: Secondary | ICD-10-CM

## 2012-10-20 DIAGNOSIS — R911 Solitary pulmonary nodule: Secondary | ICD-10-CM | POA: Insufficient documentation

## 2012-10-20 DIAGNOSIS — R599 Enlarged lymph nodes, unspecified: Secondary | ICD-10-CM | POA: Insufficient documentation

## 2012-10-20 LAB — PROTIME-INR

## 2012-10-20 MED ORDER — IOHEXOL 300 MG/ML  SOLN
100.0000 mL | Freq: Once | INTRAMUSCULAR | Status: AC | PRN
  Administered 2012-10-20: 100 mL via INTRAVENOUS

## 2012-10-20 NOTE — Patient Instructions (Signed)
Take coumadin 4.5mg  today.  Then on 10/21/12, begin coumadin 4mg  daily. Recheck INR in 1 week with next scheduled appointments; lab at 9:30am, Dr. Truett Perna at 10am, treatment at 11am and coumadin clinic at 11:15am.

## 2012-10-20 NOTE — Progress Notes (Addendum)
Pt had CT scan this morning. INR below goal today. Pt took coumadin as instructed at last visit. No problems regarding anticoagulation. No s/s of clotting to report. Pt is still using CiproDex. He completed therapy (10 days) on the left ear but has used it a few days on the right ear because he had the same symptoms.  Right ear is better and symptoms have resolved. Pt continues on Minocycline. No medication changes. Appetite still minimal. Doesn't feel like eating much. Weight and albumin level have been stable. Will increase coumadin to 4mg  daily.  Take coumadin 4.5mg  today. (Pt took 2.5mg  this morning and will take an additional 2mg  this evening to total 4.5mg  today).  Then on 10/21/12, begin coumadin 4mg  daily. Recheck INR in 1 week with next scheduled appointments; lab at 9:30am, Dr. Truett Perna at 10am, treatment at 11am and coumadin clinic at 11:15am. Samples provided: Coumadin 2mg  tablet (x 1 tablet)                                 [id number], Exp: 10/14                                Coumadin 4mg  tablets (x 10 tablets)          [id number], Exp: 10/15

## 2012-10-23 ENCOUNTER — Other Ambulatory Visit: Payer: Self-pay | Admitting: Oncology

## 2012-10-26 ENCOUNTER — Telehealth: Payer: Self-pay | Admitting: Oncology

## 2012-10-26 ENCOUNTER — Telehealth: Payer: Self-pay | Admitting: *Deleted

## 2012-10-26 ENCOUNTER — Ambulatory Visit: Payer: BC Managed Care – PPO | Admitting: Pharmacist

## 2012-10-26 ENCOUNTER — Other Ambulatory Visit (HOSPITAL_BASED_OUTPATIENT_CLINIC_OR_DEPARTMENT_OTHER): Payer: BC Managed Care – PPO

## 2012-10-26 ENCOUNTER — Ambulatory Visit (HOSPITAL_BASED_OUTPATIENT_CLINIC_OR_DEPARTMENT_OTHER): Payer: BC Managed Care – PPO

## 2012-10-26 ENCOUNTER — Ambulatory Visit (HOSPITAL_BASED_OUTPATIENT_CLINIC_OR_DEPARTMENT_OTHER): Payer: BC Managed Care – PPO | Admitting: Oncology

## 2012-10-26 VITALS — BP 133/89 | HR 67 | Temp 97.9°F | Resp 18 | Ht 69.0 in | Wt 159.6 lb

## 2012-10-26 DIAGNOSIS — J984 Other disorders of lung: Secondary | ICD-10-CM

## 2012-10-26 DIAGNOSIS — Z5112 Encounter for antineoplastic immunotherapy: Secondary | ICD-10-CM

## 2012-10-26 DIAGNOSIS — C189 Malignant neoplasm of colon, unspecified: Secondary | ICD-10-CM

## 2012-10-26 DIAGNOSIS — C778 Secondary and unspecified malignant neoplasm of lymph nodes of multiple regions: Secondary | ICD-10-CM

## 2012-10-26 DIAGNOSIS — C182 Malignant neoplasm of ascending colon: Secondary | ICD-10-CM

## 2012-10-26 DIAGNOSIS — I82509 Chronic embolism and thrombosis of unspecified deep veins of unspecified lower extremity: Secondary | ICD-10-CM

## 2012-10-26 DIAGNOSIS — I82409 Acute embolism and thrombosis of unspecified deep veins of unspecified lower extremity: Secondary | ICD-10-CM

## 2012-10-26 DIAGNOSIS — Z5111 Encounter for antineoplastic chemotherapy: Secondary | ICD-10-CM

## 2012-10-26 LAB — CBC WITH DIFFERENTIAL/PLATELET
BASO%: 0.9 % (ref 0.0–2.0)
LYMPH%: 11.7 % — ABNORMAL LOW (ref 14.0–49.0)
MCHC: 35.2 g/dL (ref 32.0–36.0)
MCV: 88.9 fL (ref 79.3–98.0)
MONO#: 0.4 10*3/uL (ref 0.1–0.9)
MONO%: 9 % (ref 0.0–14.0)
Platelets: 152 10*3/uL (ref 140–400)
RBC: 3.89 10*6/uL — ABNORMAL LOW (ref 4.20–5.82)
RDW: 17.2 % — ABNORMAL HIGH (ref 11.0–14.6)
WBC: 4.4 10*3/uL (ref 4.0–10.3)

## 2012-10-26 LAB — COMPREHENSIVE METABOLIC PANEL (CC13)
Alkaline Phosphatase: 99 U/L (ref 40–150)
BUN: 13.5 mg/dL (ref 7.0–26.0)
Glucose: 114 mg/dl — ABNORMAL HIGH (ref 70–99)
Total Bilirubin: 0.21 mg/dL (ref 0.20–1.20)

## 2012-10-26 LAB — PROTIME-INR
INR: 1.9 — ABNORMAL LOW (ref 2.00–3.50)
Protime: 22.8 Seconds — ABNORMAL HIGH (ref 10.6–13.4)

## 2012-10-26 LAB — POCT INR: INR: 1.9

## 2012-10-26 MED ORDER — SODIUM CHLORIDE 0.9 % IV SOLN
2400.0000 mg/m2 | INTRAVENOUS | Status: DC
  Administered 2012-10-26: 4600 mg via INTRAVENOUS
  Filled 2012-10-26: qty 92

## 2012-10-26 MED ORDER — SODIUM CHLORIDE 0.9 % IV SOLN
Freq: Once | INTRAVENOUS | Status: AC
  Administered 2012-10-26: 11:00:00 via INTRAVENOUS

## 2012-10-26 MED ORDER — IRINOTECAN HCL CHEMO INJECTION 100 MG/5ML
180.0000 mg/m2 | Freq: Once | INTRAVENOUS | Status: AC
  Administered 2012-10-26: 344 mg via INTRAVENOUS
  Filled 2012-10-26: qty 17.2

## 2012-10-26 MED ORDER — FLUOROURACIL CHEMO INJECTION 2.5 GM/50ML
400.0000 mg/m2 | Freq: Once | INTRAVENOUS | Status: AC
  Administered 2012-10-26: 750 mg via INTRAVENOUS
  Filled 2012-10-26: qty 15

## 2012-10-26 MED ORDER — LEUCOVORIN CALCIUM INJECTION 350 MG
400.0000 mg/m2 | Freq: Once | INTRAVENOUS | Status: AC
  Administered 2012-10-26: 764 mg via INTRAVENOUS
  Filled 2012-10-26: qty 38.2

## 2012-10-26 MED ORDER — ONDANSETRON 16 MG/50ML IVPB (CHCC)
16.0000 mg | Freq: Once | INTRAVENOUS | Status: AC
  Administered 2012-10-26: 16 mg via INTRAVENOUS

## 2012-10-26 MED ORDER — DEXAMETHASONE SODIUM PHOSPHATE 20 MG/5ML IJ SOLN
20.0000 mg | Freq: Once | INTRAMUSCULAR | Status: AC
  Administered 2012-10-26: 20 mg via INTRAVENOUS

## 2012-10-26 MED ORDER — SODIUM CHLORIDE 0.9 % IV SOLN
6.0000 mg/kg | Freq: Once | INTRAVENOUS | Status: AC
  Administered 2012-10-26: 460 mg via INTRAVENOUS
  Filled 2012-10-26: qty 23

## 2012-10-26 MED ORDER — ATROPINE SULFATE 1 MG/ML IJ SOLN
0.5000 mg | Freq: Once | INTRAMUSCULAR | Status: AC | PRN
  Administered 2012-10-26: 0.5 mg via INTRAVENOUS

## 2012-10-26 NOTE — Telephone Encounter (Signed)
Per staff message and POF I have scheduled appts.  JMW  

## 2012-10-26 NOTE — Patient Instructions (Signed)
Continue coumadin 4mg  daily.  Recheck INR in 2 weeks with next scheduled appointments on 11/09/12; lab at 8:45am, [facility] [facility] [address] at 9:15am, treatment at 10:15am and coumadin clinic at 10:30am.

## 2012-10-26 NOTE — Patient Instructions (Signed)
 Cancer Center Discharge Instructions for Patients Receiving Chemotherapy  Today you received the following chemotherapy agents 5 FU/Leucovorin/Irinotecan/Vectibix To help prevent nausea and vomiting after your treatment, we encourage you to take your nausea medication as prescribed.  If you develop nausea and vomiting that is not controlled by your nausea medication, call the clinic.   BELOW ARE SYMPTOMS THAT SHOULD BE REPORTED IMMEDIATELY:  *FEVER GREATER THAN 100.5 F  *CHILLS WITH OR WITHOUT FEVER  NAUSEA AND VOMITING THAT IS NOT CONTROLLED WITH YOUR NAUSEA MEDICATION  *UNUSUAL SHORTNESS OF BREATH  *UNUSUAL BRUISING OR BLEEDING  TENDERNESS IN MOUTH AND THROAT WITH OR WITHOUT PRESENCE OF ULCERS  *URINARY PROBLEMS  *BOWEL PROBLEMS  UNUSUAL RASH Items with * indicate a potential emergency and should be followed up as soon as possible.  Feel free to call the clinic you have any questions or concerns. The clinic phone number is [phone].

## 2012-10-26 NOTE — Progress Notes (Addendum)
INR near goal today. No problems to report regarding anticoagulation. No s/s of clotting. Pt has been doing more cardio lately. He has been trying to do 30 minutes daily on his indoor bike. Continue coumadin 4mg  daily.  Recheck INR in 2 weeks with next scheduled appointments on 11/09/12; lab at 8:45am, [facility] at 9:15am, treatment at 10:15am and coumadin clinic at 10:30am.   Samples provided: Coumadin 4mg  tablets (x 20 tablets)                                Lot: [id number], Exp: 02/2014

## 2012-10-26 NOTE — Progress Notes (Signed)
[facility]    OFFICE PROGRESS NOTE   INTERVAL HISTORY:   He has completed 5 cycles of FOLFIRI/Avastin. No nausea/vomiting or diarrhea. The skin rash has improved. He continues to have neuropathy symptoms in the hands and feet. No significant back pain. Terry Vargas reports malaise. He continues to work.  Objective:  Vital signs in last 24 hours:  Blood pressure 133/89, pulse 67, temperature 97.9 F (36.6 C), temperature source Oral, resp. rate 18, height 5\' 9"  (1.753 m), weight 159 lb 9.6 oz (72.394 kg).    HEENT: no thrush or ulcer Resp: lungs clear bilaterally Cardio: regular rate and rhythm GI: no hepatomegaly Vascular: trace edema at the left lower leg  Skin:acne type rash over the face and trunk   Portacath/PICC-without erythema  Lab Results:  Lab Results  Component Value Date   WBC 4.4 10/26/2012   HGB 12.2* 10/26/2012   HCT 34.6* 10/26/2012   MCV 88.9 10/26/2012   PLT 152 10/26/2012    X-rays: Restaging CTs of the chest, abdomen, and pelvis on 10/20/2012-compared to 07/29/2012-improved mediastinal Lymphadenopathy and decreased size of lingular pulmonary, no focal hepatic lesion, and enlarged retroperitoneal node adjacent to the spleen measures 24 mm-stable, a small retrocrural node and a left periaortic node havedecreased in size . Medications: I have reviewed the patient's current medications.  Assessment/Plan: #1-Stage III (T4, N2) adenocarcinoma of the right colon, status post a right colectomy 11/25/2010. K-ras wild-type.  He began adjuvant FOLFOX chemotherapy 12/31/2010. He completed cycle 12 of FOLFOX on 06/03/2011.  #2-PET scan 12/29/2010 with hypermetabolic retroperitoneal and anterior abdominal adenopathy, postoperative changes of the right colectomy with a possible seroma in the anterior abdomen. Patchy activity was noted in the parotid glands, potentially inflammatory in etiology.  -The hypermetabolic retroperitoneal lymph nodes potentially  represented metastatic disease. We presented his case at the GI tumor conference and discussed the case with Dr. Dwain Sarna. A decision was made to proceed with "adjuvant" therapy with the plan for a restaging CT at a 3 to four-month interval.  -The restaging CT on 03/23/2011 revealed a decrease in the periaortic lymphadenopathy, no new adenopathy, and no evidence of liver metastases. This suggests the small retroperitoneal lymph nodes may represent metastatic colon cancer versus resolving "inflammatory" nodes.  -Restaging CT 07/13/2011 revealed increased retroperitoneal lymphadenopathy consistent with metastatic disease.  -Restaging PET scan 07/22/2011 confirmed persistent hypermetabolic retroperitoneal lymph nodes. -Restaging CT 11/12/2011 with a slight increase in the size of retroperitoneal/mesenteric lymph nodes, no other evidence of progressive metastatic disease  -Restaging CT 01/27/2012 with a slight increase in the size of a necrotic celiac node, slight decrease in the para-aortic nodes  -Status post palliative radiation to the abdominal/retroperitoneal lymph nodes completed on 04/04/2012  -Restaging CT 05/31/2012 with a slight decrease in the size of abdominal lymph nodes and a stable mass adjacent to the SMA,? New left lung lesion  -Restaging CT 07/29/2012 with new mediastinal/hilar nodes and a new left lingula nodule. New retrocrural and retroperitoneal nodes.  -initiation of FOLFIRI/panitumumab on an every 2 week schedule 08/17/2012.  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 A basal cell carcinoma was recently  removed from a right  finger by Dr. Karlyn Agee.  #6-Left leg deep vein thrombosis confirmed on a Doppler ultrasound 01/28/2011, now maintained on Coumadin.  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 He has persistent mild numbness in the fingertips and toes. The numbness does not interfere with activity.  #8-Mild neutropenia on 03/11/2011, he received Neulasta following cycle #6 of FOLFOX. He also received Neulasta support following cycle 8, cycle 10 and cycle #12.  #9-Thrombocytopenia secondary to chemotherapy-resolved.  #10-Low back pain-most likely related to the necrotic celiac node. He completed palliative radiation on 04/04/2012. The pain resolved. He recently presented with progressive back pain-likely secondary to progression of retrocrural/retroperitoneal adenopathy. The low back pain has resolved.  #11-Fever/rash on 07/29/2012-? contrast reaction.  #12-Clinical and CT evidence of a fractured Port-A-Cath 07/29/2012. The Port-A-Cath was removed on 08/04/2012. There was a linear laceration in the line. New Port-A-Cath placed on 08/15/2012  #13-skin rash secondary to panitumumab-he will continue minocycline. The rash is stable.  #14-external otitis. He completed a course of Ciprodex eardrops.  Disposition: He has completed 5 cycles of FOLFIRI/panitumumab. There has been clinical and radiologic improvement. I discussed the restaging CT results with Terry Vargas and his wife. We decided to continue the current chemotherapy regimen on a 2 week schedule. We will plan for another restaging evaluation after 5 or 6 more cycles.  He will return for an office visit and chemotherapy in 2 weeks.   Thornton Papas, MD  10/26/2012  7:43 PM

## 2012-10-28 ENCOUNTER — Ambulatory Visit (HOSPITAL_BASED_OUTPATIENT_CLINIC_OR_DEPARTMENT_OTHER): Payer: BC Managed Care – PPO

## 2012-10-28 VITALS — BP 137/82 | HR 73 | Temp 98.9°F | Resp 18

## 2012-10-28 DIAGNOSIS — C189 Malignant neoplasm of colon, unspecified: Secondary | ICD-10-CM

## 2012-10-28 MED ORDER — HEPARIN SOD (PORK) LOCK FLUSH 100 UNIT/ML IV SOLN
500.0000 [IU] | Freq: Once | INTRAVENOUS | Status: AC | PRN
  Administered 2012-10-28: 500 [IU]
  Filled 2012-10-28: qty 5

## 2012-10-28 MED ORDER — SODIUM CHLORIDE 0.9 % IJ SOLN
10.0000 mL | INTRAMUSCULAR | Status: DC | PRN
  Administered 2012-10-28: 10 mL
  Filled 2012-10-28: qty 10

## 2012-10-28 NOTE — Patient Instructions (Signed)
Call MD for problems or concerns 

## 2012-11-06 ENCOUNTER — Other Ambulatory Visit: Payer: Self-pay | Admitting: Oncology

## 2012-11-09 ENCOUNTER — Ambulatory Visit (HOSPITAL_BASED_OUTPATIENT_CLINIC_OR_DEPARTMENT_OTHER): Payer: BC Managed Care – PPO | Admitting: Pharmacist

## 2012-11-09 ENCOUNTER — Ambulatory Visit (HOSPITAL_BASED_OUTPATIENT_CLINIC_OR_DEPARTMENT_OTHER): Payer: BC Managed Care – PPO

## 2012-11-09 ENCOUNTER — Telehealth: Payer: Self-pay | Admitting: *Deleted

## 2012-11-09 ENCOUNTER — Ambulatory Visit (HOSPITAL_BASED_OUTPATIENT_CLINIC_OR_DEPARTMENT_OTHER): Payer: BC Managed Care – PPO | Admitting: Nurse Practitioner

## 2012-11-09 ENCOUNTER — Other Ambulatory Visit (HOSPITAL_BASED_OUTPATIENT_CLINIC_OR_DEPARTMENT_OTHER): Payer: BC Managed Care – PPO

## 2012-11-09 VITALS — BP 130/90 | HR 62 | Temp 97.3°F | Resp 18 | Ht 69.0 in | Wt 158.3 lb

## 2012-11-09 DIAGNOSIS — C189 Malignant neoplasm of colon, unspecified: Secondary | ICD-10-CM

## 2012-11-09 DIAGNOSIS — C778 Secondary and unspecified malignant neoplasm of lymph nodes of multiple regions: Secondary | ICD-10-CM

## 2012-11-09 DIAGNOSIS — C182 Malignant neoplasm of ascending colon: Secondary | ICD-10-CM

## 2012-11-09 DIAGNOSIS — I82509 Chronic embolism and thrombosis of unspecified deep veins of unspecified lower extremity: Secondary | ICD-10-CM

## 2012-11-09 DIAGNOSIS — Z5111 Encounter for antineoplastic chemotherapy: Secondary | ICD-10-CM

## 2012-11-09 DIAGNOSIS — D696 Thrombocytopenia, unspecified: Secondary | ICD-10-CM

## 2012-11-09 DIAGNOSIS — I82409 Acute embolism and thrombosis of unspecified deep veins of unspecified lower extremity: Secondary | ICD-10-CM

## 2012-11-09 DIAGNOSIS — Z5112 Encounter for antineoplastic immunotherapy: Secondary | ICD-10-CM

## 2012-11-09 LAB — COMPREHENSIVE METABOLIC PANEL (CC13)
Albumin: 3.1 g/dL — ABNORMAL LOW (ref 3.5–5.0)
BUN: 15.4 mg/dL (ref 7.0–26.0)
CO2: 24 mEq/L (ref 22–29)
Calcium: 9 mg/dL (ref 8.4–10.4)
Chloride: 108 mEq/L — ABNORMAL HIGH (ref 98–107)
Creatinine: 0.8 mg/dL (ref 0.7–1.3)
Glucose: 105 mg/dl — ABNORMAL HIGH (ref 70–99)
Potassium: 3.5 mEq/L (ref 3.5–5.1)

## 2012-11-09 LAB — MAGNESIUM (CC13): Magnesium: 1.9 mg/dl (ref 1.5–2.5)

## 2012-11-09 LAB — CBC WITH DIFFERENTIAL/PLATELET
BASO%: 0.6 % (ref 0.0–2.0)
EOS%: 3.7 % (ref 0.0–7.0)
MCH: 30.5 pg (ref 27.2–33.4)
MCHC: 34.7 g/dL (ref 32.0–36.0)
MCV: 87.9 fL (ref 79.3–98.0)
MONO%: 7.1 % (ref 0.0–14.0)
NEUT%: 79.3 % — ABNORMAL HIGH (ref 39.0–75.0)
RDW: 15.9 % — ABNORMAL HIGH (ref 11.0–14.6)
lymph#: 0.5 10*3/uL — ABNORMAL LOW (ref 0.9–3.3)

## 2012-11-09 MED ORDER — ONDANSETRON 16 MG/50ML IVPB (CHCC)
16.0000 mg | Freq: Once | INTRAVENOUS | Status: AC
  Administered 2012-11-09: 16 mg via INTRAVENOUS

## 2012-11-09 MED ORDER — FLUOROURACIL CHEMO INJECTION 2.5 GM/50ML
400.0000 mg/m2 | Freq: Once | INTRAVENOUS | Status: AC
  Administered 2012-11-09: 750 mg via INTRAVENOUS
  Filled 2012-11-09: qty 15

## 2012-11-09 MED ORDER — IRINOTECAN HCL CHEMO INJECTION 100 MG/5ML
180.0000 mg/m2 | Freq: Once | INTRAVENOUS | Status: AC
  Administered 2012-11-09: 344 mg via INTRAVENOUS
  Filled 2012-11-09: qty 17.2

## 2012-11-09 MED ORDER — ATROPINE SULFATE 0.4 MG/ML IJ SOLN
0.5000 mg | Freq: Once | INTRAMUSCULAR | Status: AC | PRN
  Administered 2012-11-09: 0.5 mg via INTRAVENOUS
  Filled 2012-11-09: qty 1.25

## 2012-11-09 MED ORDER — HEPARIN SOD (PORK) LOCK FLUSH 100 UNIT/ML IV SOLN
500.0000 [IU] | Freq: Once | INTRAVENOUS | Status: DC | PRN
  Filled 2012-11-09: qty 5

## 2012-11-09 MED ORDER — DEXAMETHASONE SODIUM PHOSPHATE 20 MG/5ML IJ SOLN
20.0000 mg | Freq: Once | INTRAMUSCULAR | Status: AC
  Administered 2012-11-09: 20 mg via INTRAVENOUS

## 2012-11-09 MED ORDER — SODIUM CHLORIDE 0.9 % IJ SOLN
10.0000 mL | INTRAMUSCULAR | Status: DC | PRN
  Filled 2012-11-09: qty 10

## 2012-11-09 MED ORDER — SODIUM CHLORIDE 0.9 % IV SOLN
6.0000 mg/kg | Freq: Once | INTRAVENOUS | Status: AC
  Administered 2012-11-09: 460 mg via INTRAVENOUS
  Filled 2012-11-09: qty 23

## 2012-11-09 MED ORDER — SODIUM CHLORIDE 0.9 % IV SOLN
Freq: Once | INTRAVENOUS | Status: AC
  Administered 2012-11-09: 11:00:00 via INTRAVENOUS

## 2012-11-09 MED ORDER — ATROPINE SULFATE 1 MG/ML IJ SOLN: 0.5000 mg | Freq: Once | INTRAMUSCULAR | Status: DC | PRN

## 2012-11-09 MED ORDER — LEUCOVORIN CALCIUM INJECTION 350 MG
400.0000 mg/m2 | Freq: Once | INTRAVENOUS | Status: AC
  Administered 2012-11-09: 764 mg via INTRAVENOUS
  Filled 2012-11-09: qty 38.2

## 2012-11-09 MED ORDER — SODIUM CHLORIDE 0.9 % IV SOLN
2400.0000 mg/m2 | INTRAVENOUS | Status: DC
  Administered 2012-11-09: 4600 mg via INTRAVENOUS
  Filled 2012-11-09: qty 92

## 2012-11-09 NOTE — Patient Instructions (Addendum)
[facility] Discharge Instructions for Patients Receiving Chemotherapy  Today you received the following chemotherapy agents 5 FU/Leucovorin/Irinotecan/Vectibix To help prevent nausea and vomiting after your treatment, we encourage you to take your nausea medication as prescribed.  If you develop nausea and vomiting that is not controlled by your nausea medication, call the clinic.   BELOW ARE SYMPTOMS THAT SHOULD BE REPORTED IMMEDIATELY:  *FEVER GREATER THAN 100.5 F  *CHILLS WITH OR WITHOUT FEVER  NAUSEA AND VOMITING THAT IS NOT CONTROLLED WITH YOUR NAUSEA MEDICATION  *UNUSUAL SHORTNESS OF BREATH  *UNUSUAL BRUISING OR BLEEDING  TENDERNESS IN MOUTH AND THROAT WITH OR WITHOUT PRESENCE OF ULCERS  *URINARY PROBLEMS  *BOWEL PROBLEMS  UNUSUAL RASH Items with * indicate a potential emergency and should be followed up as soon as possible.  Feel free to call the clinic you have any questions or concerns. The clinic phone number is [phone].

## 2012-11-09 NOTE — Progress Notes (Signed)
OFFICE PROGRESS NOTE  Interval history:  [name] returns as scheduled. He completed cycle 6 FOLFIRI/Panitumumab on 10/26/2012. He denies nausea/vomiting. No mouth sores. No diarrhea. The skin rash overall is better. He notes the rash is "dry" and periodically itchy around his neck. He is applying a moisturizer. Appetite is poor. He does eat at regular intervals. Weight is stable. Energy level continues to be poor. He denies back pain.   Objective: Blood pressure 130/90, pulse 62, temperature 97.3 F (36.3 C), temperature source Oral, resp. rate 18, height 5\' 9"  (1.753 m), weight 158 lb 4.8 oz (71.804 kg).  The thrush or ulcerations. Lungs clear. Regular cardiac rhythm. Port-A-Cath site is without erythema. Abdomen soft. No hepatomegaly. Extremities without edema. Acne-type rash over the face and trunk.  Lab Results: Lab Results  Component Value Date   WBC 5.4 11/09/2012   HGB 12.1* 11/09/2012   HCT 34.9* 11/09/2012   MCV 87.9 11/09/2012   PLT 137* 11/09/2012    Chemistry:    Chemistry      Component Value Date/Time   NA 139 10/26/2012 0809   NA 136 08/03/2012 0830   K 3.6 10/26/2012 0809   K 4.8 08/03/2012 0830   CL 108* 10/26/2012 0809   CL 97 08/03/2012 0830   CO2 21* 10/26/2012 0809   CO2 30 08/03/2012 0830   BUN 13.5 10/26/2012 0809   BUN 12 08/03/2012 0830   CREATININE 0.9 10/26/2012 0809   CREATININE 0.84 08/03/2012 0830      Component Value Date/Time   CALCIUM 8.5 10/26/2012 0809   CALCIUM 9.2 08/03/2012 0830   ALKPHOS 99 10/26/2012 0809   ALKPHOS 56 10/16/2011 0745   AST 32 10/26/2012 0809   AST 38* 10/16/2011 0745   ALT 35 10/26/2012 0809   ALT 46 10/16/2011 0745   BILITOT 0.21 10/26/2012 0809   BILITOT 0.4 10/16/2011 0745       Studies/Results: Ct Chest W Contrast  10/20/2012   *RADIOLOGY REPORT*  Clinical Data:  Restaging metastatic colorectal carcinoma.  Prior chemo radiation therapy.  Ongoing chemotherapy.  CT CHEST, ABDOMEN AND PELVIS WITH CONTRAST  Technique:   Multidetector CT imaging of the chest, abdomen and pelvis was performed following the standard protocol during bolus administration of intravenous contrast.  Contrast: OMNIPAQUE IOHEXOL 300 MG/ML  SOLN  Comparison:  CT 07/29/2012, PET CT 07/22/2011  CT CHEST  Findings:  There is a port in the left anterior chest wall.  No axillary or supraclavicular lymphadenopathy.  Subcarinal lymph node measuring 8 mm is decreased from 12 mm on prior.  Left hilar lymph node measures 5 mm decreased from 11 mm on prior.  No new mediastinal adenopathy.  Review of the lung parenchyma demonstrates a 6-mm lingular nodule (image 40) which compares to 9 mm on prior.  IMPRESSION:  1.  Decrease in mediastinal lymphadenopathy. 2.  Decrease in size of lingular pulmonary nodule.  CT ABDOMEN AND PELVIS  Findings:  No focal hepatic lesion.  The gallbladder, pancreas, spleen, adrenal glands, kidneys are normal.  Stomach is normal.  There is continued mild inflammation along the third portion of duodenum.  There is an enteric colonic anastomosis at the level transverse colon.  The left colon appear normal and stool filled.  Abdominal aorta is normal caliber.  There is enlarged retroperitoneal lymph node adjacent to the splenic vein measuring 24 mm compared to 24 mm on prior.  Small retrocrural lymph node measuring 7 mm compares to 11 mm on prior.  Distal  left periaortic lymph node measures 9 mm compared to 13 mm on prior.  In the pelvis, prostate gland bladder normal.  No pelvic lymphadenopathy.  Review of  bone windows demonstrates no aggressive osseous lesions.  IMPRESSION:  1.  Mild decrease in size of retroperitoneal and retrocrural lymph nodes. 2.  The dominant lymph node adjacent to the splenic vein / celiac trunk is not changed. 3.  No evidence of disease progression.  4. .  Mild inflammation along the third portion the duodenum is similar prior.   Original Report Authenticated By: Genevive Bi, M.D.   Ct Abdomen Pelvis W  Contrast  10/20/2012   *RADIOLOGY REPORT*  Clinical Data:  Restaging metastatic colorectal carcinoma.  Prior chemo radiation therapy.  Ongoing chemotherapy.  CT CHEST, ABDOMEN AND PELVIS WITH CONTRAST  Technique:  Multidetector CT imaging of the chest, abdomen and pelvis was performed following the standard protocol during bolus administration of intravenous contrast.  Contrast: OMNIPAQUE IOHEXOL 300 MG/ML  SOLN  Comparison:  CT 07/29/2012, PET CT 07/22/2011  CT CHEST  Findings:  There is a port in the left anterior chest wall.  No axillary or supraclavicular lymphadenopathy.  Subcarinal lymph node measuring 8 mm is decreased from 12 mm on prior.  Left hilar lymph node measures 5 mm decreased from 11 mm on prior.  No new mediastinal adenopathy.  Review of the lung parenchyma demonstrates a 6-mm lingular nodule (image 40) which compares to 9 mm on prior.  IMPRESSION:  1.  Decrease in mediastinal lymphadenopathy. 2.  Decrease in size of lingular pulmonary nodule.  CT ABDOMEN AND PELVIS  Findings:  No focal hepatic lesion.  The gallbladder, pancreas, spleen, adrenal glands, kidneys are normal.  Stomach is normal.  There is continued mild inflammation along the third portion of duodenum.  There is an enteric colonic anastomosis at the level transverse colon.  The left colon appear normal and stool filled.  Abdominal aorta is normal caliber.  There is enlarged retroperitoneal lymph node adjacent to the splenic vein measuring 24 mm compared to 24 mm on prior.  Small retrocrural lymph node measuring 7 mm compares to 11 mm on prior.  Distal left periaortic lymph node measures 9 mm compared to 13 mm on prior.  In the pelvis, prostate gland bladder normal.  No pelvic lymphadenopathy.  Review of  bone windows demonstrates no aggressive osseous lesions.  IMPRESSION:  1.  Mild decrease in size of retroperitoneal and retrocrural lymph nodes. 2.  The dominant lymph node adjacent to the splenic vein / celiac trunk is not  changed. 3.  No evidence of disease progression.  4. .  Mild inflammation along the third portion the duodenum is similar prior.   Original Report Authenticated By: Genevive Bi, M.D.    Medications: I have reviewed the patient's current medications.  Assessment/Plan:  #1-Stage III (T4, N2) adenocarcinoma of the right colon, status post a right colectomy 11/25/2010. K-ras wild-type.  He began adjuvant FOLFOX chemotherapy 12/31/2010. He completed cycle 12 of FOLFOX on 06/03/2011.  #2-PET scan 12/29/2010 with hypermetabolic retroperitoneal and anterior abdominal adenopathy, postoperative changes of the right colectomy with a possible seroma in the anterior abdomen. Patchy activity was noted in the parotid glands, potentially inflammatory in etiology.  -The hypermetabolic retroperitoneal lymph nodes potentially represented metastatic disease. We presented his case at the GI tumor conference and discussed the case with Dr. Dwain Sarna. A decision was made to proceed with "adjuvant" therapy with the plan for a restaging CT at a  3 to four-month interval.  -The restaging CT on 03/23/2011 revealed a decrease in the periaortic lymphadenopathy, no new adenopathy, and no evidence of liver metastases. This suggests the small retroperitoneal lymph nodes may represent metastatic colon cancer versus resolving "inflammatory" nodes.  -Restaging CT 07/13/2011 revealed increased retroperitoneal lymphadenopathy consistent with metastatic disease.  -Restaging PET scan 07/22/2011 confirmed persistent hypermetabolic retroperitoneal lymph nodes. -Restaging CT 11/12/2011 with a slight increase in the size of retroperitoneal/mesenteric lymph nodes, no other evidence of progressive metastatic disease  -Restaging CT 01/27/2012 with a slight increase in the size of a necrotic celiac node, slight decrease in the para-aortic nodes  -Status post palliative radiation to the abdominal/retroperitoneal lymph nodes completed on  04/04/2012  -Restaging CT 05/31/2012 with a slight decrease in the size of abdominal lymph nodes and a stable mass adjacent to the SMA,? New left lung lesion  -Restaging CT 07/29/2012 with new mediastinal/hilar nodes and a new left lingula nodule. New retrocrural and retroperitoneal nodes.  -initiation of FOLFIRI/panitumumab on an every 2 week schedule 08/17/2012.  -restaging CT 10/19/2012 after 5 cycles of FOLFIRI/panitumumab with improvement in mediastinal adenopathy, a left lingular nodule, retroperitoneal nodes, and a stable node adjacent to the spleen. -Cycle 6 FOLFIRI/Panitumumab 10/26/2012.  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 A basal cell carcinoma was recently removed from a right finger by Dr. Karlyn Agee.  #6-Left leg deep vein thrombosis confirmed on a Doppler ultrasound 01/28/2011, now maintained on Coumadin.  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 He has persistent mild numbness in the fingertips and toes. The numbness does not interfere with activity.  #8-Mild neutropenia on 03/11/2011, he received Neulasta following cycle #6 of FOLFOX. He also received Neulasta support following cycle 8, cycle 10 and cycle #12.  #9-Thrombocytopenia secondary to chemotherapy. He has mild thrombocytopenia on labs today.  #10-Low back pain-most likely related to the necrotic celiac node. He completed palliative radiation on 04/04/2012. The pain resolved. He recently presented with progressive back pain-likely secondary to progression of retrocrural/retroperitoneal adenopathy. The low back pain has resolved.  #11-Fever/rash on 07/29/2012-? contrast reaction.  #12-Clinical and CT evidence of a fractured Port-A-Cath 07/29/2012. The Port-A-Cath was removed on  08/04/2012. There was a linear laceration in the line. New Port-A-Cath placed on 08/15/2012.  #13-skin rash secondary to panitumumab-he will continue minocycline. The rash is stable.  #14-external otitis. He completed a course of Ciprodex eardrops.  Disposition-Mr. Dukes appears stable. Plan to proceed with cycle 7 FOLFIRI/Panitumumab today as scheduled. He will return for a followup visit and cycle 8 in 2 weeks. He will contact the office in the interim with any problems.  Plan reviewed with Dr. Truett Perna.   Lonna Cobb ANP/GNP-BC

## 2012-11-09 NOTE — Patient Instructions (Addendum)
Continue coumadin 4mg  daily. Recheck INR in 2 weeks with next scheduled appointments on 11/23/12; lab at 8:15am, Dr. Truett Perna at 8:45am, treatment at 9:45am and coumadin clinic at 10:00am.

## 2012-11-09 NOTE — Progress Notes (Signed)
Pt seen in infusion today. INR=2.0 on 4mg  daily No changes to report. Will recheck INR with next infusion on 11/23/12

## 2012-11-09 NOTE — Telephone Encounter (Signed)
Per staff phone call and POF I have schedueld appts.  JMW  

## 2012-11-09 NOTE — Telephone Encounter (Signed)
gv and printed appt sched and avs for pt....MW added tx   °

## 2012-11-11 ENCOUNTER — Ambulatory Visit (HOSPITAL_BASED_OUTPATIENT_CLINIC_OR_DEPARTMENT_OTHER): Payer: BC Managed Care – PPO

## 2012-11-11 VITALS — BP 129/88 | HR 66 | Temp 98.2°F | Resp 20

## 2012-11-11 DIAGNOSIS — C189 Malignant neoplasm of colon, unspecified: Secondary | ICD-10-CM

## 2012-11-11 MED ORDER — HEPARIN SOD (PORK) LOCK FLUSH 100 UNIT/ML IV SOLN
500.0000 [IU] | Freq: Once | INTRAVENOUS | Status: AC | PRN
  Administered 2012-11-11: 500 [IU]
  Filled 2012-11-11: qty 5

## 2012-11-11 MED ORDER — SODIUM CHLORIDE 0.9 % IJ SOLN
10.0000 mL | INTRAMUSCULAR | Status: DC | PRN
  Administered 2012-11-11: 10 mL
  Filled 2012-11-11: qty 10

## 2012-11-20 ENCOUNTER — Other Ambulatory Visit: Payer: Self-pay | Admitting: Oncology

## 2012-11-23 ENCOUNTER — Ambulatory Visit (HOSPITAL_BASED_OUTPATIENT_CLINIC_OR_DEPARTMENT_OTHER): Payer: BC Managed Care – PPO

## 2012-11-23 ENCOUNTER — Ambulatory Visit: Payer: BC Managed Care – PPO | Admitting: Pharmacist

## 2012-11-23 ENCOUNTER — Telehealth: Payer: Self-pay | Admitting: *Deleted

## 2012-11-23 ENCOUNTER — Ambulatory Visit (HOSPITAL_BASED_OUTPATIENT_CLINIC_OR_DEPARTMENT_OTHER): Payer: BC Managed Care – PPO | Admitting: Oncology

## 2012-11-23 ENCOUNTER — Telehealth: Payer: Self-pay | Admitting: Oncology

## 2012-11-23 ENCOUNTER — Other Ambulatory Visit: Payer: Self-pay | Admitting: Nurse Practitioner

## 2012-11-23 ENCOUNTER — Other Ambulatory Visit (HOSPITAL_BASED_OUTPATIENT_CLINIC_OR_DEPARTMENT_OTHER): Payer: BC Managed Care – PPO

## 2012-11-23 VITALS — BP 125/79 | HR 59 | Temp 97.5°F | Resp 18 | Ht 69.0 in | Wt 157.6 lb

## 2012-11-23 DIAGNOSIS — Z7901 Long term (current) use of anticoagulants: Secondary | ICD-10-CM

## 2012-11-23 DIAGNOSIS — D696 Thrombocytopenia, unspecified: Secondary | ICD-10-CM

## 2012-11-23 DIAGNOSIS — C189 Malignant neoplasm of colon, unspecified: Secondary | ICD-10-CM

## 2012-11-23 DIAGNOSIS — C778 Secondary and unspecified malignant neoplasm of lymph nodes of multiple regions: Secondary | ICD-10-CM

## 2012-11-23 DIAGNOSIS — C182 Malignant neoplasm of ascending colon: Secondary | ICD-10-CM

## 2012-11-23 DIAGNOSIS — I82402 Acute embolism and thrombosis of unspecified deep veins of left lower extremity: Secondary | ICD-10-CM

## 2012-11-23 DIAGNOSIS — Z5112 Encounter for antineoplastic immunotherapy: Secondary | ICD-10-CM

## 2012-11-23 DIAGNOSIS — Z5111 Encounter for antineoplastic chemotherapy: Secondary | ICD-10-CM

## 2012-11-23 DIAGNOSIS — Z86718 Personal history of other venous thrombosis and embolism: Secondary | ICD-10-CM

## 2012-11-23 DIAGNOSIS — I82409 Acute embolism and thrombosis of unspecified deep veins of unspecified lower extremity: Secondary | ICD-10-CM

## 2012-11-23 LAB — COMPREHENSIVE METABOLIC PANEL (CC13)
ALT: 40 U/L (ref 0–55)
CO2: 26 mEq/L (ref 22–29)
Calcium: 8.5 mg/dL (ref 8.4–10.4)
Chloride: 106 mEq/L (ref 98–109)
Potassium: 3.8 mEq/L (ref 3.5–5.1)
Sodium: 139 mEq/L (ref 136–145)
Total Protein: 6.3 g/dL — ABNORMAL LOW (ref 6.4–8.3)

## 2012-11-23 LAB — CBC WITH DIFFERENTIAL/PLATELET
Eosinophils Absolute: 0.2 10*3/uL (ref 0.0–0.5)
MONO#: 0.3 10*3/uL (ref 0.1–0.9)
NEUT#: 2.9 10*3/uL (ref 1.5–6.5)
Platelets: 135 10*3/uL — ABNORMAL LOW (ref 140–400)
RBC: 3.97 10*6/uL — ABNORMAL LOW (ref 4.20–5.82)
RDW: 15.4 % — ABNORMAL HIGH (ref 11.0–14.6)
WBC: 4 10*3/uL (ref 4.0–10.3)

## 2012-11-23 LAB — MAGNESIUM (CC13): Magnesium: 2.1 mg/dl (ref 1.5–2.5)

## 2012-11-23 LAB — PROTIME-INR
INR: 2.6 (ref 2.00–3.50)
Protime: 31.2 Seconds — ABNORMAL HIGH (ref 10.6–13.4)

## 2012-11-23 MED ORDER — SODIUM CHLORIDE 0.9 % IV SOLN
6.0000 mg/kg | Freq: Once | INTRAVENOUS | Status: AC
  Administered 2012-11-23: 460 mg via INTRAVENOUS
  Filled 2012-11-23: qty 23

## 2012-11-23 MED ORDER — SODIUM CHLORIDE 0.9 % IV SOLN
Freq: Once | INTRAVENOUS | Status: AC
  Administered 2012-11-23: 10:00:00 via INTRAVENOUS

## 2012-11-23 MED ORDER — ONDANSETRON 16 MG/50ML IVPB (CHCC)
16.0000 mg | Freq: Once | INTRAVENOUS | Status: AC
  Administered 2012-11-23: 16 mg via INTRAVENOUS

## 2012-11-23 MED ORDER — FLUOROURACIL CHEMO INJECTION 5 GM/100ML
2400.0000 mg/m2 | INTRAVENOUS | Status: DC
  Administered 2012-11-23: 4600 mg via INTRAVENOUS
  Filled 2012-11-23: qty 92

## 2012-11-23 MED ORDER — FLUOROURACIL CHEMO INJECTION 2.5 GM/50ML
400.0000 mg/m2 | Freq: Once | INTRAVENOUS | Status: AC
  Administered 2012-11-23: 750 mg via INTRAVENOUS
  Filled 2012-11-23: qty 15

## 2012-11-23 MED ORDER — DEXAMETHASONE SODIUM PHOSPHATE 20 MG/5ML IJ SOLN
20.0000 mg | Freq: Once | INTRAMUSCULAR | Status: AC
  Administered 2012-11-23: 20 mg via INTRAVENOUS

## 2012-11-23 MED ORDER — DEXTROSE 5 % IV SOLN
400.0000 mg/m2 | Freq: Once | INTRAVENOUS | Status: AC
  Administered 2012-11-23: 764 mg via INTRAVENOUS
  Filled 2012-11-23: qty 38.2

## 2012-11-23 MED ORDER — IRINOTECAN HCL CHEMO INJECTION 100 MG/5ML
180.0000 mg/m2 | Freq: Once | INTRAVENOUS | Status: AC
  Administered 2012-11-23: 344 mg via INTRAVENOUS
  Filled 2012-11-23: qty 17.2

## 2012-11-23 MED ORDER — ATROPINE SULFATE 1 MG/ML IJ SOLN
0.5000 mg | Freq: Once | INTRAMUSCULAR | Status: AC | PRN
  Administered 2012-11-23: 0.5 mg via INTRAVENOUS

## 2012-11-23 NOTE — Telephone Encounter (Signed)
gv and printed appt sched and avs for pt...emailed MW to add tx... °

## 2012-11-23 NOTE — Progress Notes (Signed)
Terry Vargas    OFFICE PROGRESS NOTE   INTERVAL HISTORY:   He completed another cycle of FOLFIRI/panitumumab on 11/09/2012. He reports diarrhea following this cycle of chemotherapy. He has noted mild discomfort in the bilateral upper abdomen and low chest for the past several days. He relates this to lifting his niece. No back pain. His appetite has been poor. He continues to work. The skin rash has improved.  Objective:  Vital signs in last 24 hours:  Blood pressure 125/79, pulse 59, temperature 97.5 F (36.4 C), temperature source Oral, resp. rate 18, height 5\' 9"  (1.753 m), weight 157 lb 9.6 oz (71.487 kg).    HEENT: no thrush or ulcers Resp: lungs clear bilaterally Cardio: regular rate and rhythm GI: no hepatomegaly, nontender, no mass Vascular: no leg edema  Skin:erythematous acne type rash over the face and trunk, several superficial linear ulcerations at the fingers and hands   Portacath/PICC-with surrounding erythema, nontender  Lab Results:  Lab Results  Component Value Date   WBC 4.0 11/23/2012   HGB 12.2* 11/23/2012   HCT 35.1* 11/23/2012   MCV 88.4 11/23/2012   PLT 135* 11/23/2012  ANC 2.9 PT/INR 2.6     Medications: I have reviewed the patient's current medications.  Assessment/Plan: #1-Stage III (T4, N2) adenocarcinoma of the right colon, status post a right colectomy 11/25/2010. K-ras wild-type.  He began adjuvant FOLFOX chemotherapy 12/31/2010. He completed cycle 12 of FOLFOX on 06/03/2011.  #2-PET scan 12/29/2010 with hypermetabolic retroperitoneal and anterior abdominal adenopathy, postoperative changes of the right colectomy with a possible seroma in the anterior abdomen. Patchy activity was noted in the parotid glands, potentially inflammatory in etiology.  -The hypermetabolic retroperitoneal lymph nodes potentially represented metastatic disease. We presented his case at the GI tumor conference and discussed the case with Dr. Dwain Sarna. A  decision was made to proceed with "adjuvant" therapy with the plan for a restaging CT at a 3 to four-month interval.  -The restaging CT on 03/23/2011 revealed a decrease in the periaortic lymphadenopathy, no new adenopathy, and no evidence of liver metastases. This suggests the small retroperitoneal lymph nodes may represent metastatic colon cancer versus resolving "inflammatory" nodes.  -Restaging CT 07/13/2011 revealed increased retroperitoneal lymphadenopathy consistent with metastatic disease.  -Restaging PET scan 07/22/2011 confirmed persistent hypermetabolic retroperitoneal lymph nodes. -Restaging CT 11/12/2011 with a slight increase in the size of retroperitoneal/mesenteric lymph nodes, no other evidence of progressive metastatic disease  -Restaging CT 01/27/2012 with a slight increase in the size of a necrotic celiac node, slight decrease in the para-aortic nodes  -Status post palliative radiation to the abdominal/retroperitoneal lymph nodes completed on 04/04/2012  -Restaging CT 05/31/2012 with a slight decrease in the size of abdominal lymph nodes and a stable mass adjacent to the SMA,? New left lung lesion  -Restaging CT 07/29/2012 with new mediastinal/hilar nodes and a new left lingula nodule. New retrocrural and retroperitoneal nodes.  -initiation of FOLFIRI/panitumumab on an every 2 week schedule 08/17/2012.  -restaging CT 10/19/2012 after 5 cycles of FOLFIRI/panitumumab with improvement in mediastinal adenopathy, a left lingular nodule, retroperitoneal nodes, and a stable node adjacent to the spleen.  -Cycle 7 FOLFIRI/Panitumumab 11/09/2012.  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 A basal cell carcinoma was recently removed from a right finger by Dr. Karlyn Agee.  #6-Left  leg deep vein thrombosis  confirmed on a Doppler ultrasound 01/28/2011, now maintained on Coumadin.  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 He has persistent mild numbness in the fingertips and toes. The numbness does not interfere with activity.  #8-Mild neutropenia on 03/11/2011, he received Neulasta following cycle #6 of FOLFOX. He also received Neulasta support following cycle 8, cycle 10 and cycle #12.  #9-Thrombocytopenia secondary to chemotherapy. He has mild thrombocytopenia on labs today.  #10-Low back pain-most likely related to the necrotic celiac node. He completed palliative radiation on 04/04/2012. The pain resolved. He recently presented with progressive back pain-likely secondary to progression of retrocrural/retroperitoneal adenopathy. The low back pain has resolved.  #11-Fever/rash on 07/29/2012-? contrast reaction.  #12-Clinical and CT evidence of a fractured Port-A-Cath 07/29/2012. The Port-A-Cath was removed on 08/04/2012. There was a linear laceration in the line. New Port-A-Cath placed on 08/15/2012.  #13-skin rash secondary to panitumumab-he will continue minocycline. The rash is stable.    Disposition:  His overall status appears unchanged. The plan is to proceed with cycle 8 of FOLFIRI/panitumumab today. He will undergo a restaging CT evaluation after cycle 10.  He will contact us if the abdominal discomfort progresses. He will return for an office visit and chemotherapy in 2 weeks.   Thornton Papas, MD  11/23/2012  4:19 PM

## 2012-11-23 NOTE — Progress Notes (Signed)
INR remains at goal on coumadin 4mg  daily.  Terry Vargas here for FOLFIRI/Vectibex.  He states that his appetite is decreased but is finding things to eat.  Has not been drinking his shakes b/c" has not agreed with him."  Terry Vargas's energy level is low, but he continues to exercise.  Will continue coumadin 4mg  daily and check PT/INR in 2 weeks with next chemo appt.  Coumadin 4mg  #20 [id number] exp.02/2014 given to pt.

## 2012-11-23 NOTE — Patient Instructions (Addendum)
[facility] Discharge Instructions for Patients Receiving Chemotherapy  Today you received the following chemotherapy agents panitumamab, irinotecan, 47fu and leucovorin.  To help prevent nausea and vomiting after your treatment, we encourage you to take your nausea medication.   If you develop nausea and vomiting that is not controlled by your nausea medication, call the clinic.   BELOW ARE SYMPTOMS THAT SHOULD BE REPORTED IMMEDIATELY:  *FEVER GREATER THAN 100.5 F  *CHILLS WITH OR WITHOUT FEVER  NAUSEA AND VOMITING THAT IS NOT CONTROLLED WITH YOUR NAUSEA MEDICATION  *UNUSUAL SHORTNESS OF BREATH  *UNUSUAL BRUISING OR BLEEDING  TENDERNESS IN MOUTH AND THROAT WITH OR WITHOUT PRESENCE OF ULCERS  *URINARY PROBLEMS  *BOWEL PROBLEMS  UNUSUAL RASH Items with * indicate a potential emergency and should be followed up as soon as possible.  Feel free to call the clinic you have any questions or concerns. The clinic phone number is [phone].

## 2012-11-23 NOTE — Telephone Encounter (Signed)
Per staff phone call and POF I have schedueld appts.  JMW  

## 2012-11-25 ENCOUNTER — Ambulatory Visit (HOSPITAL_BASED_OUTPATIENT_CLINIC_OR_DEPARTMENT_OTHER): Payer: BC Managed Care – PPO

## 2012-11-25 VITALS — BP 134/88 | HR 78 | Temp 98.5°F

## 2012-11-25 DIAGNOSIS — C182 Malignant neoplasm of ascending colon: Secondary | ICD-10-CM

## 2012-11-25 DIAGNOSIS — Z452 Encounter for adjustment and management of vascular access device: Secondary | ICD-10-CM

## 2012-11-25 DIAGNOSIS — C189 Malignant neoplasm of colon, unspecified: Secondary | ICD-10-CM

## 2012-11-25 MED ORDER — SODIUM CHLORIDE 0.9 % IJ SOLN
10.0000 mL | INTRAMUSCULAR | Status: DC | PRN
  Administered 2012-11-25: 10 mL
  Filled 2012-11-25: qty 10

## 2012-11-25 MED ORDER — HEPARIN SOD (PORK) LOCK FLUSH 100 UNIT/ML IV SOLN
500.0000 [IU] | Freq: Once | INTRAVENOUS | Status: AC | PRN
  Administered 2012-11-25: 500 [IU]
  Filled 2012-11-25: qty 5

## 2012-12-04 ENCOUNTER — Other Ambulatory Visit: Payer: Self-pay | Admitting: Oncology

## 2012-12-07 ENCOUNTER — Other Ambulatory Visit (HOSPITAL_BASED_OUTPATIENT_CLINIC_OR_DEPARTMENT_OTHER): Payer: BC Managed Care – PPO

## 2012-12-07 ENCOUNTER — Ambulatory Visit: Payer: BC Managed Care – PPO | Admitting: Pharmacist

## 2012-12-07 ENCOUNTER — Telehealth: Payer: Self-pay | Admitting: *Deleted

## 2012-12-07 ENCOUNTER — Ambulatory Visit (HOSPITAL_BASED_OUTPATIENT_CLINIC_OR_DEPARTMENT_OTHER): Payer: BC Managed Care – PPO

## 2012-12-07 ENCOUNTER — Telehealth: Payer: Self-pay | Admitting: Oncology

## 2012-12-07 ENCOUNTER — Ambulatory Visit (HOSPITAL_BASED_OUTPATIENT_CLINIC_OR_DEPARTMENT_OTHER): Payer: BC Managed Care – PPO | Admitting: Nurse Practitioner

## 2012-12-07 VITALS — BP 133/82 | HR 63 | Temp 97.6°F | Resp 18 | Ht 69.0 in | Wt 157.5 lb

## 2012-12-07 DIAGNOSIS — C182 Malignant neoplasm of ascending colon: Secondary | ICD-10-CM

## 2012-12-07 DIAGNOSIS — Z5112 Encounter for antineoplastic immunotherapy: Secondary | ICD-10-CM

## 2012-12-07 DIAGNOSIS — D696 Thrombocytopenia, unspecified: Secondary | ICD-10-CM

## 2012-12-07 DIAGNOSIS — C778 Secondary and unspecified malignant neoplasm of lymph nodes of multiple regions: Secondary | ICD-10-CM

## 2012-12-07 DIAGNOSIS — C189 Malignant neoplasm of colon, unspecified: Secondary | ICD-10-CM

## 2012-12-07 DIAGNOSIS — Z86718 Personal history of other venous thrombosis and embolism: Secondary | ICD-10-CM

## 2012-12-07 DIAGNOSIS — Z5111 Encounter for antineoplastic chemotherapy: Secondary | ICD-10-CM

## 2012-12-07 DIAGNOSIS — I82409 Acute embolism and thrombosis of unspecified deep veins of unspecified lower extremity: Secondary | ICD-10-CM

## 2012-12-07 DIAGNOSIS — E876 Hypokalemia: Secondary | ICD-10-CM

## 2012-12-07 LAB — CBC WITH DIFFERENTIAL/PLATELET
Basophils Absolute: 0 10*3/uL (ref 0.0–0.1)
Eosinophils Absolute: 0.3 10*3/uL (ref 0.0–0.5)
HGB: 12.5 g/dL — ABNORMAL LOW (ref 13.0–17.1)
MONO#: 0.4 10*3/uL (ref 0.1–0.9)
NEUT#: 3.3 10*3/uL (ref 1.5–6.5)
RBC: 3.96 10*6/uL — ABNORMAL LOW (ref 4.20–5.82)
RDW: 15.9 % — ABNORMAL HIGH (ref 11.0–14.6)
WBC: 4.6 10*3/uL (ref 4.0–10.3)
lymph#: 0.5 10*3/uL — ABNORMAL LOW (ref 0.9–3.3)

## 2012-12-07 LAB — POCT INR: INR: 4.2

## 2012-12-07 LAB — COMPREHENSIVE METABOLIC PANEL (CC13)
ALT: 43 U/L (ref 0–55)
AST: 39 U/L — ABNORMAL HIGH (ref 5–34)
Albumin: 2.9 g/dL — ABNORMAL LOW (ref 3.5–5.0)
Alkaline Phosphatase: 81 U/L (ref 40–150)
BUN: 9.1 mg/dL (ref 7.0–26.0)
Potassium: 3.3 mEq/L — ABNORMAL LOW (ref 3.5–5.1)
Sodium: 138 mEq/L (ref 136–145)
Total Protein: 6.1 g/dL — ABNORMAL LOW (ref 6.4–8.3)

## 2012-12-07 LAB — PROTIME-INR
INR: 4.2 — ABNORMAL HIGH (ref 2.00–3.50)
Protime: 50.4 Seconds — ABNORMAL HIGH (ref 10.6–13.4)

## 2012-12-07 MED ORDER — DEXAMETHASONE SODIUM PHOSPHATE 20 MG/5ML IJ SOLN
20.0000 mg | Freq: Once | INTRAMUSCULAR | Status: AC
  Administered 2012-12-07: 20 mg via INTRAVENOUS

## 2012-12-07 MED ORDER — ONDANSETRON 16 MG/50ML IVPB (CHCC)
16.0000 mg | Freq: Once | INTRAVENOUS | Status: AC
  Administered 2012-12-07: 16 mg via INTRAVENOUS

## 2012-12-07 MED ORDER — SODIUM CHLORIDE 0.9 % IV SOLN
6.0000 mg/kg | Freq: Once | INTRAVENOUS | Status: AC
  Administered 2012-12-07: 460 mg via INTRAVENOUS
  Filled 2012-12-07: qty 23

## 2012-12-07 MED ORDER — LEUCOVORIN CALCIUM INJECTION 350 MG
400.0000 mg/m2 | Freq: Once | INTRAVENOUS | Status: AC
  Administered 2012-12-07: 764 mg via INTRAVENOUS
  Filled 2012-12-07: qty 38.2

## 2012-12-07 MED ORDER — ATROPINE SULFATE 1 MG/ML IJ SOLN
0.5000 mg | Freq: Once | INTRAMUSCULAR | Status: AC | PRN
  Administered 2012-12-07: 0.5 mg via INTRAVENOUS

## 2012-12-07 MED ORDER — SODIUM CHLORIDE 0.9 % IV SOLN
Freq: Once | INTRAVENOUS | Status: AC
  Administered 2012-12-07: 11:00:00 via INTRAVENOUS

## 2012-12-07 MED ORDER — FLUOROURACIL CHEMO INJECTION 2.5 GM/50ML
400.0000 mg/m2 | Freq: Once | INTRAVENOUS | Status: AC
  Administered 2012-12-07: 750 mg via INTRAVENOUS
  Filled 2012-12-07: qty 15

## 2012-12-07 MED ORDER — POTASSIUM CHLORIDE CRYS ER 20 MEQ PO TBCR: 20.0000 meq | EXTENDED_RELEASE_TABLET | Freq: Every day | ORAL | Status: DC

## 2012-12-07 MED ORDER — IRINOTECAN HCL CHEMO INJECTION 100 MG/5ML
180.0000 mg/m2 | Freq: Once | INTRAVENOUS | Status: AC
  Administered 2012-12-07: 344 mg via INTRAVENOUS
  Filled 2012-12-07: qty 17.2

## 2012-12-07 MED ORDER — SODIUM CHLORIDE 0.9 % IV SOLN
2400.0000 mg/m2 | INTRAVENOUS | Status: DC
  Administered 2012-12-07: 4600 mg via INTRAVENOUS
  Filled 2012-12-07: qty 92

## 2012-12-07 NOTE — Telephone Encounter (Signed)
Per staff message and POF I have scheduled appts.  JMW  

## 2012-12-07 NOTE — Progress Notes (Signed)
Today, the patients skin around the port was pinkish/reddish. I used the Opsite dressing, for the first time, instead of the Sorbaview dressing. Per Chrystie Nose, RN, place paper tape around the opsite to make it more secure. Patient wants to try this dressing to see if he would like to start using it versus the Sorbaview.

## 2012-12-07 NOTE — Patient Instructions (Addendum)
[facility] Discharge Instructions for Patients Receiving Chemotherapy  Today you received the following chemotherapy agents Camptosar, Leucovorin and Adrucil.   To help prevent nausea and vomiting after your treatment, we encourage you to take your nausea medication as prescribed.   If you develop nausea and vomiting that is not controlled by your nausea medication, call the clinic.   BELOW ARE SYMPTOMS THAT SHOULD BE REPORTED IMMEDIATELY:  *FEVER GREATER THAN 100.5 F  *CHILLS WITH OR WITHOUT FEVER  NAUSEA AND VOMITING THAT IS NOT CONTROLLED WITH YOUR NAUSEA MEDICATION  *UNUSUAL SHORTNESS OF BREATH  *UNUSUAL BRUISING OR BLEEDING  TENDERNESS IN MOUTH AND THROAT WITH OR WITHOUT PRESENCE OF ULCERS  *URINARY PROBLEMS  *BOWEL PROBLEMS  UNUSUAL RASH Items with * indicate a potential emergency and should be followed up as soon as possible.  Feel free to call the clinic you have any questions or concerns. The clinic phone number is [phone].    

## 2012-12-07 NOTE — Patient Instructions (Addendum)
Hold your dose of Coumadin tomorrow (7/24) and Friday (7/25). Decrease dose to 2 mg on Tues and Thurs; 4 mg all other days.Recheck INR in 2 weeks with next appointments on 12/21/12; lab at 8:15am and will see Dr. Truett Perna.  We will see you in infusion.

## 2012-12-07 NOTE — Progress Notes (Signed)
Patient's INR is supratherapeutic today at 4.2 (goal 2-3).  He reports decreased appetite, but does continue to eat despite this.  He reports eating less salads and eating more pasta and proteins.  He reports no bleeding or bruising.    Patient has been taking 4mg  warfarin daily.  We will hold his dose of warfarin tomorrow (7/24) and Friday (7/25), as he has already taken his dose today.  Then he will begin taking 2 mg warfarin Tues/Thurs and 4 mg all other days.  We will recheck his INR in 2 weeks when he is back for his next appointment with Dr. Truett Perna.  Lillia Pauls, PharmD Clinical Pharmacist 12/07/2012 11:33 AM

## 2012-12-07 NOTE — Telephone Encounter (Signed)
gv and printed appt sched and avs for pt....emailed MW to add tx....gv pt barium °

## 2012-12-07 NOTE — Progress Notes (Signed)
OFFICE PROGRESS NOTE  Interval history:  [name] returns as scheduled. He completed cycle 8 FOLFIRI/Panitumumab on 11/23/2012. He denies nausea/vomiting. No mouth sores. He had some diarrhea which was controlled with Imodium. He notes abdominal pain beginning approximately 1 week after chemotherapy occurring with the past 2 cycles. The pain lasts for several days and then resolves. He takes oxycodone as needed during this time. The skin rash continues to be improved.   Objective: Blood pressure 133/82, pulse 63, temperature 97.6 F (36.4 C), temperature source Oral, resp. rate 18, height 5\' 9"  (1.753 m), weight 157 lb 8 oz (71.442 kg).  Oropharynx is without thrush or ulceration. Lungs are clear. Regular cardiac rhythm. Port-A-Cath site with erythema related to the skin rash. Regular cardiac rhythm. Abdomen soft and nontender. No hepatomegaly. Extremities without edema. Erythematous acne-type rash over the face and trunk.  Lab Results: Lab Results  Component Value Date   WBC 4.6 12/07/2012   HGB 12.5* 12/07/2012   HCT 36.1* 12/07/2012   MCV 91.1 12/07/2012   PLT 160 12/07/2012    Chemistry:    Chemistry      Component Value Date/Time   NA 138 12/07/2012 0825   NA 136 08/03/2012 0830   K 3.3* 12/07/2012 0825   K 4.8 08/03/2012 0830   CL 108* 11/09/2012 0835   CL 97 08/03/2012 0830   CO2 24 12/07/2012 0825   CO2 30 08/03/2012 0830   BUN 9.1 12/07/2012 0825   BUN 12 08/03/2012 0830   CREATININE 0.9 12/07/2012 0825   CREATININE 0.84 08/03/2012 0830      Component Value Date/Time   CALCIUM 8.7 12/07/2012 0825   CALCIUM 9.2 08/03/2012 0830   ALKPHOS 81 12/07/2012 0825   ALKPHOS 56 10/16/2011 0745   AST 39* 12/07/2012 0825   AST 38* 10/16/2011 0745   ALT 43 12/07/2012 0825   ALT 46 10/16/2011 0745   BILITOT 0.28 12/07/2012 0825   BILITOT 0.4 10/16/2011 0745       Studies/Results: No results found.  Medications: I have reviewed the patient's current  medications.  Assessment/Plan:  #1-Stage III (T4, N2) adenocarcinoma of the right colon, status post a right colectomy 11/25/2010. K-ras wild-type.  He began adjuvant FOLFOX chemotherapy 12/31/2010. He completed cycle 12 of FOLFOX on 06/03/2011.  #2-PET scan 12/29/2010 with hypermetabolic retroperitoneal and anterior abdominal adenopathy, postoperative changes of the right colectomy with a possible seroma in the anterior abdomen. Patchy activity was noted in the parotid glands, potentially inflammatory in etiology.  -The hypermetabolic retroperitoneal lymph nodes potentially represented metastatic disease. We presented his case at the GI tumor conference and discussed the case with Dr. Dwain Sarna. A decision was made to proceed with "adjuvant" therapy with the plan for a restaging CT at a 3 to four-month interval.  -The restaging CT on 03/23/2011 revealed a decrease in the periaortic lymphadenopathy, no new adenopathy, and no evidence of liver metastases. This suggests the small retroperitoneal lymph nodes may represent metastatic colon cancer versus resolving "inflammatory" nodes.  -Restaging CT 07/13/2011 revealed increased retroperitoneal lymphadenopathy consistent with metastatic disease.  -Restaging PET scan 07/22/2011 confirmed persistent hypermetabolic retroperitoneal lymph nodes. -Restaging CT 11/12/2011 with a slight increase in the size of retroperitoneal/mesenteric lymph nodes, no other evidence of progressive metastatic disease  -Restaging CT 01/27/2012 with a slight increase in the size of a necrotic celiac node, slight decrease in the para-aortic nodes  -Status post palliative radiation to the abdominal/retroperitoneal lymph nodes completed on 04/04/2012  -Restaging CT 05/31/2012 with a  slight decrease in the size of abdominal lymph nodes and a stable mass adjacent to the SMA,? New left lung lesion  -Restaging CT 07/29/2012 with new mediastinal/hilar nodes and a new left lingula nodule.  New retrocrural and retroperitoneal nodes.  -initiation of FOLFIRI/panitumumab on an every 2 week schedule 08/17/2012.  -restaging CT 10/19/2012 after 5 cycles of FOLFIRI/panitumumab with improvement in mediastinal adenopathy, a left lingular nodule, retroperitoneal nodes, and a stable node adjacent to the spleen.  -Cycle 7 FOLFIRI/Panitumumab 11/09/2012.  -Cycle 8 FOLFIRI/Panitumumab 11/23/2012. 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 A basal cell carcinoma was recently removed from a right finger by Dr. Karlyn Agee.  #6-Left leg deep vein thrombosis confirmed on a Doppler ultrasound 01/28/2011, now maintained on Coumadin.  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 He has persistent mild numbness in the fingertips and toes. The numbness does not interfere with activity.  #8-Mild neutropenia on 03/11/2011, he received Neulasta following cycle #6 of FOLFOX. He also received Neulasta support following cycle 8, cycle 10 and cycle #12.  #9-Thrombocytopenia secondary to chemotherapy. He has mild thrombocytopenia on labs today.  #10-Low back pain-most likely related to the necrotic celiac node. He completed palliative radiation on 04/04/2012. The pain resolved. He recently presented with progressive back pain-likely secondary to progression of retrocrural/retroperitoneal adenopathy. The low back pain has resolved.  #11-Fever/rash on 07/29/2012-? contrast reaction.  #12-Clinical and CT evidence of a fractured Port-A-Cath 07/29/2012. The Port-A-Cath was removed on 08/04/2012. There was a linear laceration in the line. New Port-A-Cath placed on 08/15/2012.  #13-skin rash secondary to panitumumab-he will continue minocycline. The rash is stable.  #14-abdominal pain beginning  approximately 1 week after treatment occurring with cycles 7 and 8 FOLFIRI/Panitumumab. #15-hypokalemia. He will begin K-Dur 20 mEq daily.  Disposition-Mr. Hilyard appears stable. He has completed 8 cycles of FOLFIRI/Panitumumab. Plan to proceed with cycle 9 today as scheduled. He will return for a followup visit and cycle 10 in 2 weeks. Restaging CT evaluation is planned after cycle 10.   He will contact the office prior to his next visit with any problems. We specifically discussed worsening abdominal pain.  Plan reviewed with Dr. Truett Perna.   Lonna Cobb ANP/GNP-BC

## 2012-12-08 ENCOUNTER — Other Ambulatory Visit: Payer: Self-pay | Admitting: Certified Registered Nurse Anesthetist

## 2012-12-09 ENCOUNTER — Ambulatory Visit (HOSPITAL_BASED_OUTPATIENT_CLINIC_OR_DEPARTMENT_OTHER): Payer: BC Managed Care – PPO

## 2012-12-09 VITALS — BP 130/78 | HR 70 | Temp 98.0°F

## 2012-12-09 DIAGNOSIS — C778 Secondary and unspecified malignant neoplasm of lymph nodes of multiple regions: Secondary | ICD-10-CM

## 2012-12-09 DIAGNOSIS — Z452 Encounter for adjustment and management of vascular access device: Secondary | ICD-10-CM

## 2012-12-09 DIAGNOSIS — C182 Malignant neoplasm of ascending colon: Secondary | ICD-10-CM

## 2012-12-09 DIAGNOSIS — C189 Malignant neoplasm of colon, unspecified: Secondary | ICD-10-CM

## 2012-12-09 MED ORDER — HEPARIN SOD (PORK) LOCK FLUSH 100 UNIT/ML IV SOLN
500.0000 [IU] | Freq: Once | INTRAVENOUS | Status: AC | PRN
  Administered 2012-12-09: 500 [IU]
  Filled 2012-12-09: qty 5

## 2012-12-09 MED ORDER — SODIUM CHLORIDE 0.9 % IJ SOLN
10.0000 mL | INTRAMUSCULAR | Status: DC | PRN
  Administered 2012-12-09: 10 mL
  Filled 2012-12-09: qty 10

## 2012-12-21 ENCOUNTER — Telehealth: Payer: Self-pay | Admitting: Oncology

## 2012-12-21 ENCOUNTER — Ambulatory Visit: Payer: BC Managed Care – PPO | Admitting: Pharmacist

## 2012-12-21 ENCOUNTER — Other Ambulatory Visit (HOSPITAL_BASED_OUTPATIENT_CLINIC_OR_DEPARTMENT_OTHER): Payer: BC Managed Care – PPO

## 2012-12-21 ENCOUNTER — Ambulatory Visit (HOSPITAL_BASED_OUTPATIENT_CLINIC_OR_DEPARTMENT_OTHER): Payer: BC Managed Care – PPO | Admitting: Oncology

## 2012-12-21 ENCOUNTER — Ambulatory Visit (HOSPITAL_BASED_OUTPATIENT_CLINIC_OR_DEPARTMENT_OTHER): Payer: BC Managed Care – PPO

## 2012-12-21 VITALS — BP 130/84 | HR 65 | Temp 97.0°F | Resp 18 | Ht 69.0 in | Wt 158.5 lb

## 2012-12-21 DIAGNOSIS — C189 Malignant neoplasm of colon, unspecified: Secondary | ICD-10-CM

## 2012-12-21 DIAGNOSIS — C182 Malignant neoplasm of ascending colon: Secondary | ICD-10-CM

## 2012-12-21 DIAGNOSIS — C778 Secondary and unspecified malignant neoplasm of lymph nodes of multiple regions: Secondary | ICD-10-CM

## 2012-12-21 DIAGNOSIS — R109 Unspecified abdominal pain: Secondary | ICD-10-CM

## 2012-12-21 DIAGNOSIS — I82409 Acute embolism and thrombosis of unspecified deep veins of unspecified lower extremity: Secondary | ICD-10-CM

## 2012-12-21 DIAGNOSIS — I82402 Acute embolism and thrombosis of unspecified deep veins of left lower extremity: Secondary | ICD-10-CM

## 2012-12-21 DIAGNOSIS — Z5111 Encounter for antineoplastic chemotherapy: Secondary | ICD-10-CM

## 2012-12-21 DIAGNOSIS — Z5112 Encounter for antineoplastic immunotherapy: Secondary | ICD-10-CM

## 2012-12-21 DIAGNOSIS — D696 Thrombocytopenia, unspecified: Secondary | ICD-10-CM

## 2012-12-21 DIAGNOSIS — E876 Hypokalemia: Secondary | ICD-10-CM

## 2012-12-21 LAB — CBC WITH DIFFERENTIAL/PLATELET
BASO%: 0.5 % (ref 0.0–2.0)
EOS%: 4.8 % (ref 0.0–7.0)
LYMPH%: 11 % — ABNORMAL LOW (ref 14.0–49.0)
MCH: 30.9 pg (ref 27.2–33.4)
MCHC: 34.7 g/dL (ref 32.0–36.0)
MCV: 89 fL (ref 79.3–98.0)
MONO%: 7.2 % (ref 0.0–14.0)
NEUT#: 4.3 10*3/uL (ref 1.5–6.5)
Platelets: 125 10*3/uL — ABNORMAL LOW (ref 140–400)
RBC: 3.92 10*6/uL — ABNORMAL LOW (ref 4.20–5.82)
RDW: 14.5 % (ref 11.0–14.6)

## 2012-12-21 LAB — COMPREHENSIVE METABOLIC PANEL (CC13)
AST: 30 U/L (ref 5–34)
Albumin: 3 g/dL — ABNORMAL LOW (ref 3.5–5.0)
Alkaline Phosphatase: 75 U/L (ref 40–150)
Potassium: 4 mEq/L (ref 3.5–5.1)
Sodium: 139 mEq/L (ref 136–145)
Total Bilirubin: 0.3 mg/dL (ref 0.20–1.20)
Total Protein: 6.2 g/dL — ABNORMAL LOW (ref 6.4–8.3)

## 2012-12-21 LAB — POCT INR: INR: 2.8

## 2012-12-21 LAB — PROTIME-INR
INR: 2.8 (ref 2.00–3.50)
Protime: 33.6 Seconds — ABNORMAL HIGH (ref 10.6–13.4)

## 2012-12-21 MED ORDER — OXYCODONE HCL 5 MG PO TABS: 5.0000 mg | ORAL_TABLET | ORAL | Status: DC | PRN

## 2012-12-21 MED ORDER — SODIUM CHLORIDE 0.9 % IV SOLN
Freq: Once | INTRAVENOUS | Status: AC
  Administered 2012-12-21: 10:00:00 via INTRAVENOUS

## 2012-12-21 MED ORDER — DEXAMETHASONE SODIUM PHOSPHATE 20 MG/5ML IJ SOLN
20.0000 mg | Freq: Once | INTRAMUSCULAR | Status: AC
  Administered 2012-12-21: 20 mg via INTRAVENOUS

## 2012-12-21 MED ORDER — ATROPINE SULFATE 1 MG/ML IJ SOLN
0.5000 mg | Freq: Once | INTRAMUSCULAR | Status: AC | PRN
  Administered 2012-12-21: 0.5 mg via INTRAVENOUS

## 2012-12-21 MED ORDER — ONDANSETRON 16 MG/50ML IVPB (CHCC)
16.0000 mg | Freq: Once | INTRAVENOUS | Status: AC
  Administered 2012-12-21: 16 mg via INTRAVENOUS

## 2012-12-21 MED ORDER — FLUOROURACIL CHEMO INJECTION 2.5 GM/50ML
400.0000 mg/m2 | Freq: Once | INTRAVENOUS | Status: AC
  Administered 2012-12-21: 750 mg via INTRAVENOUS
  Filled 2012-12-21: qty 15

## 2012-12-21 MED ORDER — DEXTROSE 5 % IV SOLN
180.0000 mg/m2 | Freq: Once | INTRAVENOUS | Status: AC
  Administered 2012-12-21: 344 mg via INTRAVENOUS
  Filled 2012-12-21: qty 17.2

## 2012-12-21 MED ORDER — SODIUM CHLORIDE 0.9 % IV SOLN
6.0000 mg/kg | Freq: Once | INTRAVENOUS | Status: AC
  Administered 2012-12-21: 460 mg via INTRAVENOUS
  Filled 2012-12-21: qty 23

## 2012-12-21 MED ORDER — LEUCOVORIN CALCIUM INJECTION 350 MG
398.0000 mg/m2 | Freq: Once | INTRAVENOUS | Status: AC
  Administered 2012-12-21: 760 mg via INTRAVENOUS
  Filled 2012-12-21: qty 38

## 2012-12-21 MED ORDER — SODIUM CHLORIDE 0.9 % IV SOLN
2400.0000 mg/m2 | INTRAVENOUS | Status: DC
  Administered 2012-12-21: 4600 mg via INTRAVENOUS
  Filled 2012-12-21: qty 92

## 2012-12-21 NOTE — Progress Notes (Signed)
INR within goal today.  No problems to report regarding anticoagulation. Slight swelling in his left leg. It comes and goes. Pt seen by Dr. Truett Perna today. He is not hungry, but he eats. He continues biking on Sundays. He is not really drinking his shakes anymore. This is not a change for him. Slightly decrease coumadin dose to 4mg  daily except 2mg  on TuThuSat. Recheck INR in 2 weeks with next appointments on 01/04/13; lab at 10:30am, Dr. Truett Perna at 11am, treatment at 12pm and coumadin clinic at 12:15pm  Coumadin 4mg  samples provided: (x 10 tablets) Lot: [id number], Exp: 10/15

## 2012-12-21 NOTE — Telephone Encounter (Signed)
Per 8/6 pof f/u as scheduled. Pt given new schedule.

## 2012-12-21 NOTE — Progress Notes (Signed)
[facility]    OFFICE PROGRESS NOTE   INTERVAL HISTORY:   He returns as scheduled. He completed another cycle of FOLFIRI/panitumumab on 12/07/2012. No nausea/vomiting or diarrhea. The skin rash has improved. He continues to have cracking around the nail beds. Persistent neuropathy symptoms in the hands and feet. He has discomfort at the abdomen and lower back in the evening. He takes oxycodone at night. He continues to work and exercise.  Objective:  Vital signs in last 24 hours:  Blood pressure 130/84, pulse 65, temperature 97 F (36.1 C), temperature source Oral, resp. rate 18, height 5\' 9"  (1.753 m), weight 158 lb 8 oz (71.895 kg).    HEENT: No thrush or ulcers Resp: Lungs clear bilaterally Cardio: Regular rate and rhythm GI: No hepatomegaly, nontender Vascular: Trace edema at the left lower leg  Skin: Acne type rash over the face, chest, back, and extremities. Inflammation surrounding the nail beds   Portacath/PICC-without erythema  Lab Results:  Lab Results  Component Value Date   WBC 5.7 12/21/2012   HGB 12.1* 12/21/2012   HCT 34.9* 12/21/2012   MCV 89.0 12/21/2012   PLT 125* 12/21/2012   ANC 4.3  PT/INR 2.8  Medications: I have reviewed the patient's current medications.  Assessment/Plan: 1-Stage III (T4, N2) adenocarcinoma of the right colon, status post a right colectomy 11/25/2010. K-ras wild-type.  He began adjuvant FOLFOX chemotherapy 12/31/2010. He completed cycle 12 of FOLFOX on 06/03/2011.  #2-PET scan 12/29/2010 with hypermetabolic retroperitoneal and anterior abdominal adenopathy, postoperative changes of the right colectomy with a possible seroma in the anterior abdomen. Patchy activity was noted in the parotid glands, potentially inflammatory in etiology.  -The hypermetabolic retroperitoneal lymph nodes potentially represented metastatic disease. We presented his case at the GI tumor conference and discussed the case with Dr. Dwain Sarna. A  decision was made to proceed with "adjuvant" therapy with the plan for a restaging CT at a 3 to four-month interval.  -The restaging CT on 03/23/2011 revealed a decrease in the periaortic lymphadenopathy, no new adenopathy, and no evidence of liver metastases. This suggests the small retroperitoneal lymph nodes may represent metastatic colon cancer versus resolving "inflammatory" nodes.  -Restaging CT 07/13/2011 revealed increased retroperitoneal lymphadenopathy consistent with metastatic disease.  -Restaging PET scan 07/22/2011 confirmed persistent hypermetabolic retroperitoneal lymph nodes. -Restaging CT 11/12/2011 with a slight increase in the size of retroperitoneal/mesenteric lymph nodes, no other evidence of progressive metastatic disease  -Restaging CT 01/27/2012 with a slight increase in the size of a necrotic celiac node, slight decrease in the para-aortic nodes  -Status post palliative radiation to the abdominal/retroperitoneal lymph nodes completed on 04/04/2012  -Restaging CT 05/31/2012 with a slight decrease in the size of abdominal lymph nodes and a stable mass adjacent to the SMA,? New left lung lesion  -Restaging CT 07/29/2012 with new mediastinal/hilar nodes and a new left lingula nodule. New retrocrural and retroperitoneal nodes.  -initiation of FOLFIRI/panitumumab on an every 2 week schedule 08/17/2012.  -restaging CT 10/19/2012 after 5 cycles of FOLFIRI/panitumumab with improvement in mediastinal adenopathy, a left lingular nodule, retroperitoneal nodes, and a stable node adjacent to the spleen.  -Cycle 7 FOLFIRI/Panitumumab 11/09/2012.  -Cycle 8 FOLFIRI/Panitumumab 11/23/2012.  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 A basal cell carcinoma was recently  removed  from a right finger by Dr. Karlyn Agee.  #6-Left leg deep vein thrombosis confirmed on a Doppler ultrasound 01/28/2011, now maintained on Coumadin.  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 He has persistent mild numbness in the fingertips and toes. The numbness does not interfere with activity.  #8-Mild neutropenia on 03/11/2011, he received Neulasta following cycle #6 of FOLFOX. He also received Neulasta support following cycle 8, cycle 10 and cycle #12.  #9-Thrombocytopenia secondary to chemotherapy. He has mild thrombocytopenia on labs today.  #10-Low back pain-most likely related to the necrotic celiac node. He completed palliative radiation on 04/04/2012. The pain resolved. He recently presented with progressive back pain-likely secondary to progression of retrocrural/retroperitoneal adenopathy. The low back pain has resolved.  #11-Fever/rash on 07/29/2012-? contrast reaction.  #12-Clinical and CT evidence of a fractured Port-A-Cath 07/29/2012. The Port-A-Cath was removed on 08/04/2012. There was a linear laceration in the line. New Port-A-Cath placed on 08/15/2012.  #13-skin rash secondary to panitumumab-he will continue minocycline. The rash is stable.  #14-abdominal pain beginning approximately 1 week after treatment occurring with cycles 7 and 8 FOLFIRI/Panitumumab, persistent #15-hypokalemia, maintained on a potassium supplement   Disposition:  He appears stable. He will complete cycle 10 of FOLFIRI/panitumumab today. He is scheduled for a restaging CT evaluation prior to an office visit on 01/04/2013.   Thornton Papas, MD  12/21/2012  10:03 AM

## 2012-12-21 NOTE — Patient Instructions (Addendum)
[facility] Discharge Instructions for Patients Receiving Chemotherapy  Today you received the following chemotherapy agents Vectibix/Camptosar/Leucovorin/5FU.  To help prevent nausea and vomiting after your treatment, we encourage you to take your nausea medication as prescribed.   If you develop nausea and vomiting that is not controlled by your nausea medication, call the clinic.   BELOW ARE SYMPTOMS THAT SHOULD BE REPORTED IMMEDIATELY:  *FEVER GREATER THAN 100.5 F  *CHILLS WITH OR WITHOUT FEVER  NAUSEA AND VOMITING THAT IS NOT CONTROLLED WITH YOUR NAUSEA MEDICATION  *UNUSUAL SHORTNESS OF BREATH  *UNUSUAL BRUISING OR BLEEDING  TENDERNESS IN MOUTH AND THROAT WITH OR WITHOUT PRESENCE OF ULCERS  *URINARY PROBLEMS  *BOWEL PROBLEMS  UNUSUAL RASH Items with * indicate a potential emergency and should be followed up as soon as possible.  Feel free to call the clinic you have any questions or concerns. The clinic phone number is [phone].

## 2012-12-21 NOTE — Patient Instructions (Addendum)
Slightly decrease your coumadin dose to 4mg  daily except 2mg  on TuThuSat. Recheck INR in 2 weeks with next appointments on 01/04/13; lab at 10:30am, Dr. Truett Perna at 11am, treatment at 12pm and coumadin clinic at 12:15pm.   We will see you in infusion room.

## 2012-12-23 ENCOUNTER — Ambulatory Visit (HOSPITAL_BASED_OUTPATIENT_CLINIC_OR_DEPARTMENT_OTHER): Payer: BC Managed Care – PPO

## 2012-12-23 VITALS — BP 141/94 | HR 64 | Temp 97.1°F | Resp 14

## 2012-12-23 DIAGNOSIS — Z452 Encounter for adjustment and management of vascular access device: Secondary | ICD-10-CM

## 2012-12-23 DIAGNOSIS — C189 Malignant neoplasm of colon, unspecified: Secondary | ICD-10-CM

## 2012-12-23 MED ORDER — HEPARIN SOD (PORK) LOCK FLUSH 100 UNIT/ML IV SOLN
500.0000 [IU] | Freq: Once | INTRAVENOUS | Status: AC | PRN
  Administered 2012-12-23: 500 [IU]
  Filled 2012-12-23: qty 5

## 2012-12-23 MED ORDER — SODIUM CHLORIDE 0.9 % IJ SOLN
10.0000 mL | INTRAMUSCULAR | Status: DC | PRN
  Administered 2012-12-23: 10 mL
  Filled 2012-12-23: qty 10

## 2012-12-31 ENCOUNTER — Other Ambulatory Visit: Payer: Self-pay | Admitting: Oncology

## 2013-01-02 ENCOUNTER — Encounter (HOSPITAL_COMMUNITY): Payer: Self-pay

## 2013-01-02 ENCOUNTER — Ambulatory Visit (HOSPITAL_COMMUNITY): Payer: BC Managed Care – PPO

## 2013-01-02 ENCOUNTER — Ambulatory Visit (HOSPITAL_COMMUNITY)
Admission: RE | Admit: 2013-01-02 | Discharge: 2013-01-02 | Disposition: A | Discharge status: Home / Self Care | Payer: BC Managed Care – PPO | Source: Ambulatory Visit | Attending: Nurse Practitioner | Admitting: Nurse Practitioner

## 2013-01-02 DIAGNOSIS — R599 Enlarged lymph nodes, unspecified: Secondary | ICD-10-CM | POA: Insufficient documentation

## 2013-01-02 DIAGNOSIS — I708 Atherosclerosis of other arteries: Secondary | ICD-10-CM | POA: Insufficient documentation

## 2013-01-02 DIAGNOSIS — N4289 Other specified disorders of prostate: Secondary | ICD-10-CM | POA: Insufficient documentation

## 2013-01-02 DIAGNOSIS — I2510 Atherosclerotic heart disease of native coronary artery without angina pectoris: Secondary | ICD-10-CM | POA: Insufficient documentation

## 2013-01-02 DIAGNOSIS — C189 Malignant neoplasm of colon, unspecified: Secondary | ICD-10-CM

## 2013-01-02 DIAGNOSIS — I700 Atherosclerosis of aorta: Secondary | ICD-10-CM | POA: Insufficient documentation

## 2013-01-02 DIAGNOSIS — R911 Solitary pulmonary nodule: Secondary | ICD-10-CM | POA: Insufficient documentation

## 2013-01-02 DIAGNOSIS — Z79899 Other long term (current) drug therapy: Secondary | ICD-10-CM | POA: Insufficient documentation

## 2013-01-02 MED ORDER — IOHEXOL 300 MG/ML  SOLN
100.0000 mL | Freq: Once | INTRAMUSCULAR | Status: AC | PRN
  Administered 2013-01-02: 100 mL via INTRAVENOUS

## 2013-01-04 ENCOUNTER — Ambulatory Visit (HOSPITAL_BASED_OUTPATIENT_CLINIC_OR_DEPARTMENT_OTHER): Payer: BC Managed Care – PPO | Admitting: Pharmacist

## 2013-01-04 ENCOUNTER — Ambulatory Visit: Payer: BC Managed Care – PPO

## 2013-01-04 ENCOUNTER — Telehealth: Payer: Self-pay | Admitting: *Deleted

## 2013-01-04 ENCOUNTER — Telehealth: Payer: Self-pay

## 2013-01-04 ENCOUNTER — Other Ambulatory Visit (HOSPITAL_BASED_OUTPATIENT_CLINIC_OR_DEPARTMENT_OTHER): Payer: BC Managed Care – PPO

## 2013-01-04 ENCOUNTER — Ambulatory Visit (HOSPITAL_BASED_OUTPATIENT_CLINIC_OR_DEPARTMENT_OTHER): Payer: BC Managed Care – PPO | Admitting: Oncology

## 2013-01-04 VITALS — BP 125/83 | HR 64 | Temp 98.2°F | Resp 18 | Ht 69.0 in | Wt 155.8 lb

## 2013-01-04 DIAGNOSIS — D696 Thrombocytopenia, unspecified: Secondary | ICD-10-CM

## 2013-01-04 DIAGNOSIS — E876 Hypokalemia: Secondary | ICD-10-CM

## 2013-01-04 DIAGNOSIS — C189 Malignant neoplasm of colon, unspecified: Secondary | ICD-10-CM

## 2013-01-04 DIAGNOSIS — C182 Malignant neoplasm of ascending colon: Secondary | ICD-10-CM

## 2013-01-04 DIAGNOSIS — Z86718 Personal history of other venous thrombosis and embolism: Secondary | ICD-10-CM

## 2013-01-04 DIAGNOSIS — I82402 Acute embolism and thrombosis of unspecified deep veins of left lower extremity: Secondary | ICD-10-CM

## 2013-01-04 DIAGNOSIS — Z1509 Genetic susceptibility to other malignant neoplasm: Secondary | ICD-10-CM

## 2013-01-04 DIAGNOSIS — R109 Unspecified abdominal pain: Secondary | ICD-10-CM

## 2013-01-04 DIAGNOSIS — C778 Secondary and unspecified malignant neoplasm of lymph nodes of multiple regions: Secondary | ICD-10-CM

## 2013-01-04 DIAGNOSIS — R21 Rash and other nonspecific skin eruption: Secondary | ICD-10-CM

## 2013-01-04 LAB — CBC WITH DIFFERENTIAL/PLATELET
Basophils Absolute: 0.1 10*3/uL (ref 0.0–0.1)
EOS%: 5.9 % (ref 0.0–7.0)
Eosinophils Absolute: 0.2 10*3/uL (ref 0.0–0.5)
HCT: 34.4 % — ABNORMAL LOW (ref 38.4–49.9)
HGB: 11.8 g/dL — ABNORMAL LOW (ref 13.0–17.1)
MCH: 30.9 pg (ref 27.2–33.4)
MCV: 90.1 fL (ref 79.3–98.0)
MONO%: 9.2 % (ref 0.0–14.0)
NEUT#: 2.6 10*3/uL (ref 1.5–6.5)
NEUT%: 65.8 % (ref 39.0–75.0)
Platelets: 130 10*3/uL — ABNORMAL LOW (ref 140–400)
RDW: 14.1 % (ref 11.0–14.6)

## 2013-01-04 LAB — PROTIME-INR
INR: 2.3 (ref 2.00–3.50)
Protime: 27.6 Seconds — ABNORMAL HIGH (ref 10.6–13.4)

## 2013-01-04 LAB — POCT INR: INR: 2.3

## 2013-01-04 MED ORDER — OXYCODONE HCL 5 MG PO TABS: 5.0000 mg | ORAL_TABLET | ORAL | Status: DC | PRN

## 2013-01-04 NOTE — Telephone Encounter (Signed)
gv and pritned appt sched and avs forpt...emailed MW to add tx.Marland KitchenMarland KitchenMarland KitchenPt sched to see Dr. Christella Hartigan on 9.24.@ 2:30pm

## 2013-01-04 NOTE — Progress Notes (Signed)
[facility]    OFFICE PROGRESS NOTE   INTERVAL HISTORY:   He returns as scheduled. He completed another cycle of FOLFIRI/panitumumab on 12/21/2012. He reports tongue soreness following chemotherapy. No nausea or diarrhea. He now has consistent bilateral abdominal/lateral chest discomfort. No significant back pain. He notes the pain is improved for several days after each cycle of chemotherapy. The skin rash persists. His bowel movements are now irregular, but he does not have constipation.  Objective:  Vital signs in last 24 hours:  Blood pressure 125/83, pulse 64, temperature 98.2 F (36.8 C), temperature source Oral, resp. rate 18, height 5\' 9"  (1.753 m), weight 155 lb 12.8 oz (70.67 kg).    HEENT: No thrush or ulcers Resp: Lungs clear bilaterally Cardio: Regular rate and rhythm GI: Nontender, no hepatomegaly, no apparent ascites, no mass Vascular: Trace edema at the left lower leg  Skin: Acne type rash over the face, trunk, and extremities   Portacath/PICC-without erythema  Lab Results:  Lab Results  Component Value Date   WBC 3.9* 01/04/2013   HGB 11.8* 01/04/2013   HCT 34.4* 01/04/2013   MCV 90.1 01/04/2013   PLT 130* 01/04/2013   ANC 2.6  PT/INR 2.3  X-rays: Restaging CT 18 2014-A lingular nodule appears slightly smaller measuring approximately 4 mm. No new or enlarging pulmonary nodules., Ill-defined nodal mass posterior to the splenic vein and pancreas is unchanged. Smaller celiac and left periaortic nodes are unchanged. No progressive lymphadenopathy. No ascites or peritoneal nodularity. The liver is unremarkable. No evidence of bowel obstruction.    Medications: I have reviewed the patient's current medications.  Assessment/Plan: 1-Stage III (T4, N2) adenocarcinoma of the right colon, status post a right colectomy 11/25/2010. K-ras wild-type.  He began adjuvant FOLFOX chemotherapy 12/31/2010. He completed cycle 12 of FOLFOX on 06/03/2011.    #2-PET scan 12/29/2010 with hypermetabolic retroperitoneal and anterior abdominal adenopathy, postoperative changes of the right colectomy with a possible seroma in the anterior abdomen. Patchy activity was noted in the parotid glands, potentially inflammatory in etiology.  -The hypermetabolic retroperitoneal lymph nodes potentially represented metastatic disease. We presented his case at the GI tumor conference and discussed the case with Dr. Dwain Sarna. A decision was made to proceed with "adjuvant" therapy with the plan for a restaging CT at a 3 to four-month interval.  -The restaging CT on 03/23/2011 revealed a decrease in the periaortic lymphadenopathy, no new adenopathy, and no evidence of liver metastases. This suggests the small retroperitoneal lymph nodes may represent metastatic colon cancer versus resolving "inflammatory" nodes.  -Restaging CT 07/13/2011 revealed increased retroperitoneal lymphadenopathy consistent with metastatic disease.  -Restaging PET scan 07/22/2011 confirmed persistent hypermetabolic retroperitoneal lymph nodes. -Restaging CT 11/12/2011 with a slight increase in the size of retroperitoneal/mesenteric lymph nodes, no other evidence of progressive metastatic disease  -Restaging CT 01/27/2012 with a slight increase in the size of a necrotic celiac node, slight decrease in the para-aortic nodes  -Status post palliative radiation to the abdominal/retroperitoneal lymph nodes completed on 04/04/2012  -Restaging CT 05/31/2012 with a slight decrease in the size of abdominal lymph nodes and a stable mass adjacent to the SMA,? New left lung lesion  -Restaging CT 07/29/2012 with new mediastinal/hilar nodes and a new left lingula nodule. New retrocrural and retroperitoneal nodes.  -initiation of FOLFIRI/panitumumab on an every 2 week schedule 08/17/2012.  -restaging CT 10/19/2012 after 5 cycles of FOLFIRI/panitumumab with improvement in mediastinal adenopathy, a left lingular nodule,  retroperitoneal nodes, and a stable node adjacent  to the spleen.  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 A basal cell carcinoma was recently removed from a right finger by Dr. Karlyn Agee.  #6-Left leg deep vein thrombosis confirmed on a Doppler ultrasound 01/28/2011, now maintained on Coumadin.  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 He has persistent mild numbness in the fingertips and toes. The numbness does not interfere with activity.  #8-Mild neutropenia on 03/11/2011, he received Neulasta following cycle #6 of FOLFOX. He also received Neulasta support following cycle 8, cycle 10 and cycle #12.  #9-Thrombocytopenia secondary to chemotherapy. He has mild thrombocytopenia on labs today.  #10-Low back pain-most likely related to the necrotic celiac node. He completed palliative radiation on 04/04/2012. The pain resolved. He recently presented with progressive back pain-likely secondary to progression of retrocrural/retroperitoneal adenopathy. The low back pain has resolved.  #11-Fever/rash on 07/29/2012-? contrast reaction.  #12-Clinical and CT evidence of a fractured Port-A-Cath 07/29/2012. The Port-A-Cath was removed on 08/04/2012. There was a linear laceration in the line. New Port-A-Cath placed on 08/15/2012.  #13-skin rash secondary to panitumumab-he will continue minocycline. The rash is stable.  #14-abdominal pain beginning approximately 1 week after treatment occurring with cycles 7 and 8 FOLFIRI/Panitumumab, now persistent  #15-hypokalemia, maintained on a potassium supplement    Disposition:  The restaging CT reveals stable disease, but he has developed  consistent abdominal pain. The pain improves for a few days after each cycle of chemotherapy,? Effect of Decadron. I discussed the restaging CT in pain with Mr.Mcmullen and his wife. The pain is most likely related to retroperitoneal lymphadenopathy. It is possible the pain is related to adhesions, and early bowel obstruction, or tumor not recognized on the CT. I have a low clinical suspicion for spine metastases.  We decided to place the FOLFIRI/panitumumab on hold. He will be referred to Dr. Christella Hartigan to consider the indication for a celiac block. The Coumadin will need to be held if a procedure is planned.  He will return for an office visit in 2 weeks.   Thornton Papas, MD  01/04/2013  1:02 PM

## 2013-01-04 NOTE — Progress Notes (Signed)
INR = 2.3 on 4 mg daily; 2 mg Tu/Th/Sat No complications re: anticoag. Chemo held today. Dr. Truett Perna has recommended pt take Ibuprofen PRN abdominal pain.  I informed pt this may cause increase risk of bleeding +/- increase in INR.  Pt will monitor for sxs of bleeding but for now since his INR is stable, I will not change his Coumadin dose. Pt stated he had enough Coumadin samples at home & did not need more at this time. Repeat protime 01/18/13 (2 weeks). Ebony Hail, Pharm.D., CPP 01/04/2013@12 :31 PM

## 2013-01-04 NOTE — Telephone Encounter (Signed)
Per staff message and POF I have scheduled appts.  JMW  

## 2013-01-07 ENCOUNTER — Telehealth: Payer: Self-pay | Admitting: Gastroenterology

## 2013-01-07 DIAGNOSIS — R109 Unspecified abdominal pain: Secondary | ICD-10-CM

## 2013-01-07 DIAGNOSIS — C189 Malignant neoplasm of colon, unspecified: Secondary | ICD-10-CM

## 2013-01-07 NOTE — Telephone Encounter (Signed)
He needs upper EUS, linear only, 45 min, for metastatic colon cancer, abd pain, +MAC, +celiac plexus neurolysis.  This upcoming week sometime.

## 2013-01-09 ENCOUNTER — Encounter: Payer: Self-pay | Admitting: Gastroenterology

## 2013-01-09 ENCOUNTER — Telehealth: Payer: Self-pay

## 2013-01-09 ENCOUNTER — Telehealth: Payer: Self-pay | Admitting: *Deleted

## 2013-01-09 ENCOUNTER — Other Ambulatory Visit: Payer: Self-pay

## 2013-01-09 DIAGNOSIS — C189 Malignant neoplasm of colon, unspecified: Secondary | ICD-10-CM

## 2013-01-09 DIAGNOSIS — R109 Unspecified abdominal pain: Secondary | ICD-10-CM

## 2013-01-09 NOTE — Telephone Encounter (Signed)
EUS scheduled, pt instructed and medications reviewed.  Patient instructions mailed to home.  Patient to call with any questions or concerns.  I will wait for a response from Dr Truett Perna regarding coumadin pt will hold coumadin tomorrow and until after the procedure per Dr Christella Hartigan.

## 2013-01-09 NOTE — Telephone Encounter (Signed)
Needs to come off coumadin starting today.  Can you call his PCP to make sure that is safe.  thanks

## 2013-01-09 NOTE — Telephone Encounter (Signed)
Pt has been scheduled for 01/12/13 1115 am Dr Christella Hartigan pt is on coumadin, should he stay on?

## 2013-01-09 NOTE — Telephone Encounter (Signed)
Message copied by Donata Duff on Mon Jan 09, 2013  8:40 AM ------      Message from: Rob Bunting P      Created: Mon Jan 09, 2013  7:28 AM                   ----- Message -----         From: Ladene Artist, MD         Sent: 01/07/2013   3:54 PM           To: Rachael Fee, MD            thanks      ----- Message -----         From: Rachael Fee, MD         Sent: 01/06/2013   7:35 AM           To: Ladene Artist, MD            Brad,      Given the tumor location, I think he'll very likely benefit from celiac block. We will set it up.            Cyndie Woodbeck,      HE needs upper EUS, radial only 45 min, ++MAC sedation for celiac plexus neurolysis. He is on coumadin, will need to hold for 5 days (will need OK from his PCP, on the med for DVT).  Next available EUS Thursday.            dj                        ----- Message -----         From: Ladene Artist, MD         Sent: 01/04/2013   1:27 PM           To: Rachael Fee, MD            He now has consistent abdominal pain, Will he benefit from a celiac block?            Thanks, Nida Boatman             ------

## 2013-01-09 NOTE — Telephone Encounter (Signed)
Pt is not followed by Dr Abigail Miyamoto, his coumadin has been managed by Dr Truett Perna and per Dr Truett Perna note he can hold coumadin prior to procedure.  I have left a message to confirm with Dr Truett Perna.  Messages have been left for the pt as well.  Dr Christella Hartigan states that is it ok if the pt has taken coumadin today but should not take it tomorrow

## 2013-01-09 NOTE — Telephone Encounter (Signed)
Letter sent to Dr Truett Perna

## 2013-01-09 NOTE — Telephone Encounter (Signed)
See alternate phone note  

## 2013-01-09 NOTE — Telephone Encounter (Signed)
Per Dr. Truett Perna : OK to hold Coumadin beginning today and needs to resume Thursday pm after his EUG. Does not need Lovenox bridge.

## 2013-01-09 NOTE — Telephone Encounter (Signed)
Call has been placed to Dr Abigail Miyamoto message taken for a call back

## 2013-01-10 ENCOUNTER — Encounter (HOSPITAL_COMMUNITY): Payer: Self-pay | Admitting: Pharmacy Technician

## 2013-01-10 ENCOUNTER — Encounter (HOSPITAL_COMMUNITY): Payer: Self-pay | Admitting: *Deleted

## 2013-01-12 ENCOUNTER — Encounter (HOSPITAL_COMMUNITY): Payer: Self-pay | Admitting: Gastroenterology

## 2013-01-12 ENCOUNTER — Ambulatory Visit (HOSPITAL_COMMUNITY): Payer: BC Managed Care – PPO | Admitting: Anesthesiology

## 2013-01-12 ENCOUNTER — Encounter (HOSPITAL_COMMUNITY)
Admission: RE | Disposition: A | Discharge status: Home / Self Care | Payer: Self-pay | Source: Ambulatory Visit | Attending: Gastroenterology

## 2013-01-12 ENCOUNTER — Encounter (HOSPITAL_COMMUNITY): Payer: Self-pay | Admitting: Anesthesiology

## 2013-01-12 ENCOUNTER — Ambulatory Visit (HOSPITAL_COMMUNITY)
Admission: RE | Admit: 2013-01-12 | Discharge: 2013-01-12 | Disposition: A | Discharge status: Home / Self Care | Payer: BC Managed Care – PPO | Source: Ambulatory Visit | Attending: Gastroenterology | Admitting: Gastroenterology

## 2013-01-12 DIAGNOSIS — R109 Unspecified abdominal pain: Secondary | ICD-10-CM

## 2013-01-12 DIAGNOSIS — I739 Peripheral vascular disease, unspecified: Secondary | ICD-10-CM | POA: Insufficient documentation

## 2013-01-12 DIAGNOSIS — C189 Malignant neoplasm of colon, unspecified: Secondary | ICD-10-CM

## 2013-01-12 DIAGNOSIS — Z7901 Long term (current) use of anticoagulants: Secondary | ICD-10-CM | POA: Insufficient documentation

## 2013-01-12 DIAGNOSIS — C182 Malignant neoplasm of ascending colon: Secondary | ICD-10-CM | POA: Insufficient documentation

## 2013-01-12 DIAGNOSIS — Z86718 Personal history of other venous thrombosis and embolism: Secondary | ICD-10-CM | POA: Insufficient documentation

## 2013-01-12 DIAGNOSIS — Z923 Personal history of irradiation: Secondary | ICD-10-CM | POA: Insufficient documentation

## 2013-01-12 DIAGNOSIS — C801 Malignant (primary) neoplasm, unspecified: Secondary | ICD-10-CM | POA: Insufficient documentation

## 2013-01-12 DIAGNOSIS — Z9049 Acquired absence of other specified parts of digestive tract: Secondary | ICD-10-CM | POA: Insufficient documentation

## 2013-01-12 HISTORY — DX: Personal history of antineoplastic chemotherapy: Z92.21

## 2013-01-12 HISTORY — PX: EUS: SHX5427

## 2013-01-12 SURGERY — UPPER ENDOSCOPIC ULTRASOUND (EUS) LINEAR
Anesthesia: Monitor Anesthesia Care

## 2013-01-12 MED ORDER — HEPARIN SOD (PORK) LOCK FLUSH 100 UNIT/ML IV SOLN
INTRAVENOUS | Status: AC
  Filled 2013-01-12: qty 5

## 2013-01-12 MED ORDER — FENTANYL CITRATE 0.05 MG/ML IJ SOLN
INTRAMUSCULAR | Status: AC
  Filled 2013-01-12: qty 2

## 2013-01-12 MED ORDER — SODIUM CHLORIDE 0.9 % IV SOLN: INTRAVENOUS | Status: DC

## 2013-01-12 MED ORDER — KETAMINE HCL 10 MG/ML IJ SOLN
INTRAMUSCULAR | Status: DC | PRN
  Administered 2013-01-12: 10 mg via INTRAVENOUS

## 2013-01-12 MED ORDER — ALCOHOL 98 % IV SOLN
20.0000 mL | Freq: Once | INTRAVENOUS | Status: DC
  Filled 2013-01-12: qty 20

## 2013-01-12 MED ORDER — PROPOFOL INFUSION 10 MG/ML OPTIME
INTRAVENOUS | Status: DC | PRN
  Administered 2013-01-12: 100 ug/kg/min via INTRAVENOUS

## 2013-01-12 MED ORDER — BUPIVACAINE HCL (PF) 0.25 % IJ SOLN
INTRAMUSCULAR | Status: AC
  Filled 2013-01-12: qty 30

## 2013-01-12 MED ORDER — FENTANYL CITRATE 0.05 MG/ML IJ SOLN
INTRAMUSCULAR | Status: DC | PRN
  Administered 2013-01-12: 25 ug via INTRAVENOUS
  Administered 2013-01-12: 75 ug via INTRAVENOUS

## 2013-01-12 MED ORDER — LACTATED RINGERS IV SOLN
INTRAVENOUS | Status: DC | PRN
  Administered 2013-01-12: 09:00:00 via INTRAVENOUS

## 2013-01-12 MED ORDER — MIDAZOLAM HCL 5 MG/5ML IJ SOLN
INTRAMUSCULAR | Status: DC | PRN
  Administered 2013-01-12: 2 mg via INTRAVENOUS

## 2013-01-12 MED ORDER — LACTATED RINGERS IV SOLN
INTRAVENOUS | Status: DC | PRN
  Administered 2013-01-12: 11:00:00 via INTRAVENOUS

## 2013-01-12 MED ORDER — ONDANSETRON HCL 4 MG/2ML IJ SOLN
INTRAMUSCULAR | Status: DC | PRN
  Administered 2013-01-12: 2 mg via INTRAVENOUS

## 2013-01-12 NOTE — Interval H&P Note (Signed)
History and Physical Interval Note:  01/12/2013 9:53 AM  Terry Vargas  has presented today for surgery, with the diagnosis of Colon cancer [153.9] Abdominal pain [789.00]  The various methods of treatment have been discussed with the patient and family. After consideration of risks, benefits and other options for treatment, the patient has consented to  Procedure(s) with comments: UPPER ENDOSCOPIC ULTRASOUND (EUS) LINEAR (N/A) - celiac plexus  as a surgical intervention .  The patient's history has been reviewed, patient examined, no change in status, stable for surgery.  I have reviewed the patient's chart and labs.  Questions were answered to the patient's satisfaction.     Rob Bunting

## 2013-01-12 NOTE — Anesthesia Postprocedure Evaluation (Signed)
Anesthesia Post Note  Patient: Terry Vargas  Procedure(s) Performed: Procedure(s) (LRB): UPPER ENDOSCOPIC ULTRASOUND (EUS) LINEAR (N/A)  Anesthesia type: MAC  Patient location: PACU  Post pain: Pain level controlled  Post assessment: Post-op Vital signs reviewed  Last Vitals: BP 143/85  Pulse 55  Temp(Src) 36.8 C (Oral)  Resp 18  SpO2 100%  Post vital signs: Reviewed  Level of consciousness: awake  Complications: No apparent anesthesia complications

## 2013-01-12 NOTE — Transfer of Care (Signed)
Immediate Anesthesia Transfer of Care Note  Patient: Terry Vargas  Procedure(s) Performed: Procedure(s) with comments: UPPER ENDOSCOPIC ULTRASOUND (EUS) LINEAR (N/A) - celiac plexus   Patient Location: PACU and Endoscopy Unit  Anesthesia Type:MAC  Level of Consciousness: awake, oriented, patient cooperative, lethargic and responds to stimulation  Airway & Oxygen Therapy: Patient Spontanous Breathing and Patient connected to nasal cannula oxygen  Post-op Assessment: Report given to PACU RN, Post -op Vital signs reviewed and stable and Patient moving all extremities  Post vital signs: Reviewed and stable  Complications: No apparent anesthesia complications

## 2013-01-12 NOTE — H&P (View-Only) (Signed)
 [facility]    OFFICE PROGRESS NOTE   INTERVAL HISTORY:   He returns as scheduled. He completed another cycle of FOLFIRI/panitumumab on 12/21/2012. He reports tongue soreness following chemotherapy. No nausea or diarrhea. He now has consistent bilateral abdominal/lateral chest discomfort. No significant back pain. He notes the pain is improved for several days after each cycle of chemotherapy. The skin rash persists. His bowel movements are now irregular, but he does not have constipation.  Objective:  Vital signs in last 24 hours:  Blood pressure 125/83, pulse 64, temperature 98.2 F (36.8 C), temperature source Oral, resp. rate 18, height 5' 9" (1.753 m), weight 155 lb 12.8 oz (70.67 kg).    HEENT: No thrush or ulcers Resp: Lungs clear bilaterally Cardio: Regular rate and rhythm GI: Nontender, no hepatomegaly, no apparent ascites, no mass Vascular: Trace edema at the left lower leg  Skin: Acne type rash over the face, trunk, and extremities   Portacath/PICC-without erythema  Lab Results:  Lab Results  Component Value Date   WBC 3.9* 01/04/2013   HGB 11.8* 01/04/2013   HCT 34.4* 01/04/2013   MCV 90.1 01/04/2013   PLT 130* 01/04/2013   ANC 2.6  PT/INR 2.3  X-rays: Restaging CT 18 2014-A lingular nodule appears slightly smaller measuring approximately 4 mm. No new or enlarging pulmonary nodules., Ill-defined nodal mass posterior to the splenic vein and pancreas is unchanged. Smaller celiac and left periaortic nodes are unchanged. No progressive lymphadenopathy. No ascites or peritoneal nodularity. The liver is unremarkable. No evidence of bowel obstruction.    Medications: I have reviewed the patient's current medications.  Assessment/Plan: 1-Stage III (T4, N2) adenocarcinoma of the right colon, status post a right colectomy 11/25/2010. K-ras wild-type.  He began adjuvant FOLFOX chemotherapy 12/31/2010. He completed cycle 12 of FOLFOX on 06/03/2011.    #2-PET scan 12/29/2010 with hypermetabolic retroperitoneal and anterior abdominal adenopathy, postoperative changes of the right colectomy with a possible seroma in the anterior abdomen. Patchy activity was noted in the parotid glands, potentially inflammatory in etiology.  -The hypermetabolic retroperitoneal lymph nodes potentially represented metastatic disease. We presented his case at the GI tumor conference and discussed the case with Dr. Wakefield. A decision was made to proceed with "adjuvant" therapy with the plan for a restaging CT at a 3 to four-month interval.  -The restaging CT on 03/23/2011 revealed a decrease in the periaortic lymphadenopathy, no new adenopathy, and no evidence of liver metastases. This suggests the small retroperitoneal lymph nodes may represent metastatic colon cancer versus resolving "inflammatory" nodes.  -Restaging CT 07/13/2011 revealed increased retroperitoneal lymphadenopathy consistent with metastatic disease.  -Restaging PET scan 07/22/2011 confirmed persistent hypermetabolic retroperitoneal lymph nodes. -Restaging CT 11/12/2011 with a slight increase in the size of retroperitoneal/mesenteric lymph nodes, no other evidence of progressive metastatic disease  -Restaging CT 01/27/2012 with a slight increase in the size of a necrotic celiac node, slight decrease in the para-aortic nodes  -Status post palliative radiation to the abdominal/retroperitoneal lymph nodes completed on 04/04/2012  -Restaging CT 05/31/2012 with a slight decrease in the size of abdominal lymph nodes and a stable mass adjacent to the SMA,? New left lung lesion  -Restaging CT 07/29/2012 with new mediastinal/hilar nodes and a new left lingula nodule. New retrocrural and retroperitoneal nodes.  -initiation of FOLFIRI/panitumumab on an every 2 week schedule 08/17/2012.  -restaging CT 10/19/2012 after 5 cycles of FOLFIRI/panitumumab with improvement in mediastinal adenopathy, a left lingular nodule,  retroperitoneal nodes, and a stable node adjacent   to the spleen.  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 A basal cell carcinoma was recently removed from a right finger by Dr. Dan Jones.  #6-Left leg deep vein thrombosis confirmed on a Doppler ultrasound 01/28/2011, now maintained on Coumadin.  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 He has persistent mild numbness in the fingertips and toes. The numbness does not interfere with activity.  #8-Mild neutropenia on 03/11/2011, he received Neulasta following cycle #6 of FOLFOX. He also received Neulasta support following cycle 8, cycle 10 and cycle #12.  #9-Thrombocytopenia secondary to chemotherapy. He has mild thrombocytopenia on labs today.  #10-Low back pain-most likely related to the necrotic celiac node. He completed palliative radiation on 04/04/2012. The pain resolved. He recently presented with progressive back pain-likely secondary to progression of retrocrural/retroperitoneal adenopathy. The low back pain has resolved.  #11-Fever/rash on 07/29/2012-? contrast reaction.  #12-Clinical and CT evidence of a fractured Port-A-Cath 07/29/2012. The Port-A-Cath was removed on 08/04/2012. There was a linear laceration in the line. New Port-A-Cath placed on 08/15/2012.  #13-skin rash secondary to panitumumab-he will continue minocycline. The rash is stable.  #14-abdominal pain beginning approximately 1 week after treatment occurring with cycles 7 and 8 FOLFIRI/Panitumumab, now persistent  #15-hypokalemia, maintained on a potassium supplement    Disposition:  The restaging CT reveals stable disease, but he has developed  consistent abdominal pain. The pain improves for a few days after each cycle of chemotherapy,? Effect of Decadron. I discussed the restaging CT in pain with Mr.Halbig and his wife. The pain is most likely related to retroperitoneal lymphadenopathy. It is possible the pain is related to adhesions, and early bowel obstruction, or tumor not recognized on the CT. I have a low clinical suspicion for spine metastases.  We decided to place the FOLFIRI/panitumumab on hold. He will be referred to Dr. Jacobs to consider the indication for a celiac block. The Coumadin will need to be held if a procedure is planned.  He will return for an office visit in 2 weeks.   Meta Kroenke, MD  01/04/2013  1:02 PM    

## 2013-01-12 NOTE — Anesthesia Preprocedure Evaluation (Addendum)
Anesthesia Evaluation  Patient identified by MRN, date of birth, ID band Patient awake    Reviewed: Allergy & Precautions, H&P , NPO status , Patient's Chart, lab work & pertinent test results  History of Anesthesia Complications Negative for: history of anesthetic complications  Airway Mallampati: I TM Distance: >3 FB Neck ROM: Full    Dental  (+) Teeth Intact, Caps and Dental Advisory Given   Pulmonary neg pulmonary ROS,  breath sounds clear to auscultation  Pulmonary exam normal       Cardiovascular + Peripheral Vascular Disease and DVT (INR 1.4) + Valvular Problems/Murmurs Rhythm:Regular Rate:Normal     Neuro/Psych negative neurological ROS  negative psych ROS   GI/Hepatic Neg liver ROS, Colon cancer: surgery, XRT, chemo   Endo/Other  negative endocrine ROS  Renal/GU negative Renal ROS     Musculoskeletal   Abdominal   Peds  Hematology negative hematology ROS (+)   Anesthesia Other Findings   Reproductive/Obstetrics                           Anesthesia Physical  Anesthesia Plan  ASA: III  Anesthesia Plan: MAC   Post-op Pain Management:    Induction: Intravenous  Airway Management Planned:   Additional Equipment:   Intra-op Plan:   Post-operative Plan:   Informed Consent: I have reviewed the patients History and Physical, chart, labs and discussed the procedure including the risks, benefits and alternatives for the proposed anesthesia with the patient or authorized representative who has indicated his/her understanding and acceptance.   Dental advisory given  Plan Discussed with: CRNA  Anesthesia Plan Comments:         Anesthesia Quick Evaluation

## 2013-01-12 NOTE — Op Note (Signed)
[facility] [address] [place], [address]   ENDOSCOPIC ULTRASOUND PROCEDURE REPORT  PATIENT: Terry, Vargas  MR#: [id number] BIRTHDATE: [birth year]  GENDER: Male ENDOSCOPIST: Rachael Fee, MD REFERRED BY:  Mardelle Matte, M.D. PROCEDURE DATE:  01/12/2013 PROCEDURE:   Upper EUS and Celiac plexus block ASA CLASS:      Class III INDICATIONS:   1.  metastatic colon cancer, adenopathy near celiac, SMA. MEDICATIONS: MAC sedation, administered by CRNA  DESCRIPTION OF PROCEDURE:   After the risks benefits and alternatives of the procedure were  explained, informed consent was obtained. The patient was then placed in the left, lateral, decubitus postion and IV sedation was administered. Throughout the procedure, the patients blood pressure, pulse and oxygen saturations were monitored continuously.  Under direct visualization, the Pentax Radial EUS [id number]  endoscope was introduced through the mouth  and advanced to the second portion of the duodenum .  Water was used as necessary to provide an acoustic interface.  Upon completion of the imaging, water was removed and the patient was sent to the recovery room in satisfactory condition.    Endoscopic findings (limited views with linear echoendoscope): 1. Normal UGI tract  EUS findings: 1. Malignanty appearing lymphnodes were noted around adominal aorta. 2. The celiac artery takeoff was clearly identified and then celiac plexus neurolysis was performed using 20guage Celiac Plexus Needle in usual fashion.  Sterile saline, followed by 10cc of 0.25% bupivicaine at centerline celiac artery takeoff, followed by 20cc of 98% alcohol, followed by further sterile saline injection into the tract.  Impression: Celiac plexus neurolysis performed.  He will likely notice improvement in pain.   _______________________________ eSigned:  Rachael Fee, MD 01/12/2013 11:35 AM

## 2013-01-12 NOTE — Preoperative (Signed)
Beta Blockers   Reason not to administer Beta Blockers:Not Applicable 

## 2013-01-13 ENCOUNTER — Encounter (HOSPITAL_COMMUNITY): Payer: Self-pay | Admitting: Gastroenterology

## 2013-01-16 ENCOUNTER — Other Ambulatory Visit: Payer: Self-pay | Admitting: Oncology

## 2013-01-18 ENCOUNTER — Ambulatory Visit: Payer: BC Managed Care – PPO

## 2013-01-18 ENCOUNTER — Ambulatory Visit (HOSPITAL_BASED_OUTPATIENT_CLINIC_OR_DEPARTMENT_OTHER): Payer: BC Managed Care – PPO | Admitting: Nurse Practitioner

## 2013-01-18 ENCOUNTER — Ambulatory Visit (HOSPITAL_BASED_OUTPATIENT_CLINIC_OR_DEPARTMENT_OTHER): Payer: BC Managed Care – PPO | Admitting: Pharmacist

## 2013-01-18 ENCOUNTER — Other Ambulatory Visit: Payer: Self-pay | Admitting: *Deleted

## 2013-01-18 ENCOUNTER — Other Ambulatory Visit (HOSPITAL_BASED_OUTPATIENT_CLINIC_OR_DEPARTMENT_OTHER): Payer: BC Managed Care – PPO

## 2013-01-18 ENCOUNTER — Telehealth: Payer: Self-pay | Admitting: Oncology

## 2013-01-18 VITALS — BP 154/95 | HR 64 | Temp 97.7°F | Resp 20 | Ht 69.0 in | Wt 151.4 lb

## 2013-01-18 DIAGNOSIS — Z7901 Long term (current) use of anticoagulants: Secondary | ICD-10-CM

## 2013-01-18 DIAGNOSIS — C189 Malignant neoplasm of colon, unspecified: Secondary | ICD-10-CM

## 2013-01-18 DIAGNOSIS — Z86718 Personal history of other venous thrombosis and embolism: Secondary | ICD-10-CM

## 2013-01-18 DIAGNOSIS — I82409 Acute embolism and thrombosis of unspecified deep veins of unspecified lower extremity: Secondary | ICD-10-CM

## 2013-01-18 DIAGNOSIS — D696 Thrombocytopenia, unspecified: Secondary | ICD-10-CM

## 2013-01-18 DIAGNOSIS — C182 Malignant neoplasm of ascending colon: Secondary | ICD-10-CM

## 2013-01-18 DIAGNOSIS — I82402 Acute embolism and thrombosis of unspecified deep veins of left lower extremity: Secondary | ICD-10-CM

## 2013-01-18 DIAGNOSIS — E876 Hypokalemia: Secondary | ICD-10-CM

## 2013-01-18 DIAGNOSIS — R109 Unspecified abdominal pain: Secondary | ICD-10-CM

## 2013-01-18 DIAGNOSIS — C778 Secondary and unspecified malignant neoplasm of lymph nodes of multiple regions: Secondary | ICD-10-CM

## 2013-01-18 DIAGNOSIS — R21 Rash and other nonspecific skin eruption: Secondary | ICD-10-CM

## 2013-01-18 LAB — CBC WITH DIFFERENTIAL/PLATELET
Basophils Absolute: 0 10*3/uL (ref 0.0–0.1)
Eosinophils Absolute: 0.3 10*3/uL (ref 0.0–0.5)
HGB: 12.4 g/dL — ABNORMAL LOW (ref 13.0–17.1)
LYMPH%: 11.9 % — ABNORMAL LOW (ref 14.0–49.0)
MCH: 31.5 pg (ref 27.2–33.4)
MCV: 90.1 fL (ref 79.3–98.0)
MONO%: 9.7 % (ref 0.0–14.0)
NEUT#: 4.9 10*3/uL (ref 1.5–6.5)
Platelets: 223 10*3/uL (ref 140–400)
RBC: 3.94 10*6/uL — ABNORMAL LOW (ref 4.20–5.82)

## 2013-01-18 LAB — PROTIME-INR: Protime: 31.2 Seconds — ABNORMAL HIGH (ref 10.6–13.4)

## 2013-01-18 MED ORDER — DEXAMETHASONE 4 MG PO TABS: ORAL_TABLET | ORAL | Status: DC

## 2013-01-18 MED ORDER — OXYCODONE HCL 5 MG PO TABS: 5.0000 mg | ORAL_TABLET | ORAL | Status: DC | PRN

## 2013-01-18 NOTE — Progress Notes (Addendum)
INR at goal today. Pt still having abdominal pain. He recently had a procedure on 01/12/13 to try to alleviate his nerve pain but he states this has not helped yet. He was off of his coumadin for 48 hrs prior to the procedure last week on Tuesday and Wednesday (8/26-8/27) and then restarted his coumadin on Thursday 01/12/13 in the evening after the procedure was performed. He reports no bleeding/bruising, no missed doses. His pain medication regimen is changing slightly to try and find some relieve. He will be started on dexamethasone 4 mg bid x 3 days then daily. This could potentially increase his INR so I will have him take a slightly lower dose this week. Patient will Take a half tablet today, Thursday, Friday and Saturday while on the steroids then Continue 4mg  daily except 2mg  on TuThuSat. Recheck INR in 10 days with next appointment for PET scan on 9/12 (scan at 7am) lab at 8am coumadin clinic at 8:15am  Samples provided:  4 mg coumadin tablets x 10 LOT: [id number] Exp: 02/2014

## 2013-01-18 NOTE — Patient Instructions (Addendum)
INR at goal today Starting steroids to help with the pain so will decrease your dose slightly this week Take a half tablet today, Thursday, Friday and Saturday while on the steroids then Continue 4mg  daily except 2mg  on TuThuSat. Recheck INR in 2 weeks with next appointments  Also can start taking Mirlax Over the counter medication to help with bowel movement in addition to the medication you are taking (likely Senna)

## 2013-01-18 NOTE — Progress Notes (Addendum)
OFFICE PROGRESS NOTE  Interval history:  [name] returns as scheduled. He underwent a celiac plexus block on 01/12/2013. Thus far no change in pain. He is taking oxycodone, Dilaudid and Tylenol with temporary pain relief. He is having some constipation. He took a laxative yesterday. Appetite is poor. He is losing weight. No nausea. Skin rash is better.   Objective: Blood pressure 154/95, pulse 64, temperature 97.7 F (36.5 C), temperature source Oral, resp. rate 20, height 5\' 9"  (1.753 m), weight 151 lb 6.4 oz (68.675 kg).  No thrush. No palpable cervical, supraclavicular or axillary lymph nodes. Lungs clear. Regular cardiac rhythm. Port-A-Cath site is without erythema. Abdomen is soft. No hepatomegaly. Trace edema at the left lower leg. Acne type rash over the face, trunk and extremities.  Lab Results: Lab Results  Component Value Date   WBC 6.7 01/18/2013   HGB 12.4* 01/18/2013   HCT 35.5* 01/18/2013   MCV 90.1 01/18/2013   PLT 223 01/18/2013    Chemistry:    Chemistry      Component Value Date/Time   NA 139 12/21/2012 0829   NA 136 08/03/2012 0830   K 4.0 12/21/2012 0829   K 4.8 08/03/2012 0830   CL 108* 11/09/2012 0835   CL 97 08/03/2012 0830   CO2 25 12/21/2012 0829   CO2 30 08/03/2012 0830   BUN 9.1 12/21/2012 0829   BUN 12 08/03/2012 0830   CREATININE 0.8 12/21/2012 0829   CREATININE 0.84 08/03/2012 0830      Component Value Date/Time   CALCIUM 8.8 12/21/2012 0829   CALCIUM 9.2 08/03/2012 0830   ALKPHOS 75 12/21/2012 0829   ALKPHOS 56 10/16/2011 0745   AST 30 12/21/2012 0829   AST 38* 10/16/2011 0745   ALT 34 12/21/2012 0829   ALT 46 10/16/2011 0745   BILITOT 0.30 12/21/2012 0829   BILITOT 0.4 10/16/2011 0745       Studies/Results: Ct Chest W Contrast  01/02/2013   *RADIOLOGY REPORT*  Clinical Data:  Restaging metastatic colon cancer.  History of partial colon resection and ongoing chemotherapy.  CT CHEST, ABDOMEN AND PELVIS WITH CONTRAST  Technique:  Multidetector CT imaging of the chest,  abdomen and pelvis was performed following the standard protocol during bolus administration of intravenous contrast.  Contrast: OMNIPAQUE IOHEXOL 300 MG/ML  SOLN  Comparison:  Prior examinations 07/29/2012 and 10/20/2012.  CT CHEST  Findings:  There is a stable 8 mm subcarinal node on image number 30.  Mildly prominent hilar lymph nodes are also stable. There is a 7 mm retrocrural node on image 52 which is unchanged.  There is no progressive mediastinal, hilar or axillary lymphadenopathy.  Left subclavian Port-A-Cath appears unchanged with its tip in the lower SVC.  There is stable atherosclerosis of the aorta, great vessels and coronary arteries.  No significant pleural or pericardial effusion is present. Previously demonstrated lingular nodule is difficult to evaluate currently due to motion and proximity to an adjacent vessel; this appears slightly smaller, measuring approximately 4 mm on image 38. No new or enlarging pulmonary nodules are identified.  There are no worrisome osseous findings.  IMPRESSION:  1.  Probable slight decrease in size of previously demonstrated lingular nodule. 2.  Otherwise stable chest CT.  CT ABDOMEN AND PELVIS  Findings:  The dominant ill-defined nodal mass posterior to the splenic vein and pancreas does not appear significantly changed, measuring approximately 3.0 x 2.1 cm transverse on image 67. Smaller celiac and left periaortic nodes are noted  on images 63, 72 and 276, unchanged.  There is no progressive lymphadenopathy. There is no ascites or peritoneal nodularity.  The liver, gallbladder and biliary system appear unremarkable.  No intrinsic pancreatic abnormalities are seen.  The spleen, adrenal glands and kidneys appear normal.  The ileocolonic anastomosis appears unchanged.  No mucosal lesions are identified.  There is no evidence of bowel obstruction.  There is stable mild aorto iliac atherosclerosis.  There are stable dystrophic calcifications within the prostate  gland.  Mild degenerative changes throughout the spine are stable.  IMPRESSION:  1.  Stable retroperitoneal and retrocrural lymphadenopathy. 2.  No evidence of disease progression.  No peritoneal involvement identified.   Original Report Authenticated By: Carey Bullocks, M.D.   Ct Abdomen Pelvis W Contrast  01/02/2013   *RADIOLOGY REPORT*  Clinical Data:  Restaging metastatic colon cancer.  History of partial colon resection and ongoing chemotherapy.  CT CHEST, ABDOMEN AND PELVIS WITH CONTRAST  Technique:  Multidetector CT imaging of the chest, abdomen and pelvis was performed following the standard protocol during bolus administration of intravenous contrast.  Contrast: OMNIPAQUE IOHEXOL 300 MG/ML  SOLN  Comparison:  Prior examinations 07/29/2012 and 10/20/2012.  CT CHEST  Findings:  There is a stable 8 mm subcarinal node on image number 30.  Mildly prominent hilar lymph nodes are also stable. There is a 7 mm retrocrural node on image 52 which is unchanged.  There is no progressive mediastinal, hilar or axillary lymphadenopathy.  Left subclavian Port-A-Cath appears unchanged with its tip in the lower SVC.  There is stable atherosclerosis of the aorta, great vessels and coronary arteries.  No significant pleural or pericardial effusion is present. Previously demonstrated lingular nodule is difficult to evaluate currently due to motion and proximity to an adjacent vessel; this appears slightly smaller, measuring approximately 4 mm on image 38. No new or enlarging pulmonary nodules are identified.  There are no worrisome osseous findings.  IMPRESSION:  1.  Probable slight decrease in size of previously demonstrated lingular nodule. 2.  Otherwise stable chest CT.  CT ABDOMEN AND PELVIS  Findings:  The dominant ill-defined nodal mass posterior to the splenic vein and pancreas does not appear significantly changed, measuring approximately 3.0 x 2.1 cm transverse on image 67. Smaller celiac and left periaortic  nodes are noted on images 63, 72 and 276, unchanged.  There is no progressive lymphadenopathy. There is no ascites or peritoneal nodularity.  The liver, gallbladder and biliary system appear unremarkable.  No intrinsic pancreatic abnormalities are seen.  The spleen, adrenal glands and kidneys appear normal.  The ileocolonic anastomosis appears unchanged.  No mucosal lesions are identified.  There is no evidence of bowel obstruction.  There is stable mild aorto iliac atherosclerosis.  There are stable dystrophic calcifications within the prostate gland.  Mild degenerative changes throughout the spine are stable.  IMPRESSION:  1.  Stable retroperitoneal and retrocrural lymphadenopathy. 2.  No evidence of disease progression.  No peritoneal involvement identified.   Original Report Authenticated By: Carey Bullocks, M.D.    Medications: I have reviewed the patient's current medications.  Assessment/Plan:  1-Stage III (T4, N2) adenocarcinoma of the right colon, status post a right colectomy 11/25/2010. K-ras wild-type.  He began adjuvant FOLFOX chemotherapy 12/31/2010. He completed cycle 12 of FOLFOX on 06/03/2011.  #2-PET scan 12/29/2010 with hypermetabolic retroperitoneal and anterior abdominal adenopathy, postoperative changes of the right colectomy with a possible seroma in the anterior abdomen. Patchy activity was noted in the parotid glands, potentially  inflammatory in etiology.  -The hypermetabolic retroperitoneal lymph nodes potentially represented metastatic disease. We presented his case at the GI tumor conference and discussed the case with Dr. Dwain Sarna. A decision was made to proceed with "adjuvant" therapy with the plan for a restaging CT at a 3 to four-month interval.  -The restaging CT on 03/23/2011 revealed a decrease in the periaortic lymphadenopathy, no new adenopathy, and no evidence of liver metastases. This suggests the small retroperitoneal lymph nodes may represent metastatic colon  cancer versus resolving "inflammatory" nodes.  -Restaging CT 07/13/2011 revealed increased retroperitoneal lymphadenopathy consistent with metastatic disease.  -Restaging PET scan 07/22/2011 confirmed persistent hypermetabolic retroperitoneal lymph nodes. -Restaging CT 11/12/2011 with a slight increase in the size of retroperitoneal/mesenteric lymph nodes, no other evidence of progressive metastatic disease  -Restaging CT 01/27/2012 with a slight increase in the size of a necrotic celiac node, slight decrease in the para-aortic nodes  -Status post palliative radiation to the abdominal/retroperitoneal lymph nodes completed on 04/04/2012  -Restaging CT 05/31/2012 with a slight decrease in the size of abdominal lymph nodes and a stable mass adjacent to the SMA,? New left lung lesion  -Restaging CT 07/29/2012 with new mediastinal/hilar nodes and a new left lingula nodule. New retrocrural and retroperitoneal nodes.  -initiation of FOLFIRI/panitumumab on an every 2 week schedule 08/17/2012.  -restaging CT 10/19/2012 after 5 cycles of FOLFIRI/panitumumab with improvement in mediastinal adenopathy, a left lingular nodule, retroperitoneal nodes, and a stable node adjacent to the spleen.  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 A basal cell carcinoma was recently removed from a right finger by Dr. Karlyn Agee.  #6-Left leg deep vein thrombosis confirmed on a Doppler ultrasound 01/28/2011, now maintained on Coumadin.  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 He has persistent mild numbness in the fingertips and toes. The numbness does not interfere with activity.  #8-Mild neutropenia on  03/11/2011, he received Neulasta following cycle #6 of FOLFOX. He also received Neulasta support following cycle 8, cycle 10 and cycle #12.  #9-Thrombocytopenia secondary to chemotherapy. He has mild thrombocytopenia on labs today.  #10-Low back pain-most likely related to the necrotic celiac node. He completed palliative radiation on 04/04/2012. The pain resolved. He recently presented with progressive back pain-likely secondary to progression of retrocrural/retroperitoneal adenopathy. The low back pain has resolved.  #11-Fever/rash on 07/29/2012-? contrast reaction.  #12-Clinical and CT evidence of a fractured Port-A-Cath 07/29/2012. The Port-A-Cath was removed on 08/04/2012. There was a linear laceration in the line. New Port-A-Cath placed on 08/15/2012.  #13-skin rash secondary to panitumumab-he will continue minocycline. The rash is better.  #14-abdominal pain beginning approximately 1 week after treatment occurring with cycles 7 and 8 FOLFIRI/Panitumumab, now persistent. Status post celiac plexus block 01/12/2013. The pain is unchanged. #15-hypokalemia, maintained on a potassium supplement   Disposition-Mr. Ostlund continues to have abdominal pain. He will begin a trial of dexamethasone 4 mg twice daily for 3 days then 4 mg daily. We are referring him for a PET scan. Dr. Truett Perna recommends a referral to Dr. Maryruth Hancock at [facility] to evaluate for possible enrollment on a clinical trial. Oxycodone prescription was refilled.  He will contact the office if there is no improvement in pain with the dexamethasone  and we will begin OxyContin.  He will return for a followup visit in approximately 2 weeks.  Patient seen with Dr. Truett Perna.   Lonna Cobb ANP/GNP-BC   This was a shared visit with Lonna Cobb  The pain has not improved with a celiac block. We decided to begin a trial of Decadron since he obtained pain relief immediately following each cycle of chemotherapy.  He will be referred for a  restaging PET scan. I will refer him to Dr. Maryruth Hancock to consider enrollment on a clinical trial.   Mr. Arnott will contact us if the pain does not improved with Decadron.  Mancel Bale, M.D.

## 2013-01-18 NOTE — Telephone Encounter (Signed)
Gave pt appt for MD on September 29 per Dr. Truett Perna

## 2013-01-19 ENCOUNTER — Telehealth: Payer: Self-pay | Admitting: Oncology

## 2013-01-19 NOTE — Telephone Encounter (Signed)
Pt appt with Dr. Maryruth Hancock is 01/25/13@3 :00. Medical records faxed, pt aware to hand carry scans to appt.

## 2013-01-20 ENCOUNTER — Telehealth: Payer: Self-pay | Admitting: *Deleted

## 2013-01-20 MED ORDER — MORPHINE SULFATE ER 30 MG PO TBCR: 30.0000 mg | EXTENDED_RELEASE_TABLET | Freq: Two times a day (BID) | ORAL | Status: DC

## 2013-01-20 NOTE — Telephone Encounter (Signed)
Call from pt with update on his pain. He reports pain kept him up last night after taking 1 Dilaudid tablet. Took Oxycodone at 5, 7 and 9 AM with no relief. Rates pain at 11/10 this morning. Dr. Truett Perna mentioned starting a long acting pain med. Pt states he wants to start this. Reviewed with Dr. Truett Perna. Orders received: Discontinue Decadron. Use Dilaudid instead of Oxycodone. Begin MS Contin 30 mg Q12 hours. DO NOT DRIVE. Reviewed instructions with pt, pt appropriately verbalized instructions. He will pick up MS Contin Rx today.

## 2013-01-24 ENCOUNTER — Telehealth: Payer: Self-pay | Admitting: *Deleted

## 2013-01-24 ENCOUNTER — Other Ambulatory Visit: Payer: Self-pay | Admitting: *Deleted

## 2013-01-24 DIAGNOSIS — C189 Malignant neoplasm of colon, unspecified: Secondary | ICD-10-CM

## 2013-01-24 MED ORDER — HYDROMORPHONE HCL 4 MG PO TABS: ORAL_TABLET | ORAL | Status: DC

## 2013-01-24 MED ORDER — MORPHINE SULFATE ER 30 MG PO TBCR: EXTENDED_RELEASE_TABLET | ORAL | Status: DC

## 2013-01-24 NOTE — Telephone Encounter (Signed)
Pt called reporting:  "I'm having a lot of abdominal pain and the pain meds are not helping"  Pt states he takes MS Contin at 5pm; then has to take Dilaudid at 9pm and still hurts throughout the night.  Wakes up constantly and asking "can I get something stronger to last throughout the night to rest? Has tried the Oxycodone as well and nothing helps.  Note to Dr. Truett Perna

## 2013-01-24 NOTE — Telephone Encounter (Signed)
Per Dr. Truett Perna; instructed pt that he can increase MS Contin to 60 mg q PM, and increase Dilaudid to 4-8 mg every 4 hours for breakthrough pain.  Pt states he will need more Dilaudid and can pick-up script Thursday; and has enough of the MS Contin.  Pt verbalized understanding of instructions.

## 2013-01-27 ENCOUNTER — Encounter (HOSPITAL_COMMUNITY): Payer: Self-pay

## 2013-01-27 ENCOUNTER — Encounter (HOSPITAL_COMMUNITY)
Admission: RE | Admit: 2013-01-27 | Discharge: 2013-01-27 | Disposition: A | Discharge status: Home / Self Care | Payer: BC Managed Care – PPO | Source: Ambulatory Visit | Attending: Nurse Practitioner | Admitting: Nurse Practitioner

## 2013-01-27 ENCOUNTER — Other Ambulatory Visit (HOSPITAL_BASED_OUTPATIENT_CLINIC_OR_DEPARTMENT_OTHER): Payer: BC Managed Care – PPO

## 2013-01-27 ENCOUNTER — Ambulatory Visit (HOSPITAL_BASED_OUTPATIENT_CLINIC_OR_DEPARTMENT_OTHER): Payer: BC Managed Care – PPO | Admitting: Pharmacist

## 2013-01-27 DIAGNOSIS — C189 Malignant neoplasm of colon, unspecified: Secondary | ICD-10-CM | POA: Insufficient documentation

## 2013-01-27 DIAGNOSIS — J984 Other disorders of lung: Secondary | ICD-10-CM | POA: Insufficient documentation

## 2013-01-27 DIAGNOSIS — Z9049 Acquired absence of other specified parts of digestive tract: Secondary | ICD-10-CM | POA: Insufficient documentation

## 2013-01-27 DIAGNOSIS — I82409 Acute embolism and thrombosis of unspecified deep veins of unspecified lower extremity: Secondary | ICD-10-CM

## 2013-01-27 DIAGNOSIS — I2510 Atherosclerotic heart disease of native coronary artery without angina pectoris: Secondary | ICD-10-CM | POA: Insufficient documentation

## 2013-01-27 DIAGNOSIS — C772 Secondary and unspecified malignant neoplasm of intra-abdominal lymph nodes: Secondary | ICD-10-CM | POA: Insufficient documentation

## 2013-01-27 DIAGNOSIS — I82402 Acute embolism and thrombosis of unspecified deep veins of left lower extremity: Secondary | ICD-10-CM

## 2013-01-27 DIAGNOSIS — C771 Secondary and unspecified malignant neoplasm of intrathoracic lymph nodes: Secondary | ICD-10-CM | POA: Insufficient documentation

## 2013-01-27 LAB — POCT INR: INR: 1.6

## 2013-01-27 LAB — PROTIME-INR

## 2013-01-27 MED ORDER — FLUDEOXYGLUCOSE F - 18 (FDG) INJECTION
17.6000 | Freq: Once | INTRAVENOUS | Status: AC | PRN
  Administered 2013-01-27: 17.6 via INTRAVENOUS

## 2013-01-27 NOTE — Progress Notes (Signed)
INR = 1.6 Pt has been taking Coumadin 4 mg daily except 2 mg Tu/Th/Sat. No missed doses of Coumadin. He is on steroids currently but will not take any more starting today.  They've not helped his abd pain. He is on MS Contin now for pain.  He has not slept well in over 3 weeks.  Pain is positional. Pt had a PET scan this AM. Pt has Lovenox inj that he'd like to bring in for [place] to destroy.  They may be in date but they were kept in his garage so not at room temp. He mentioned his MD at [facility] may be in favor of him stopping Coumadin. INR today is low.  Increase Coumadin back up to 4 mg daily. Recheck INR on 02/03/13.  Pt is going on vacation to [place] the week for 9/22-9/27. He has an appt w/ Dr. Truett Perna on 9/29 to discuss PET scan results but is hoping to move that appt up so he can enjoy is vacation without worrying over the results.  I told him he can call us to coordinate the appts if the MD visit is changed. Samples: Coumadin 4 mg (lot# [id number]; exp 02/2014) x 30 tabs Ebony Hail, Pharm.D., CPP 01/27/2013@9 :25 AM

## 2013-02-01 ENCOUNTER — Telehealth: Payer: Self-pay | Admitting: *Deleted

## 2013-02-01 ENCOUNTER — Other Ambulatory Visit: Payer: Self-pay | Admitting: Oncology

## 2013-02-01 DIAGNOSIS — C189 Malignant neoplasm of colon, unspecified: Secondary | ICD-10-CM

## 2013-02-01 NOTE — Telephone Encounter (Signed)
Dr. Truett Perna left message for pt informing him that he will be contacted to schedule celiac block. Received message from pt requesting appt ASAP. [facility] they may have appt for 9/18. They will be contacting pt. Pt had questions as to why he's being referred to radiology when Dr. Christella Hartigan has done this before. Explained to pt that this was discussed with Dr. Christella Hartigan and the radiologist, it was agreed to try a different approach. He voiced understanding. Pt also reports pain still wakes him up at night despite doubling his MS Contin. He takes Diluadid 4 mg Q 3-4 hours PRN pain. Recommended he try #2 tabs at night to see if this offers better pain relief.

## 2013-02-02 ENCOUNTER — Other Ambulatory Visit: Payer: Self-pay | Admitting: Dermatology

## 2013-02-02 ENCOUNTER — Ambulatory Visit
Admission: RE | Admit: 2013-02-02 | Discharge: 2013-02-02 | Disposition: A | Discharge status: Home / Self Care | Payer: BC Managed Care – PPO | Source: Ambulatory Visit | Attending: Oncology | Admitting: Oncology

## 2013-02-02 ENCOUNTER — Telehealth: Payer: Self-pay | Admitting: *Deleted

## 2013-02-02 DIAGNOSIS — C189 Malignant neoplasm of colon, unspecified: Secondary | ICD-10-CM

## 2013-02-02 NOTE — Telephone Encounter (Signed)
Pt is being scheduled for celiac block, per Tiffany in radiology, Coumadin will need to be held for 4 days. Reviewed with Dr. Truett Perna: OK to hold Coumadin for procedure.

## 2013-02-02 NOTE — Telephone Encounter (Signed)
Late entry for 02/01/13 at 1644: Message from [place], Sports coach for [employer]. Calling to make MD aware that they are working with pt, providing educational resources.

## 2013-02-03 ENCOUNTER — Ambulatory Visit (HOSPITAL_BASED_OUTPATIENT_CLINIC_OR_DEPARTMENT_OTHER): Payer: BC Managed Care – PPO | Admitting: Pharmacist

## 2013-02-03 ENCOUNTER — Other Ambulatory Visit: Payer: Self-pay | Admitting: *Deleted

## 2013-02-03 ENCOUNTER — Other Ambulatory Visit (HOSPITAL_BASED_OUTPATIENT_CLINIC_OR_DEPARTMENT_OTHER): Payer: BC Managed Care – PPO

## 2013-02-03 DIAGNOSIS — I82402 Acute embolism and thrombosis of unspecified deep veins of left lower extremity: Secondary | ICD-10-CM

## 2013-02-03 DIAGNOSIS — C189 Malignant neoplasm of colon, unspecified: Secondary | ICD-10-CM

## 2013-02-03 DIAGNOSIS — I82409 Acute embolism and thrombosis of unspecified deep veins of unspecified lower extremity: Secondary | ICD-10-CM

## 2013-02-03 LAB — PROTIME-INR
INR: 2.5 (ref 2.00–3.50)
Protime: 30 Seconds — ABNORMAL HIGH (ref 10.6–13.4)

## 2013-02-03 MED ORDER — MORPHINE SULFATE ER 60 MG PO TBCR: 60.0000 mg | EXTENDED_RELEASE_TABLET | Freq: Two times a day (BID) | ORAL | Status: DC

## 2013-02-03 MED ORDER — HYDROMORPHONE HCL 4 MG PO TABS: 8.0000 mg | ORAL_TABLET | ORAL | Status: DC | PRN

## 2013-02-03 NOTE — Progress Notes (Signed)
INR at goal of 2-3.  No bleeding/bruising.  Mr [name] continues to be in a lot of pain.  Dr Truett Perna is adjusting his pain medications today.  Mr [name] has scheduled a celiac block on 02/14/13.  He will stop his coumadin on 02/10/13, then resume coumadin after procedure at 4mg  daily.  Will check PT/INR on 02/23/13 after resuming coumadin.  Mr [name] is looking at being enrolled at trial at [facility] in the future.  If he is enrolled, Mr [name] will go off coumadin and start Lovenox.  He will keep [place] updated on this.

## 2013-02-03 NOTE — Telephone Encounter (Signed)
Pt in for Coumadin Clinic appt. Celiac block procedure is scheduled for 9/29. Going on vacation 9/20. Will need refills on pain medication. Currently taking MS Contin 30 mg Q AM 60 mg Q PM with little relief. Reviewed with Dr. Truett Perna: MS Contin dose increased to 60 mg BID. Pt to take #2-3  Dilaudid 4 mg tablets PRN breakthrough pain. Spoke with pt in person. Instructed him to fill new Rx for MS Contin. Take one twice daily. He voiced understanding.

## 2013-02-06 ENCOUNTER — Encounter: Payer: Self-pay | Admitting: Oncology

## 2013-02-06 ENCOUNTER — Ambulatory Visit: Payer: BC Managed Care – PPO | Admitting: Oncology

## 2013-02-06 NOTE — Progress Notes (Signed)
Put fmla form on nurse's desk °

## 2013-02-08 ENCOUNTER — Ambulatory Visit: Payer: BC Managed Care – PPO | Admitting: Gastroenterology

## 2013-02-09 ENCOUNTER — Encounter (HOSPITAL_COMMUNITY): Payer: Self-pay | Admitting: Pharmacy Technician

## 2013-02-10 ENCOUNTER — Other Ambulatory Visit: Payer: Self-pay | Admitting: Radiology

## 2013-02-13 ENCOUNTER — Ambulatory Visit (HOSPITAL_BASED_OUTPATIENT_CLINIC_OR_DEPARTMENT_OTHER): Payer: BC Managed Care – PPO | Admitting: Nurse Practitioner

## 2013-02-13 ENCOUNTER — Telehealth: Payer: Self-pay | Admitting: Oncology

## 2013-02-13 VITALS — BP 143/87 | HR 83 | Temp 97.7°F | Resp 20 | Ht 69.0 in | Wt 145.7 lb

## 2013-02-13 DIAGNOSIS — G893 Neoplasm related pain (acute) (chronic): Secondary | ICD-10-CM

## 2013-02-13 DIAGNOSIS — C189 Malignant neoplasm of colon, unspecified: Secondary | ICD-10-CM

## 2013-02-13 DIAGNOSIS — E876 Hypokalemia: Secondary | ICD-10-CM

## 2013-02-13 DIAGNOSIS — D6959 Other secondary thrombocytopenia: Secondary | ICD-10-CM

## 2013-02-13 DIAGNOSIS — C182 Malignant neoplasm of ascending colon: Secondary | ICD-10-CM

## 2013-02-13 DIAGNOSIS — C778 Secondary and unspecified malignant neoplasm of lymph nodes of multiple regions: Secondary | ICD-10-CM

## 2013-02-13 MED ORDER — HYDROMORPHONE HCL 4 MG PO TABS: 8.0000 mg | ORAL_TABLET | ORAL | Status: DC | PRN

## 2013-02-13 NOTE — Telephone Encounter (Signed)
GV AND PRINTED APPT SCHED AND AVS FOR PT FOR oct °

## 2013-02-13 NOTE — Progress Notes (Addendum)
OFFICE PROGRESS NOTE  Interval history:  Terry Vargas is a 52 year old man with metastatic colon cancer. He was referred to [facility] for consideration of enrollment on a clinical trial. He is considering enrollment on a clinical trial at [facility] specifically for microsatellite high tumors.  He presents today for scheduled followup. He continues to have significant abdominal pain. He is now on MS Contin 60 mg every 12 hours with frequent Dilaudid and periodic oxycodone as needed. He is also taking Tylenol. The pain interrupts sleep. He estimates he is taking pain medication every 2-3 hours. Appetite is poor. He is losing weight. Bowel habits alternating constipation/diarrhea. No nausea or vomiting. Skin rash is better. He has discontinued minocycline. He denies shortness of breath.   Objective: Blood pressure 143/87, pulse 83, temperature 97.7 F (36.5 C), temperature source Oral, resp. rate 20, height 5\' 9"  (1.753 m), weight 145 lb 11.2 oz (66.089 kg).  No thrush or ulcerations. Lungs are clear. Regular cardiac rhythm. Port-A-Cath site is without erythema. Abdomen is soft with tenderness at the upper abdomen. No hepatomegaly. Trace edema at the left lower leg. Skin has a dry appearance. The acne type rash is better. He is alert and oriented. Gait is normal.  Lab Results: Lab Results  Component Value Date   WBC 6.7 01/18/2013   HGB 12.4* 01/18/2013   HCT 35.5* 01/18/2013   MCV 90.1 01/18/2013   PLT 223 01/18/2013    Chemistry:    Chemistry      Component Value Date/Time   NA 139 12/21/2012 0829   NA 136 08/03/2012 0830   K 4.0 12/21/2012 0829   K 4.8 08/03/2012 0830   CL 108* 11/09/2012 0835   CL 97 08/03/2012 0830   CO2 25 12/21/2012 0829   CO2 30 08/03/2012 0830   BUN 9.1 12/21/2012 0829   BUN 12 08/03/2012 0830   CREATININE 0.8 12/21/2012 0829   CREATININE 0.84 08/03/2012 0830      Component Value Date/Time   CALCIUM 8.8 12/21/2012 0829   CALCIUM 9.2 08/03/2012 0830   ALKPHOS 75 12/21/2012 0829   ALKPHOS 56  10/16/2011 0745   AST 30 12/21/2012 0829   AST 38* 10/16/2011 0745   ALT 34 12/21/2012 0829   ALT 46 10/16/2011 0745   BILITOT 0.30 12/21/2012 0829   BILITOT 0.4 10/16/2011 0745       Studies/Results: Nm Pet Image Restag (ps) Skull Base To Thigh  01/27/2013   CLINICAL DATA:  Subsequent treatment strategy for metastatic colon cancer. Restaging scan.  EXAM: NUCLEAR MEDICINE PET SKULL BASE TO THIGH  FASTING BLOOD GLUCOSE:  Value:  93 mg/dl  TECHNIQUE: 16.1 mCi W-96 FDG was injected intravenously. CT data was obtained and used for attenuation correction and anatomic localization only. (This was not acquired as a diagnostic CT examination.) Additional exam technical data entered on technologist worksheet.  COMPARISON:  PET-CT 07/22/2011  FINDINGS: NECK  No hypermetabolic lymph nodes in the neck.  CHEST  Today's study demonstrates new mediastinal and bilateral hilar lymphadenopathy. Specific examples include a posterior mediastinal lymph node measuring 11 mm in short axis on the right side of the spine (image 93 of series 2), which is hypermetabolic (SUVmax = 8.7). There are also hypermetabolic bilateral hilar lymph nodes (SUVmax = 12.5 on the left and 7.6 on the right)which cannot be discretely measured on today's non contrast CT examination. Enlarged subcarinal lymph node measuring 17 mm in short axis is also markedly hypermetabolic (SUVmax = 11.2). Right retrocrural lymph node measuring  11 mm in short axis (SUVmax = 9.4). No definite suspicious appearing pulmonary nodules or masses. Small amount of scarring in the inferior segment of the lingula. No pleural effusions. Coronary artery calcifications in the left anterior descending coronary artery. Left subclavian single-lumen porta cath with tip terminating at the superior cavoatrial junction.  ABDOMEN/PELVIS  There are numerous upper abdominal and retroperitoneal lymph nodes which are hypermetabolic, many of which are enlarged. The most substantial of these is in  the root of the small bowel mesentery adjacent to the proximal superior mesenteric artery on the left side measuring 2.7 x 2.6 cm (image 153 of series 2), hypermetabolic (SUVmax = 9.1). There is also an ovoid shaped lymph node anterior to the aorta inferior to the left renal vein demonstrated on image 156 of series 2 measuring 2.6 x 1.0 cm (SUVmax = 5.3). Small left periaortic lymph node measuring only 7 mm (image 163 of series 2) is hypermetabolic (SUVmax = 7.2). There is also a 1.1 cm left para-aortic lymph node (image 170 of series 2) which is hypermetabolic (SUVmax = 7.3). The unenhanced appearance of the liver, gallbladder, pancreas, spleen, bilateral adrenal glands and bilateral kidneys is unremarkable. Status post right hemicolectomy. No significant volume of ascites.  SKELETON  No hypermetabolism in the skeleton or definite aggressive appearing lytic renal blastic bony lesions to suggest presence of metastatic disease to the bones at this time.  IMPRESSION: 1. Today's study demonstrates progression of metastatic disease with increased number and size of mesenteric, retroperitoneal, mediastinal and bilateral hilar lymph nodes, as discussed above. 2. No extra nodal metastatic disease identified at this time. 3. Additional incidental findings, as above.   Electronically Signed   By: Trudie Reed M.D.   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 No known allergies to latex or iodinated contrast.  Social History: Married, lives in [place], works as an Art gallery manager.  His PET-CT from 01/27/2013 is significant for progression of  number and size of mesenteric, retroperitoneal, mediastinal, and bilateral adenopathy. The lesion in the retroperitoneum in proximity to the celiac plexus may account for his persistent abdominal pain.  HISTORY OF PRESENT ILLNESS: 52 year old male diagnosed with locally invasive colon carcinoma in July 2012, status post partial sigmoidectomy. Retroperitoneal adenopathy developed, treated with systemic chemotherapy as well as palliative radiation therapy. He had an episode of lower extremity DVT and is on Coumadin. 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 His pain however is poorly controlled despite multiple low narcotic pain medications. He rates his pain 8-10 on the visual analog pain scale. He underwent endoscopic attempted celiac plexus neurolysis in August of this year with no significant pain relief.  CHIEF COMPLAINT: Metastatic colon carcinoma with severe abdominal pain  PHYSICAL EXAMINATION: Normal mood and affect. Ambulates independently. Non-obese. 152 lb. Blood pressure 141/84, pulse 88, O2 sat 99% on room air.  REVIEW OF SYSTEMS: Negative for myocardial infarction, stroke, or bleeding diathesis.  ASSESSMENT AND PLAN: My impression is that the patient has severe persistent visceral pain is likely secondary to is retroperitoneal adenopathy, with inadequate relief from multi drug narcotic management. Despite his lack of significant response to endoscopic attempted celiac plexus  neurolysis, neurolysis. We discuss the technique of the procedure, anticipated benefits, possible risks and side effects, expected time course of symptom resolution, possibility of inadequate response, and possibility of pain recurrence. We reviewed that this is not a tumor treatment but palliative procedure. He seemed to understand and asked appropriate questions. He is motivated to proceed, therefore we can schedule the procedure at his convenience. He will need to be off his Coumadin for several days such that his INR is  less than or equal to 1.5.   Electronically Signed   By: Oley Balm M.D.   On: 02/02/2013 13:51    Medications: I have reviewed the patient's current medications.  Assessment/Plan:  1-Stage III (T4, N2) adenocarcinoma of the right colon, status post a right colectomy 11/25/2010. K-ras wild-type.  He began adjuvant FOLFOX chemotherapy 12/31/2010. He completed cycle 12 of FOLFOX on 06/03/2011.  #2-PET scan 12/29/2010 with hypermetabolic retroperitoneal and anterior abdominal adenopathy, postoperative changes of the right colectomy with a possible seroma in the anterior abdomen. Patchy activity was noted in the parotid glands, potentially inflammatory in etiology.  -The hypermetabolic retroperitoneal lymph nodes potentially represented metastatic disease. We presented his case at the GI tumor conference and discussed the case with Dr. Dwain Sarna. A decision was made to proceed with "adjuvant" therapy with the plan for a restaging CT at a 3 to four-month interval.  -The restaging CT on 03/23/2011 revealed a decrease in the periaortic lymphadenopathy, no new adenopathy, and no evidence of liver metastases. This suggests the small retroperitoneal lymph nodes may represent metastatic colon cancer versus resolving "inflammatory" nodes.  -Restaging CT 07/13/2011 revealed increased retroperitoneal lymphadenopathy consistent with metastatic disease.  -Restaging PET scan 07/22/2011 confirmed persistent hypermetabolic retroperitoneal lymph nodes. -Restaging CT 11/12/2011 with a slight increase in the size of retroperitoneal/mesenteric lymph nodes, no other evidence of progressive metastatic disease  -Restaging CT 01/27/2012 with a slight increase in the size of a necrotic celiac node, slight decrease in the para-aortic nodes  -Status post palliative radiation to the abdominal/retroperitoneal lymph nodes completed on 04/04/2012  -Restaging CT 05/31/2012 with a slight decrease in the size of abdominal lymph  nodes and a stable mass adjacent to the SMA,? New left lung lesion  -Restaging CT 07/29/2012 with new mediastinal/hilar nodes and a new left lingula nodule. New retrocrural and retroperitoneal nodes.  -initiation of FOLFIRI/panitumumab on an every 2 week schedule 08/17/2012.  -restaging CT 10/19/2012 after 5 cycles of FOLFIRI/panitumumab with improvement in mediastinal adenopathy, a left lingular nodule, retroperitoneal nodes, and a stable node adjacent to the spleen.  -Cycle 10 of FOLFIRI/panitumumab on 12/21/2012  -Restaging CT 01/02/2013 with stable disease. -PET scan 01/27/2013 showed progression of metastatic disease with increased number and size of mesenteric, retroperitoneal, mediastinal and bilateral hilar lymph nodes. No extranodal metastatic disease was identified. 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 A basal cell carcinoma was recently removed from a right finger by Dr. Karlyn Agee.  #6-Left leg deep vein thrombosis confirmed on a Doppler ultrasound 01/28/2011, maintained on Coumadin. Coumadin is currently on hold for upcoming celiac plexus nerve block. 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 He has persistent mild numbness in the fingertips and toes. The numbness does not interfere with activity.  #8-Mild neutropenia on 03/11/2011, he received Neulasta following cycle #6 of FOLFOX. He also  received Neulasta support following cycle 8, cycle 10 and cycle #12.  #9-Thrombocytopenia secondary to chemotherapy.  #10-Low back pain-most likely related to the necrotic celiac node. He completed palliative radiation on 04/04/2012. The pain resolved. He recently presented with progressive back pain likely secondary to progression of retrocrural/retroperitoneal  adenopathy. The low back pain has resolved.  #11-Fever/rash on 07/29/2012-? contrast reaction.  #12-Clinical and CT evidence of a fractured Port-A-Cath 07/29/2012. The Port-A-Cath was removed on 08/04/2012. There was a linear laceration in the line. New Port-A-Cath placed on 08/15/2012.  #13-skin rash secondary to panitumumab-he will continue minocycline. The rash is better.  #14-abdominal pain beginning approximately 1 week after treatment occurring with cycles 7 and 8 FOLFIRI/Panitumumab, now persistent. Status post celiac plexus block 01/12/2013 with no improvement in pain. He is scheduled for a celiac plexus block in interventional radiology on 02/14/2013.  #15-Hypokalemia, maintained on a potassium supplement.  Disposition-Dr. Truett Perna reviewed the PET scan results as well as the images on the computer with Terry Vargas and his wife. He is considering enrollment on a clinical trial at [facility] specific to microsatellite high tumors. He has a call in to the research nurse at [facility] for more specifics. He will let our office know his schedule regarding initiation of treatment.  He continues to have significant abdominal pain. We will continue his current regimen of MS Contin with Dilaudid and oxycodone as needed for now. He is scheduled for the celiac plexus block in interventional radiology tomorrow. If he does not note improvement in pain within 48-72 hours of the procedure we will increase the MS Contin dose.  He will return for a followup visit on 03/06/2013. He will contact the office in the interim as outlined above or with any problems.    Patient seen with Dr. Truett Perna.  Lonna Cobb ANP/GNP-BC   This was a shared visit with Lonna Cobb. I discussed treatment options with Terry Vargas and his wife. I discussed the case with Dr. Maryruth Hancock today. The plan is to begin treatment on an MSI specific protocol at [facility].  He continues MS Contin and Dilaudid for pain. A radiologic guided celiac block is  scheduled for 02/14/2013. Terry Vargas will contact us if the pain does not improve following this procedure.  I reviewed the most recent PET scan with him today.  Mancel Bale, M.D.

## 2013-02-14 ENCOUNTER — Ambulatory Visit (HOSPITAL_COMMUNITY)
Admission: RE | Admit: 2013-02-14 | Discharge: 2013-02-14 | Disposition: A | Discharge status: Home / Self Care | Payer: BC Managed Care – PPO | Source: Ambulatory Visit | Attending: Oncology | Admitting: Oncology

## 2013-02-14 ENCOUNTER — Encounter (HOSPITAL_COMMUNITY): Payer: Self-pay

## 2013-02-14 DIAGNOSIS — C189 Malignant neoplasm of colon, unspecified: Secondary | ICD-10-CM | POA: Insufficient documentation

## 2013-02-14 DIAGNOSIS — C772 Secondary and unspecified malignant neoplasm of intra-abdominal lymph nodes: Secondary | ICD-10-CM | POA: Insufficient documentation

## 2013-02-14 DIAGNOSIS — Z86718 Personal history of other venous thrombosis and embolism: Secondary | ICD-10-CM | POA: Insufficient documentation

## 2013-02-14 DIAGNOSIS — R109 Unspecified abdominal pain: Secondary | ICD-10-CM | POA: Insufficient documentation

## 2013-02-14 LAB — CBC
MCV: 89.1 fL (ref 78.0–100.0)
Platelets: 181 10*3/uL (ref 150–400)
RDW: 12.9 % (ref 11.5–15.5)
WBC: 3.5 10*3/uL — ABNORMAL LOW (ref 4.0–10.5)

## 2013-02-14 LAB — PROTIME-INR: Prothrombin Time: 17.2 seconds — ABNORMAL HIGH (ref 11.6–15.2)

## 2013-02-14 LAB — APTT: aPTT: 41 seconds — ABNORMAL HIGH (ref 24–37)

## 2013-02-14 MED ORDER — FENTANYL CITRATE 0.05 MG/ML IJ SOLN
INTRAMUSCULAR | Status: AC
  Filled 2013-02-14: qty 6

## 2013-02-14 MED ORDER — MIDAZOLAM HCL 2 MG/2ML IJ SOLN
INTRAMUSCULAR | Status: AC
  Filled 2013-02-14: qty 6

## 2013-02-14 MED ORDER — HYDROCODONE-ACETAMINOPHEN 5-325 MG PO TABS
1.0000 | ORAL_TABLET | ORAL | Status: DC | PRN
  Filled 2013-02-14: qty 2

## 2013-02-14 MED ORDER — SODIUM CHLORIDE 0.9 % IV SOLN
INTRAVENOUS | Status: DC
  Administered 2013-02-14: 20 mL/h via INTRAVENOUS

## 2013-02-14 MED ORDER — MIDAZOLAM HCL 2 MG/2ML IJ SOLN
INTRAMUSCULAR | Status: AC | PRN
  Administered 2013-02-14 (×4): 2 mg via INTRAVENOUS

## 2013-02-14 MED ORDER — HEPARIN SOD (PORK) LOCK FLUSH 100 UNIT/ML IV SOLN
500.0000 [IU] | Freq: Once | INTRAVENOUS | Status: AC
  Administered 2013-02-14: 500 [IU] via INTRAVENOUS
  Filled 2013-02-14 (×2): qty 5

## 2013-02-14 MED ORDER — MIDAZOLAM HCL 2 MG/2ML IJ SOLN
INTRAMUSCULAR | Status: AC
  Filled 2013-02-14: qty 4

## 2013-02-14 MED ORDER — FENTANYL CITRATE 0.05 MG/ML IJ SOLN
INTRAMUSCULAR | Status: AC | PRN
  Administered 2013-02-14 (×2): 100 ug via INTRAVENOUS
  Administered 2013-02-14: 200 ug via INTRAVENOUS

## 2013-02-14 MED ORDER — BUPIVACAINE HCL 0.25 % IJ SOLN
30.0000 mL | Freq: Once | INTRAMUSCULAR | Status: DC
  Filled 2013-02-14: qty 30

## 2013-02-14 MED ORDER — FENTANYL CITRATE 0.05 MG/ML IJ SOLN
INTRAMUSCULAR | Status: AC
  Filled 2013-02-14: qty 4

## 2013-02-14 MED ORDER — IOHEXOL 300 MG/ML  SOLN
50.0000 mL | Freq: Once | INTRAMUSCULAR | Status: AC | PRN
  Administered 2013-02-14: 6 mL via INTRATHECAL

## 2013-02-14 MED ORDER — ALCOHOL 98 % IV SOLN
40.0000 mL | Freq: Once | INTRAVENOUS | Status: AC
  Administered 2013-02-14: 40 mL
  Filled 2013-02-14: qty 40

## 2013-02-14 MED ORDER — BUPIVACAINE HCL (PF) 0.25 % IJ SOLN
30.0000 mL | Freq: Once | INTRAMUSCULAR | Status: AC
  Administered 2013-02-14: 10 mL
  Filled 2013-02-14: qty 30

## 2013-02-14 NOTE — Procedures (Signed)
Celiac plexus block and neurolysis No complication No blood loss. See complete dictation in [facility] PACS.  

## 2013-02-14 NOTE — H&P (Signed)
Chief Complaint: "I am here for nerve block in my abdominal area." Referring Physician: Dr. Truett Perna HPI: Terry Vargas is an 52 y.o. male with metastatic colon cancer who has underwent endoscopic attempt of celiac plexus neurolysis for abdominal pain in august of this year. The patient states he did not get relief from that procedure and still continues to have abdominal pain. He has been seen in consult 02/02/13 by Dr. Deanne Coffer who has reviewed his PET-CT from 01/27/13 which revealed progression of mesenteric, retroperitoneal, and mediastinal adenopathy. It was discussed and decided that we would try image guided percutaneous approach for celiac plexus neurolysis today. The patient has been on coumadin for LE DVT and has stopped this since 9/25. He denies any active bleeding, blood in his stool or urine. He denies any fever or chills. He denies any chest pain or shortness of breath.   Past Medical History:  Past Medical History  Diagnosis Date  . Abdominal mass, RUQ (right upper quadrant)   . DVT (deep venous thrombosis) 01/28/11    left leg  . History of radiation therapy 03/14/12 - 04/04/12    retroperitoneal/abdominal region  . Colon cancer 12/02/2010    invasive adenocarcinoma, non-polyposis  . Skin cancer     squamous cell-L ear, basal cell- face/chest  . History of chemotherapy     LAST CHEMO 12-23-2012    Past Surgical History:  Past Surgical History  Procedure Laterality Date  . Mohs surgery  1996    left ear- squamous cell  . Open right colectomy  11/25/10    hemicolectomy w/appendectomy  . Tonsillectomy      as child  .  pot-a-cath insertion    . Skin cancer excision    . Port-a-cath removal Left 08/04/2012    Procedure: REMOVAL PORT-A-CATH;  Surgeon: Emelia Loron, MD;  Location: [facility] OR;  Service: General;  Laterality: Left;  . Portacath placement Left 08/15/2012    Procedure: INSERTION PORT-A-CATH;  Surgeon: Emelia Loron, MD;  Location:  SURGERY CENTER;   Service: General;  Laterality: Left;  . Eus N/A 01/12/2013    Procedure: UPPER ENDOSCOPIC ULTRASOUND (EUS) LINEAR;  Surgeon: Rachael Fee, MD;  Location: [facility] ENDOSCOPY;  Service: Endoscopy;  Laterality: N/A;  celiac plexus     Family History:  Family History  Problem Relation Age of Onset  . Colon cancer Paternal Grandmother   . Prostate cancer Paternal Grandfather   . Colon cancer Cousin 35    paternal  . Colon cancer Cousin 36    paternal  . Kidney cancer Father   . Colon cancer Father     55, 53  . Skin cancer Father     squamous cell-forehead, nose  . Cancer Father     kidney, colon    Social History:  reports that he has never smoked. He has never used smokeless tobacco. He reports that  drinks alcohol. He reports that he does not use illicit drugs.  Allergies: No Known Allergies    Medication List    ASK your doctor about these medications       acetaminophen 500 MG tablet  Commonly known as:  TYLENOL  Take 1,000 mg by mouth every 6 (six) hours as needed for pain.     cholecalciferol 1000 UNITS tablet  Commonly known as:  VITAMIN D  Take 1,000 Units by mouth daily.     HYDROmorphone 4 MG tablet  Commonly known as:  DILAUDID  Take 2-3 tablets (8-12 mg total) by  mouth every 4 (four) hours as needed for pain.     ibuprofen 200 MG tablet  Commonly known as:  ADVIL,MOTRIN  Take 200-400 mg by mouth every 6 (six) hours as needed for pain (abdominal pain).     KRILL OIL OMEGA-3 PO  Take 300 mg by mouth daily. Pt uses Mega Red Omega 3 Krill Oil     lidocaine-prilocaine cream  Commonly known as:  EMLA  Apply topically as needed. Apply to [facility] site one hour prior to injection.     morphine 60 MG 12 hr tablet  Commonly known as:  MS CONTIN  Take 1 tablet (60 mg total) by mouth 2 (two) times daily.     multivitamin tablet  Take 1 tablet by mouth daily.     oxyCODONE 5 MG immediate release tablet  Commonly known as:  Oxy IR/ROXICODONE  Take 1-2 tablets  (5-10 mg total) by mouth every 4 (four) hours as needed for pain.     potassium chloride SA 20 MEQ tablet  Commonly known as:  K-DUR,KLOR-CON  Take 1 tablet (20 mEq total) by mouth daily.     warfarin 4 MG tablet  Commonly known as:  COUMADIN  Take 2-4 mg by mouth daily. Takes 1 tablet every day except on Tues/Thurs/sat. Takes 1/2 tablet to equal 2 mg        Please HPI for pertinent positives, otherwise complete 10 system ROS negative.  Physical Exam: BP 134/84  Pulse 89  Temp(Src) 98.2 F (36.8 C) (Oral)  Resp 20  Ht 5\' 9"  (1.753 m)  Wt 145 lb (65.772 kg)  BMI 21.4 kg/m2  SpO2 98% Body mass index is 21.4 kg/(m^2).   General Appearance:  Alert, cooperative, no distress.  Head:  Normocephalic, without obvious abnormality, atraumatic  Neck: Supple, symmetrical, trachea midline  Lungs:   Clear to auscultation bilaterally, no w/r/r, respirations unlabored without use of accessory muscles.  Chest Wall:  No tenderness, port-a-catheter site intact without signs of infection.  Heart:  Regular rate and rhythm, S1, S2 normal, no murmur, rub or gallop.  Abdomen:   Soft, non-tender, non distended.  Extremities: Extremities normal, atraumatic, no cyanosis or edema  Pulses: 1+ and symmetric  Neurologic: Normal affect, no gross deficits.   Results for orders placed during the hospital encounter of 02/14/13 (from the past 48 hour(s))  APTT     Status: Abnormal   Collection Time    02/14/13  7:40 AM      Result Value Range   aPTT 41 (*) 24 - 37 seconds   Comment:            IF BASELINE aPTT IS ELEVATED,     SUGGEST PATIENT RISK ASSESSMENT     BE USED TO DETERMINE APPROPRIATE     ANTICOAGULANT THERAPY.  CBC     Status: Abnormal   Collection Time    02/14/13  7:40 AM      Result Value Range   WBC 3.5 (*) 4.0 - 10.5 K/uL   RBC 3.48 (*) 4.22 - 5.81 MIL/uL   Hemoglobin 10.4 (*) 13.0 - 17.0 g/dL   HCT 21.3 (*) 08.6 - 57.8 %   MCV 89.1  78.0 - 100.0 fL   MCH 29.9  26.0 - 34.0 pg    MCHC 33.5  30.0 - 36.0 g/dL   RDW 46.9  62.9 - 52.8 %   Platelets 181  150 - 400 K/uL  PROTIME-INR     Status: Abnormal  Collection Time    02/14/13  7:40 AM      Result Value Range   Prothrombin Time 17.2 (*) 11.6 - 15.2 seconds   INR 1.44  0.00 - 1.49   No results found.  Assessment/Plan Metastatic colon cancer with significant abdominal pain s/p endoscopic celiac plexus with no relief 12/2012. Seen in consult 02/02/13 by Dr. Deanne Coffer. Scheduled for image guided percutaneous celiac plexus neurolysis today. Labs reviewed, patient has been NPO. Risks and Benefits discussed with the patient. All of the patient's questions were answered, patient is agreeable to proceed. Consent signed and in chart.   Pattricia Boss D PA-C 02/14/2013, 9:02 AM

## 2013-02-16 ENCOUNTER — Telehealth: Payer: Self-pay | Admitting: *Deleted

## 2013-02-16 NOTE — Telephone Encounter (Signed)
Patient requesting from her assistance with understanding clinical trials and insurance coverage. Wishes to discuss trials more with physician at next visit. Case manager is available to assist as needed.

## 2013-02-20 ENCOUNTER — Telehealth: Payer: Self-pay | Admitting: *Deleted

## 2013-02-20 NOTE — Telephone Encounter (Signed)
Call from pt requesting copies of PET done 9/12 and CT done 8/18. Forwarded request to radiology. Terry Vargas will get disc ready for pt to pick up today. Pt made aware.

## 2013-02-21 ENCOUNTER — Other Ambulatory Visit: Payer: Self-pay | Admitting: Dermatology

## 2013-02-23 ENCOUNTER — Other Ambulatory Visit (HOSPITAL_BASED_OUTPATIENT_CLINIC_OR_DEPARTMENT_OTHER): Payer: BC Managed Care – PPO

## 2013-02-23 ENCOUNTER — Encounter (INDEPENDENT_AMBULATORY_CARE_PROVIDER_SITE_OTHER): Payer: Self-pay

## 2013-02-23 ENCOUNTER — Telehealth: Payer: Self-pay | Admitting: *Deleted

## 2013-02-23 ENCOUNTER — Ambulatory Visit (HOSPITAL_BASED_OUTPATIENT_CLINIC_OR_DEPARTMENT_OTHER): Payer: BC Managed Care – PPO | Admitting: Pharmacist

## 2013-02-23 DIAGNOSIS — I82409 Acute embolism and thrombosis of unspecified deep veins of unspecified lower extremity: Secondary | ICD-10-CM

## 2013-02-23 DIAGNOSIS — Z7901 Long term (current) use of anticoagulants: Secondary | ICD-10-CM

## 2013-02-23 LAB — PROTHROMBIN TIME: INR: 3.36 — ABNORMAL HIGH (ref <1.50)

## 2013-02-23 LAB — PROTIME-INR

## 2013-02-23 NOTE — Telephone Encounter (Signed)
Message from pt, met with Dr. Maryruth Hancock. Was told he will need to discontinue Coumadin. Pt requesting sooner appt with Dr. Truett Perna to "make an action plan" in order to get on study. Will review with MD for appt.

## 2013-02-23 NOTE — Progress Notes (Signed)
Pt seen in clinic today, but INR was sent out for confirmation. INR is 3.36. Decrease Coumadin to 4 mg daily except 2mg  on Mon and Thur.   Will check PT/INR on 03/06/13 at 11:15 am lab, 11:45 MD and 12:15 pm coumadin clinic. Patient concerned about waiting to see MD because he is anxious to talk with him about trial opportunity at [facility] (he met with them last week). Instructed patient to notify coumadin clinic if his MD appmt is changed, so we can see on same day. He states the MD at [facility] said he may need to come off coumadin if he is going to go on trial. This is to be discussed with Dr. Truett Perna and relayed to coumadin clinic with final decision.  No changes to report.  No bleeding.    Called patient to discuss INR and relay instructions as we discussed in clinic today.

## 2013-02-23 NOTE — Patient Instructions (Signed)
Decrease Coumadin to 4 mg daily except 2mg  on Mon and Thur.  Will check PT/INR on 03/06/13 at 11:15 am lab, 11:45 MD and 12:15 pm coumadin clinic. Call coumadin clinic to r/s appmt if your MD appmt gets moved up.

## 2013-02-24 ENCOUNTER — Ambulatory Visit: Payer: BC Managed Care – PPO | Admitting: Gastroenterology

## 2013-02-27 ENCOUNTER — Telehealth: Payer: Self-pay | Admitting: *Deleted

## 2013-02-27 NOTE — Telephone Encounter (Signed)
Per Dr. Truett Perna : May start Xarelto at daily dose now without seeing him. Feels [facility] may want the CT scan done there. Will work him in later in week if he still wishes. Spoke with Maisie Fus, and he reports [facility] called back and his scan is there on Wednesday at 0800. He asks if Dr. Truett Perna will still be able to refill his pain meds so he won't have to drive to [facility] for refills prn-reassured him we will continue to manage his pain locally and are willing to call in the Xarelto. He reports he took his last dose of Coumadin this morning and [facility] is taking care of ordering the Xarelto for him. He will call if there is a problem with this. He does need refill on MS Contin 60 mg and his hydromorphone 4 mg (still takes approx 10/day) for pain and would like to pick these up on Wednesday after his scan.

## 2013-02-27 NOTE — Telephone Encounter (Signed)
Does not want to wait until 10/20 to see Dr. Truett Perna and start to prepare for the trial at [facility] with Dr. Maryruth Hancock. Wants to see Dr. Truett Perna asap.  Noted documentation from Care Everywhere suggests baseline CT scan and stopping Coumadin with change to Xarelto or Lovenox (patient prefers Xarelto due to 0 copay) Trial is [facility] clinical trial ipilumumab +/- nivolumab.

## 2013-02-28 ENCOUNTER — Other Ambulatory Visit: Payer: Self-pay | Admitting: *Deleted

## 2013-02-28 ENCOUNTER — Encounter: Payer: Self-pay | Admitting: Oncology

## 2013-02-28 DIAGNOSIS — C189 Malignant neoplasm of colon, unspecified: Secondary | ICD-10-CM

## 2013-02-28 MED ORDER — MORPHINE SULFATE ER 60 MG PO TBCR: 60.0000 mg | EXTENDED_RELEASE_TABLET | Freq: Two times a day (BID) | ORAL | Status: DC

## 2013-02-28 MED ORDER — HYDROMORPHONE HCL 4 MG PO TABS: 8.0000 mg | ORAL_TABLET | ORAL | Status: DC | PRN

## 2013-03-03 ENCOUNTER — Telehealth: Payer: Self-pay | Admitting: *Deleted

## 2013-03-03 DIAGNOSIS — I82409 Acute embolism and thrombosis of unspecified deep veins of unspecified lower extremity: Secondary | ICD-10-CM

## 2013-03-03 DIAGNOSIS — C189 Malignant neoplasm of colon, unspecified: Secondary | ICD-10-CM

## 2013-03-03 NOTE — Telephone Encounter (Signed)
Needs to have CBC, PT, PTT drawn at 10/20 visit and faxed to her at [phone] for his biopsy on 03/08/13. Is currently off Coumadin and not on Lovenox either. Must be off his coumadin for 1 week pre biopsy. Has script for Xarelto and will begin this on day after biopsy. Confirmed that this can be done and orders put in system. Coumadin was stopped after his 10/13 morning dose.

## 2013-03-06 ENCOUNTER — Telehealth: Payer: Self-pay | Admitting: Oncology

## 2013-03-06 ENCOUNTER — Ambulatory Visit (HOSPITAL_BASED_OUTPATIENT_CLINIC_OR_DEPARTMENT_OTHER): Payer: BC Managed Care – PPO | Admitting: Oncology

## 2013-03-06 ENCOUNTER — Other Ambulatory Visit (HOSPITAL_BASED_OUTPATIENT_CLINIC_OR_DEPARTMENT_OTHER): Payer: BC Managed Care – PPO

## 2013-03-06 ENCOUNTER — Ambulatory Visit: Payer: BC Managed Care – PPO

## 2013-03-06 ENCOUNTER — Encounter: Payer: Self-pay | Admitting: Oncology

## 2013-03-06 VITALS — BP 138/85 | HR 78 | Temp 98.2°F | Resp 18 | Ht 69.0 in | Wt 140.2 lb

## 2013-03-06 DIAGNOSIS — I82409 Acute embolism and thrombosis of unspecified deep veins of unspecified lower extremity: Secondary | ICD-10-CM

## 2013-03-06 DIAGNOSIS — C778 Secondary and unspecified malignant neoplasm of lymph nodes of multiple regions: Secondary | ICD-10-CM

## 2013-03-06 DIAGNOSIS — R109 Unspecified abdominal pain: Secondary | ICD-10-CM

## 2013-03-06 DIAGNOSIS — C189 Malignant neoplasm of colon, unspecified: Secondary | ICD-10-CM

## 2013-03-06 DIAGNOSIS — C182 Malignant neoplasm of ascending colon: Secondary | ICD-10-CM

## 2013-03-06 LAB — PROTIME-INR: INR: 1.1 — ABNORMAL LOW (ref 2.00–3.50)

## 2013-03-06 LAB — CBC WITH DIFFERENTIAL/PLATELET
Basophils Absolute: 0 10*3/uL (ref 0.0–0.1)
Eosinophils Absolute: 0.1 10*3/uL (ref 0.0–0.5)
HCT: 30.4 % — ABNORMAL LOW (ref 38.4–49.9)
HGB: 10.1 g/dL — ABNORMAL LOW (ref 13.0–17.1)
MCH: 29.2 pg (ref 27.2–33.4)
MCV: 87.6 fL (ref 79.3–98.0)
MONO%: 9 % (ref 0.0–14.0)
NEUT#: 4.2 10*3/uL (ref 1.5–6.5)
NEUT%: 81 % — ABNORMAL HIGH (ref 39.0–75.0)
RBC: 3.47 10*6/uL — ABNORMAL LOW (ref 4.20–5.82)
RDW: 14.8 % — ABNORMAL HIGH (ref 11.0–14.6)
WBC: 5.2 10*3/uL (ref 4.0–10.3)
lymph#: 0.4 10*3/uL — ABNORMAL LOW (ref 0.9–3.3)

## 2013-03-06 NOTE — Telephone Encounter (Signed)
gv and printed appt sched and avs for pt for NOV...LT on pal on 11.12.14

## 2013-03-06 NOTE — Progress Notes (Signed)
Faxed CBC/PT/PTT to [facility] as requested at 5:30pm today [phone].

## 2013-03-06 NOTE — Progress Notes (Signed)
[facility]    OFFICE PROGRESS NOTE   INTERVAL HISTORY:   He returns for scheduled visit. He underwent a celiac block in interventional radiology on 02/14/2013. He noted partial improvement in the abdominal pain following the block. The pain is now worse with activity. The pain is also worse after eating. He is eating frequent small meals and reports gaining 3 pounds over the past several days. Mr. Retz he is scheduled to undergo a biopsy at the [facility] prior to beginning the clinical trial.  He is currently maintained off of anticoagulation in anticipation of the celiac block. He will begin anticoagulation with xarelto after the biopsy procedure. The [facility] protocol would not allow for Coumadin anticoagulation.  Objective:  Vital signs in last 24 hours:  Blood pressure 138/85, pulse 78, temperature 98.2 F (36.8 C), temperature source Oral, resp. rate 18, height 5\' 9"  (1.753 m), weight 140 lb 3.2 oz (63.594 kg), SpO2 100.00%.    HEENT: [place] over the tongue, no buccal thrush Resp: Lungs clear bilaterally Cardio: Regular rate and rhythm GI: No hepatomegaly, no mass Vascular: No leg edema, the left lower leg is slightly larger than the right side  Skin: Resolving rash over the trunk and extremities   Portacath/PICC-without erythema  Lab Results:  Lab Results  Component Value Date   WBC 5.2 03/06/2013   HGB 10.1* 03/06/2013   HCT 30.4* 03/06/2013   MCV 87.6 03/06/2013   PLT 191 03/06/2013   ANC 4.2   Medications: I have reviewed the patient's current medications.  Assessment/Plan: #1 .Stage III (T4, N2) adenocarcinoma of the right colon, status post a right colectomy 11/25/2010. K-ras wild-type.  He began adjuvant FOLFOX chemotherapy 12/31/2010. He completed cycle 12 of FOLFOX on 06/03/2011.  #2-PET scan 12/29/2010 with hypermetabolic retroperitoneal and anterior abdominal adenopathy, postoperative changes of the right colectomy with a possible seroma  in the anterior abdomen. Patchy activity was noted in the parotid glands, potentially inflammatory in etiology.  -The hypermetabolic retroperitoneal lymph nodes potentially represented metastatic disease. We presented his case at the GI tumor conference and discussed the case with Dr. Dwain Sarna. A decision was made to proceed with "adjuvant" therapy with the plan for a restaging CT at a 3 to four-month interval.  -The restaging CT on 03/23/2011 revealed a decrease in the periaortic lymphadenopathy, no new adenopathy, and no evidence of liver metastases. This suggests the small retroperitoneal lymph nodes may represent metastatic colon cancer versus resolving "inflammatory" nodes.  -Restaging CT 07/13/2011 revealed increased retroperitoneal lymphadenopathy consistent with metastatic disease.  -Restaging PET scan 07/22/2011 confirmed persistent hypermetabolic retroperitoneal lymph nodes. -Restaging CT 11/12/2011 with a slight increase in the size of retroperitoneal/mesenteric lymph nodes, no other evidence of progressive metastatic disease  -Restaging CT 01/27/2012 with a slight increase in the size of a necrotic celiac node, slight decrease in the para-aortic nodes  -Status post palliative radiation to the abdominal/retroperitoneal lymph nodes completed on 04/04/2012  -Restaging CT 05/31/2012 with a slight decrease in the size of abdominal lymph nodes and a stable mass adjacent to the SMA,? New left lung lesion  -Restaging CT 07/29/2012 with new mediastinal/hilar nodes and a new left lingula nodule. New retrocrural and retroperitoneal nodes.  -initiation of FOLFIRI/panitumumab on an every 2 week schedule 08/17/2012.  -restaging CT 10/19/2012 after 5 cycles of FOLFIRI/panitumumab with improvement in mediastinal adenopathy, a left lingular nodule, retroperitoneal nodes, and a stable node adjacent to the spleen.  -Cycle 10 of FOLFIRI/panitumumab on 12/21/2012  -Restaging CT 01/02/2013  with stable disease.    -PET scan 01/27/2013 showed progression of metastatic disease with increased number and size of mesenteric, retroperitoneal, mediastinal and bilateral hilar lymph nodes. No extranodal metastatic disease was identified.  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 A basal cell carcinoma was recently removed from a right finger by Dr. Karlyn Agee.  #6-Left leg deep vein thrombosis confirmed on a Doppler ultrasound 01/28/2011. Anticoagulation is currently on hold for the planned biopsy 03/08/2013. He will then begin xarelto. 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 He has persistent mild numbness in the fingertips and toes. The numbness does not interfere with activity.  #8-Mild neutropenia on 03/11/2011, he received Neulasta following cycle #6 of FOLFOX. He also received Neulasta support following cycle 8, cycle 10 and cycle #12.  #9-history of Thrombocytopenia secondary to chemotherapy.  #10-Low back pain-most likely related to the necrotic celiac node. He completed palliative radiation on 04/04/2012. The pain resolved. He recently presented with progressive back pain likely secondary to progression of retrocrural/retroperitoneal adenopathy. The low back pain has resolved.  #11-Fever/rash on 07/29/2012-? contrast reaction.  #12-Clinical and CT evidence of a fractured Port-A-Cath 07/29/2012. The Port-A-Cath was removed on 08/04/2012. There was a linear laceration in the line. New Port-A-Cath placed on 08/15/2012.  #13-skin rash secondary to panitumumab-improved #14-abdominal pain beginning approximately 1 week after treatment occurring with cycles 7 and 8 FOLFIRI/Panitumumab, now persistent. Status post celiac plexus block 01/12/2013 with no improvement in pain. He is  status post a celiac block in interventional radiology on 02/14/2013.   Disposition:  He continues to have abdominal pain. He will continue MS Contin and Dilaudid. Mr. Suarez is scheduled to undergo a biopsy procedure at [facility] on 03/08/2013. He will then begin treatment on study. He will return for an office visit here in 3 weeks. We will see him sooner as needed.   Thornton Papas, MD  03/06/2013  12:34 PM

## 2013-03-31 ENCOUNTER — Ambulatory Visit (HOSPITAL_BASED_OUTPATIENT_CLINIC_OR_DEPARTMENT_OTHER): Payer: BC Managed Care – PPO | Admitting: Nurse Practitioner

## 2013-03-31 ENCOUNTER — Telehealth: Payer: Self-pay | Admitting: Oncology

## 2013-03-31 VITALS — BP 116/68 | HR 64 | Temp 97.6°F | Resp 20 | Ht 69.0 in | Wt 137.5 lb

## 2013-03-31 DIAGNOSIS — Z86718 Personal history of other venous thrombosis and embolism: Secondary | ICD-10-CM

## 2013-03-31 DIAGNOSIS — Z7901 Long term (current) use of anticoagulants: Secondary | ICD-10-CM

## 2013-03-31 DIAGNOSIS — Z85828 Personal history of other malignant neoplasm of skin: Secondary | ICD-10-CM

## 2013-03-31 DIAGNOSIS — C189 Malignant neoplasm of colon, unspecified: Secondary | ICD-10-CM

## 2013-03-31 DIAGNOSIS — C182 Malignant neoplasm of ascending colon: Secondary | ICD-10-CM

## 2013-03-31 DIAGNOSIS — C778 Secondary and unspecified malignant neoplasm of lymph nodes of multiple regions: Secondary | ICD-10-CM

## 2013-03-31 NOTE — Telephone Encounter (Signed)
Pt did not want the 11:45 am appt on 12/12 emailed Misty Stanley, waiting for reply

## 2013-03-31 NOTE — Progress Notes (Addendum)
OFFICE PROGRESS NOTE  Interval history:  [name] returns for scheduled followup. He is currently being treated on a clinical trial at [facility]. He reports having 2 treatments thus far. Approximately 35 minutes into the first infusion he reports developing severe abdominal pain. He required overnight hospitalization. He had the second treatment last week. He did not experience any increased pain. At present he denies abdominal pain. He has discontinued Dilaudid. MS Contin has been decreased to 60 mg twice daily. Skin rash continues to fade. He denies any mouth sores. He notes diarrhea since beginning treatment. He is taking 8 tablets of Imodium a day. He estimates 4-5 bowel movements per day. Energy level is better. He is eating small frequent meals. He notes early satiety.   Objective: Blood pressure 116/68, pulse 64, temperature 97.6 F (36.4 C), temperature source Oral, resp. rate 20, height 5\' 9"  (1.753 m), weight 137 lb 8 oz (62.37 kg).  No thrush or ulcerations. Lungs are clear. Regular cardiac rhythm. Port-A-Cath site is without erythema. Abdomen is soft. No hepatomegaly. No mass. Trace lower leg edema bilaterally. Fading rash over the trunk and extremities.  Lab Results: Lab Results  Component Value Date   WBC 5.2 03/06/2013   HGB 10.1* 03/06/2013   HCT 30.4* 03/06/2013   MCV 87.6 03/06/2013   PLT 191 03/06/2013    Chemistry:    Chemistry      Component Value Date/Time   NA 139 12/21/2012 0829   NA 136 08/03/2012 0830   K 4.0 12/21/2012 0829   K 4.8 08/03/2012 0830   CL 108* 11/09/2012 0835   CL 97 08/03/2012 0830   CO2 25 12/21/2012 0829   CO2 30 08/03/2012 0830   BUN 9.1 12/21/2012 0829   BUN 12 08/03/2012 0830   CREATININE 0.8 12/21/2012 0829   CREATININE 0.84 08/03/2012 0830      Component Value Date/Time   CALCIUM 8.8 12/21/2012 0829   CALCIUM 9.2 08/03/2012 0830   ALKPHOS 75 12/21/2012 0829   ALKPHOS 56 10/16/2011 0745   AST 30 12/21/2012 0829   AST 38* 10/16/2011 0745   ALT 34  12/21/2012 0829   ALT 46 10/16/2011 0745   BILITOT 0.30 12/21/2012 0829   BILITOT 0.4 10/16/2011 0745       Studies/Results: No results found.  Medications: I have reviewed the patient's current medications.  Assessment/Plan:  #1 .Stage III (T4, N2) adenocarcinoma of the right colon, status post a right colectomy 11/25/2010. K-ras wild-type.  He began adjuvant FOLFOX chemotherapy 12/31/2010. He completed cycle 12 of FOLFOX on 06/03/2011.  #2-PET scan 12/29/2010 with hypermetabolic retroperitoneal and anterior abdominal adenopathy, postoperative changes of the right colectomy with a possible seroma in the anterior abdomen. Patchy activity was noted in the parotid glands, potentially inflammatory in etiology.  -The hypermetabolic retroperitoneal lymph nodes potentially represented metastatic disease. We presented his case at the GI tumor conference and discussed the case with Dr. Dwain Sarna. A decision was made to proceed with "adjuvant" therapy with the plan for a restaging CT at a 3 to four-month interval.  -The restaging CT on 03/23/2011 revealed a decrease in the periaortic lymphadenopathy, no new adenopathy, and no evidence of liver metastases. This suggests the small retroperitoneal lymph nodes may represent metastatic colon cancer versus resolving "inflammatory" nodes.  -Restaging CT 07/13/2011 revealed increased retroperitoneal lymphadenopathy consistent with metastatic disease.  -Restaging PET scan 07/22/2011 confirmed persistent hypermetabolic retroperitoneal lymph nodes. -Restaging CT 11/12/2011 with a slight increase in the size of retroperitoneal/mesenteric lymph nodes,  no other evidence of progressive metastatic disease  -Restaging CT 01/27/2012 with a slight increase in the size of a necrotic celiac node, slight decrease in the para-aortic nodes  -Status post palliative radiation to the abdominal/retroperitoneal lymph nodes completed on 04/04/2012  -Restaging CT 05/31/2012 with a slight  decrease in the size of abdominal lymph nodes and a stable mass adjacent to the SMA,? New left lung lesion  -Restaging CT 07/29/2012 with new mediastinal/hilar nodes and a new left lingula nodule. New retrocrural and retroperitoneal nodes.  -initiation of FOLFIRI/panitumumab on an every 2 week schedule 08/17/2012.  -restaging CT 10/19/2012 after 5 cycles of FOLFIRI/panitumumab with improvement in mediastinal adenopathy, a left lingular nodule, retroperitoneal nodes, and a stable node adjacent to the spleen.  -Cycle 10 of FOLFIRI/panitumumab on 12/21/2012  -Restaging CT 01/02/2013 with stable disease.  -PET scan 01/27/2013 showed progression of metastatic disease with increased number and size of mesenteric, retroperitoneal, mediastinal and bilateral hilar lymph nodes. No extranodal metastatic disease was identified.  -He recently began treatment on clinical trial with Nivolumab at [facility]. 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 A basal cell carcinoma was recently removed from a right finger by Dr. Karlyn Agee.  #6-Left leg deep vein thrombosis confirmed on a Doppler ultrasound 01/28/2011. Now on xarelto.  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 He has persistent mild numbness in the fingertips and toes. The numbness does not interfere with activity.  #8-Mild neutropenia on 03/11/2011, he received Neulasta following cycle #6 of FOLFOX. He also received Neulasta support following cycle 8, cycle 10 and cycle #12.  #9-history of Thrombocytopenia secondary to chemotherapy.  #10-Low back pain-most likely related to the necrotic celiac node. He completed palliative radiation on 04/04/2012. The pain resolved. He recently presented with progressive back pain likely secondary  to progression of retrocrural/retroperitoneal adenopathy. The low back pain has resolved.  #11-Fever/rash on 07/29/2012-? contrast reaction.  #12-Clinical and CT evidence of a fractured Port-A-Cath 07/29/2012. The Port-A-Cath was removed on 08/04/2012. There was a linear laceration in the line. New Port-A-Cath placed on 08/15/2012.  #13-skin rash secondary to panitumumab-improved  #14-abdominal pain beginning approximately 1 week after treatment occurring with cycles 7 and 8 FOLFIRI/Panitumumab, now persistent. Status post celiac plexus block 01/12/2013 with no improvement in pain. He is status post a celiac block in interventional radiology on 02/14/2013. At today's visit he denies abdominal pain. He is no longer requiring breakthrough pain medication. He has decreased the dose of MS Contin.  Disposition-he appears improved. He is no longer experiencing abdominal pain. We gave him instructions to decrease the MS Contin to 30 mg twice daily for 3-4 days and then decrease to 30 mg daily.  He is having diarrhea which he relates to treatment. Imodium is not effective. We recommended Lomotil. He will discuss with the providers at [facility] next week.  We scheduled a followup visit in 4 weeks but will be happy to see him sooner if needed.  Patient seen with Dr. Truett Perna.   25 minutes were spent face-to-face at today's visit with the majority of that time involved in counseling/coordination of care.   Lonna Cobb ANP/GNP-BC  This was a shared visit with Lonna Cobb.  Mr.Blankenbaker has experienced significant improvement in pain since beginning treatment on study. He will continue treatment on the clinical trial  at [facility].  Mancel Bale, M.D.

## 2013-04-02 ENCOUNTER — Encounter: Payer: Self-pay | Admitting: Oncology

## 2013-04-03 ENCOUNTER — Other Ambulatory Visit: Payer: Self-pay | Admitting: *Deleted

## 2013-04-20 ENCOUNTER — Encounter: Payer: Self-pay | Admitting: Oncology

## 2013-04-20 ENCOUNTER — Encounter: Payer: Self-pay | Admitting: Nurse Practitioner

## 2013-04-21 NOTE — Telephone Encounter (Signed)
INFORMED PT. THAT HIS MY CHART CT REPORT FROM [facility] WAS GIVEN TO DR.SHERRILL.

## 2013-04-21 NOTE — Telephone Encounter (Signed)
INFORMED PT. THAT HIS MY CHART CT REPORT FROM [facility] WAS GIVEN TO LISA Sonny,NP.

## 2013-04-28 ENCOUNTER — Ambulatory Visit: Payer: BC Managed Care – PPO | Admitting: Oncology

## 2013-04-28 ENCOUNTER — Ambulatory Visit: Payer: BC Managed Care – PPO | Admitting: Nurse Practitioner

## 2013-05-02 ENCOUNTER — Ambulatory Visit (HOSPITAL_BASED_OUTPATIENT_CLINIC_OR_DEPARTMENT_OTHER): Payer: BC Managed Care – PPO | Admitting: Oncology

## 2013-05-02 ENCOUNTER — Telehealth: Payer: Self-pay | Admitting: Oncology

## 2013-05-02 VITALS — BP 137/79 | HR 75 | Temp 98.7°F | Resp 18 | Ht 69.0 in | Wt 141.0 lb

## 2013-05-02 DIAGNOSIS — R197 Diarrhea, unspecified: Secondary | ICD-10-CM

## 2013-05-02 DIAGNOSIS — C189 Malignant neoplasm of colon, unspecified: Secondary | ICD-10-CM

## 2013-05-02 NOTE — Telephone Encounter (Signed)
gv pt appt schedule for february.  °

## 2013-05-02 NOTE — Progress Notes (Signed)
[facility]    OFFICE PROGRESS NOTE   INTERVAL HISTORY:   He returns for scheduled followup metastatic colon cancer. He continues treatment on protocol at [facility]. The abdomen and back pain have resolved. He is currently taking 15 mg of MS Contin daily. He takes no breakthrough pain medication. He is working and has started to exercise.  The skin rash is resolving. He has diarrhea multiple times per day despite Imodium and Lomotil.  Objective:  Vital signs in last 24 hours:  Blood pressure 137/79, pulse 75, temperature 98.7 F (37.1 C), temperature source Oral, resp. rate 18, height 5\' 9"  (1.753 m), weight 141 lb (63.957 kg).    HEENT: No thrush or ulcers Resp: Lungs clear bilaterally Cardio: Regular rate and rhythm GI: No hepatosplenomegaly, nontender, no mass Vascular: Trace edema at the left lower leg. No erythema  Skin: Resolving acneform rash over the trunk   Portacath/PICC-without erythema  X-rays: Restaging CTs of the chest, abdomen, and pelvis on 04/19/2013-decreased size of the peripancreatic mass, a decreased retroperitoneal lymphadenopathy, decreased left lower lobe nodule, no focal hepatic lesion   Medications: I have reviewed the patient's current medications.  Assessment/Plan: 1 .Stage III (T4, N2) adenocarcinoma of the right colon, status post a right colectomy 11/25/2010. K-ras wild-type.  He began adjuvant FOLFOX chemotherapy 12/31/2010. He completed cycle 12 of FOLFOX on 06/03/2011.  #2-PET scan 12/29/2010 with hypermetabolic retroperitoneal and anterior abdominal adenopathy, postoperative changes of the right colectomy with a possible seroma in the anterior abdomen. Patchy activity was noted in the parotid glands, potentially inflammatory in etiology.  -The hypermetabolic retroperitoneal lymph nodes potentially represented metastatic disease. We presented his case at the GI tumor conference and discussed the case with Dr. Dwain Sarna. A decision  was made to proceed with "adjuvant" therapy with the plan for a restaging CT at a 3 to four-month interval.  -The restaging CT on 03/23/2011 revealed a decrease in the periaortic lymphadenopathy, no new adenopathy, and no evidence of liver metastases. This suggests the small retroperitoneal lymph nodes may represent metastatic colon cancer versus resolving "inflammatory" nodes.  -Restaging CT 07/13/2011 revealed increased retroperitoneal lymphadenopathy consistent with metastatic disease.  -Restaging PET scan 07/22/2011 confirmed persistent hypermetabolic retroperitoneal lymph nodes. -Restaging CT 11/12/2011 with a slight increase in the size of retroperitoneal/mesenteric lymph nodes, no other evidence of progressive metastatic disease  -Restaging CT 01/27/2012 with a slight increase in the size of a necrotic celiac node, slight decrease in the para-aortic nodes  -Status post palliative radiation to the abdominal/retroperitoneal lymph nodes completed on 04/04/2012  -Restaging CT 05/31/2012 with a slight decrease in the size of abdominal lymph nodes and a stable mass adjacent to the SMA,? New left lung lesion  -Restaging CT 07/29/2012 with new mediastinal/hilar nodes and a new left lingula nodule. New retrocrural and retroperitoneal nodes.  -initiation of FOLFIRI/panitumumab on an every 2 week schedule 08/17/2012.  -restaging CT 10/19/2012 after 5 cycles of FOLFIRI/panitumumab with improvement in mediastinal adenopathy, a left lingular nodule, retroperitoneal nodes, and a stable node adjacent to the spleen.  -Cycle 10 of FOLFIRI/panitumumab on 12/21/2012  -Restaging CT 01/02/2013 with stable disease.  -PET scan 01/27/2013 showed progression of metastatic disease with increased number and size of mesenteric, retroperitoneal, mediastinal and bilateral hilar lymph nodes. No extranodal metastatic disease was identified.  -Currently being treated on protocol with Nivolumab at [facility] -Restaging CTs on  04/19/2013 confirmed a decreased left lung nodule, a decrease peripancreatic mass, and decreased retroperitoneal lymphadenopathy #3-Hereditary non-polyposis colon cancer  syndrome. Confirmed to have a mutation in the MLH1 gene on genetic testing.  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 A basal cell carcinoma was recently removed from a right finger by Dr. Karlyn Agee.  #6-Left leg deep vein thrombosis confirmed on a Doppler ultrasound 01/28/2011. Now on xarelto.  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 He has persistent mild numbness in the fingertips and toes. The numbness does not interfere with activity.  #8-Mild neutropenia on 03/11/2011, he received Neulasta following cycle #6 of FOLFOX. He also received Neulasta support following cycle 8, cycle 10 and cycle #12.  #9-history of Thrombocytopenia secondary to chemotherapy.  #10-Low back pain-most likely related to the necrotic celiac node. He completed palliative radiation on 04/04/2012. The pain resolved. He recently presented with progressive back pain likely secondary to progression of retrocrural/retroperitoneal adenopathy. The low back pain has resolved.  #11-Fever/rash on 07/29/2012-? contrast reaction.  #12-Clinical and CT evidence of a fractured Port-A-Cath 07/29/2012. The Port-A-Cath was removed on 08/04/2012. There was a linear laceration in the line. New Port-A-Cath placed on 08/15/2012.  #13-skin rash secondary to panitumumab-improved  #14-abdominal pain beginning approximately 1 week after treatment occurring with cycles 7 and 8 FOLFIRI/Panitumumab, now persistent. Status post celiac plexus block 01/12/2013 with no improvement in pain. He is status post a celiac block in interventional radiology on 02/14/2013. At today's visit he denies abdominal pain. He is no longer requiring breakthrough pain medication. He has  decreased the dose of MS Contin.  #15-diarrhea, likely secondary to Nivolumab    Disposition:  [name] appears well. He will see Dr. Maryruth Hancock on 05/03/2013 for treatment. He will discuss further tapering of the MS Contin with Dr. Maryruth Hancock. He continues to have significant diarrhea despite Imodium and Lomotil. I recommended she discuss tincture of opium with Dr. Maryruth Hancock.  He will return for an office visit here in 2 months. We are available to see him sooner as needed.   Thornton Papas, MD  05/02/2013  4:52 PM

## 2013-05-22 ENCOUNTER — Encounter: Payer: Self-pay | Admitting: Oncology

## 2013-05-22 ENCOUNTER — Other Ambulatory Visit: Payer: Self-pay | Admitting: *Deleted

## 2013-05-22 DIAGNOSIS — C189 Malignant neoplasm of colon, unspecified: Secondary | ICD-10-CM

## 2013-05-22 MED ORDER — OPIUM 10 MG/ML (1%) PO TINC: 0.8000 mL | Freq: Four times a day (QID) | ORAL | Status: DC | PRN

## 2013-05-22 NOTE — Telephone Encounter (Signed)
Received MyChart message asking if Opium Tincture can be ordered for him.  Patient confirms ghe is currently on this medicine and the [facility] protocol/Trial is aware.  Dr. Benay Spice signed order.  [name] will order with 24 hour delivery.  Called patient who will pick this up today.

## 2013-05-24 ENCOUNTER — Telehealth: Payer: Self-pay | Admitting: *Deleted

## 2013-05-24 DIAGNOSIS — C189 Malignant neoplasm of colon, unspecified: Secondary | ICD-10-CM

## 2013-05-24 NOTE — Telephone Encounter (Signed)
Call from [place] with [place] to clarify Tincture of Opium dose. 8 mg dose is greater than the 6 mg standard dosing. Dr. Benay Spice will be back in the office 1/8. Pharmacist requests to review with MD on call. Dr. Beryle Beams recommends clarifying with Dr. Benay Spice when back in the office. Benjamine Mola made aware.

## 2013-05-25 MED ORDER — OPIUM 10 MG/ML (1%) PO TINC: 0.8000 mL | Freq: Four times a day (QID) | ORAL | Status: DC | PRN

## 2013-05-25 MED ORDER — OPIUM 10 MG/ML (1%) PO TINC: 0.6000 mL | Freq: Four times a day (QID) | ORAL | Status: DC | PRN

## 2013-05-25 NOTE — Telephone Encounter (Signed)
Reviewed Tincture of Opium dosing with pt, he stated he was taking 6 mg (standard dose) initially and it was ineffective. Dose was increased by Dr. Reynaldo Minium. Per Dr. Benay Spice: OK to continue 8 mg dose. Pt picked up prescription on 05/24/13.

## 2013-06-27 ENCOUNTER — Ambulatory Visit (HOSPITAL_BASED_OUTPATIENT_CLINIC_OR_DEPARTMENT_OTHER): Payer: BC Managed Care – PPO | Admitting: Nurse Practitioner

## 2013-06-27 ENCOUNTER — Telehealth: Payer: Self-pay | Admitting: Oncology

## 2013-06-27 VITALS — BP 131/71 | HR 69 | Temp 97.1°F | Resp 18 | Ht 69.0 in | Wt 154.8 lb

## 2013-06-27 DIAGNOSIS — Z8589 Personal history of malignant neoplasm of other organs and systems: Secondary | ICD-10-CM

## 2013-06-27 DIAGNOSIS — C189 Malignant neoplasm of colon, unspecified: Secondary | ICD-10-CM

## 2013-06-27 DIAGNOSIS — C182 Malignant neoplasm of ascending colon: Secondary | ICD-10-CM

## 2013-06-27 DIAGNOSIS — C778 Secondary and unspecified malignant neoplasm of lymph nodes of multiple regions: Secondary | ICD-10-CM

## 2013-06-27 DIAGNOSIS — R197 Diarrhea, unspecified: Secondary | ICD-10-CM

## 2013-06-27 MED ORDER — OPIUM 10 MG/ML (1%) PO TINC: 0.8000 mL | Freq: Four times a day (QID) | ORAL | Status: DC | PRN

## 2013-06-27 NOTE — Telephone Encounter (Signed)
gv adn printed appt sched and avs for pt for April  °

## 2013-06-27 NOTE — Progress Notes (Signed)
OFFICE PROGRESS NOTE  Interval history:  Terry Vargas returns for followup of metastatic colon cancer. He continues treatment on protocol at [facility]. He denies pain. He is no longer taking pain medication. No nausea or vomiting. No mouth sores. Appetite is better. He is gaining weight. He continues to have diarrhea. He is taking Lomotil, Imodium and tincture of opium.   Objective: Filed Vitals:   06/27/13 1503  BP: 131/71  Pulse: 69  Temp: 97.1 F (36.2 C)  Resp: 18   No thrush or ulcerations. No palpable cervical, supraclavicular or axillary lymph nodes. Lungs are clear. No wheezes or rales. Regular cardiac rhythm. Abdomen soft and nontender. No organomegaly. No mass. Trace edema at the left lower leg.   Lab Results: Lab Results  Component Value Date   WBC 5.2 03/06/2013   HGB 10.1* 03/06/2013   HCT 30.4* 03/06/2013   MCV 87.6 03/06/2013   PLT 191 03/06/2013   NEUTROABS 4.2 03/06/2013    Chemistry:    Chemistry      Component Value Date/Time   NA 139 12/21/2012 0829   NA 136 08/03/2012 0830   K 4.0 12/21/2012 0829   K 4.8 08/03/2012 0830   CL 108* 11/09/2012 0835   CL 97 08/03/2012 0830   CO2 25 12/21/2012 0829   CO2 30 08/03/2012 0830   BUN 9.1 12/21/2012 0829   BUN 12 08/03/2012 0830   CREATININE 0.8 12/21/2012 0829   CREATININE 0.84 08/03/2012 0830      Component Value Date/Time   CALCIUM 8.8 12/21/2012 0829   CALCIUM 9.2 08/03/2012 0830   ALKPHOS 75 12/21/2012 0829   ALKPHOS 56 10/16/2011 0745   AST 30 12/21/2012 0829   AST 38* 10/16/2011 0745   ALT 34 12/21/2012 0829   ALT 46 10/16/2011 0745   BILITOT 0.30 12/21/2012 0829   BILITOT 0.4 10/16/2011 0745       Studies/Results: No results found.  Medications: I have reviewed the patient's current medications.  Assessment/Plan: 1 .Stage III (T4, N2) adenocarcinoma of the right colon, status post a right colectomy 11/25/2010. K-ras wild-type.  He began adjuvant FOLFOX chemotherapy 12/31/2010. He completed cycle 12 of FOLFOX on  06/03/2011.  #2-PET scan 12/29/2010 with hypermetabolic retroperitoneal and anterior abdominal adenopathy, postoperative changes of the right colectomy with a possible seroma in the anterior abdomen. Patchy activity was noted in the parotid glands, potentially inflammatory in etiology.  -The hypermetabolic retroperitoneal lymph nodes potentially represented metastatic disease. We presented his case at the GI tumor conference and discussed the case with Dr. Donne Hazel. A decision was made to proceed with "adjuvant" therapy with the plan for a restaging CT at a 3 to four-month interval.  -The restaging CT on 03/23/2011 revealed a decrease in the periaortic lymphadenopathy, no new adenopathy, and no evidence of liver metastases. This suggests the small retroperitoneal lymph nodes may represent metastatic colon cancer versus resolving "inflammatory" nodes.  -Restaging CT 07/13/2011 revealed increased retroperitoneal lymphadenopathy consistent with metastatic disease.  -Restaging PET scan 07/22/2011 confirmed persistent hypermetabolic retroperitoneal lymph nodes. -Restaging CT 11/12/2011 with a slight increase in the size of retroperitoneal/mesenteric lymph nodes, no other evidence of progressive metastatic disease  -Restaging CT 01/27/2012 with a slight increase in the size of a necrotic celiac node, slight decrease in the para-aortic nodes  -Status post palliative radiation to the abdominal/retroperitoneal lymph nodes completed on 04/04/2012  -Restaging CT 05/31/2012 with a slight decrease in the size of abdominal lymph nodes and a stable mass adjacent to the SMA,?  New left lung lesion  -Restaging CT 07/29/2012 with new mediastinal/hilar nodes and a new left lingula nodule. New retrocrural and retroperitoneal nodes.  -initiation of FOLFIRI/panitumumab on an every 2 week schedule 08/17/2012.  -restaging CT 10/19/2012 after 5 cycles of FOLFIRI/panitumumab with improvement in mediastinal adenopathy, a left  lingular nodule, retroperitoneal nodes, and a stable node adjacent to the spleen.  -Cycle 10 of FOLFIRI/panitumumab on 12/21/2012  -Restaging CT 01/02/2013 with stable disease.  -PET scan 01/27/2013 showed progression of metastatic disease with increased number and size of mesenteric, retroperitoneal, mediastinal and bilateral hilar lymph nodes. No extranodal metastatic disease was identified.  -Currently being treated on protocol with Nivolumab at [facility]  -Restaging CTs on 04/19/2013 confirmed a decreased left lung nodule, a decrease peripancreatic mass, and decreased retroperitoneal lymphadenopathy. - Restaging CTs on 05/31/2013 showed a slight decrease in abnormal peripancreatic soft tissue. No new metastatic disease in the chest, abdomen or pelvis. 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 A basal cell carcinoma was recently removed from a right finger by Dr. Wilhemina Bonito.  #6-Left leg deep vein thrombosis confirmed on a Doppler ultrasound 01/28/2011. Now on xarelto.  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 He has persistent mild numbness in the fingertips and toes. The numbness does not interfere with activity.  #8-Mild neutropenia on 03/11/2011, he received Neulasta following cycle #6 of FOLFOX. He also received Neulasta support following cycle 8, cycle 10 and cycle #12.  #9-History of thrombocytopenia secondary to chemotherapy.  #10-Low back pain-most likely related to the necrotic celiac node. He completed palliative radiation on 04/04/2012. The pain resolved. He recently presented with progressive back pain likely secondary to progression of retrocrural/retroperitoneal adenopathy. The low back pain has resolved.  #11-Fever/rash on 07/29/2012-? contrast reaction.   #12-Clinical and CT evidence of a fractured Port-A-Cath 07/29/2012. The Port-A-Cath was removed on 08/04/2012. There was a linear laceration in the line. New Port-A-Cath placed on 08/15/2012.  #13-Skin rash secondary to panitumumab-improved.  #14-Abdominal pain beginning approximately 1 week after treatment occurring with cycles 7 and 8 FOLFIRI/Panitumumab, now persistent. Status post celiac plexus block 01/12/2013 with no improvement in pain. He is status post a celiac block in interventional radiology on 02/14/2013. The pain has resolved. He has discontinued pain medication.  #15-Diarrhea, likely secondary to Nivolumab. The diarrhea is controlled with a combination of Lomotil, Imodium and tincture of opium.  Dispositon-he appears well. He continues treatment on study at [facility]. He will return for a followup visit in approximately 2 months.  Plan reviewed with Dr. Benay Spice.   Ned Card ANP/GNP-BC

## 2013-08-11 ENCOUNTER — Encounter: Payer: Self-pay | Admitting: Oncology

## 2013-08-14 ENCOUNTER — Encounter: Payer: Self-pay | Admitting: Nurse Practitioner

## 2013-08-15 ENCOUNTER — Other Ambulatory Visit: Payer: Self-pay | Admitting: *Deleted

## 2013-08-15 DIAGNOSIS — C189 Malignant neoplasm of colon, unspecified: Secondary | ICD-10-CM

## 2013-08-15 MED ORDER — OPIUM 10 MG/ML (1%) PO TINC: 0.8000 mL | Freq: Four times a day (QID) | ORAL | Status: DC | PRN

## 2013-08-15 NOTE — Telephone Encounter (Signed)
Request from patient received via MyChart for this refill.

## 2013-08-15 NOTE — Telephone Encounter (Signed)
Message from pt requesting to pick up refill on Tincture of Opium today. Medication refilled earlier today.

## 2013-08-29 ENCOUNTER — Telehealth: Payer: Self-pay | Admitting: Oncology

## 2013-08-29 ENCOUNTER — Ambulatory Visit (HOSPITAL_BASED_OUTPATIENT_CLINIC_OR_DEPARTMENT_OTHER): Payer: BC Managed Care – PPO | Admitting: Oncology

## 2013-08-29 VITALS — BP 114/72 | HR 58 | Temp 98.0°F | Resp 18 | Ht 69.0 in | Wt 159.4 lb

## 2013-08-29 DIAGNOSIS — C189 Malignant neoplasm of colon, unspecified: Secondary | ICD-10-CM

## 2013-08-29 DIAGNOSIS — C182 Malignant neoplasm of ascending colon: Secondary | ICD-10-CM

## 2013-08-29 DIAGNOSIS — R197 Diarrhea, unspecified: Secondary | ICD-10-CM

## 2013-08-29 DIAGNOSIS — C778 Secondary and unspecified malignant neoplasm of lymph nodes of multiple regions: Secondary | ICD-10-CM

## 2013-08-29 NOTE — Progress Notes (Signed)
[place] OFFICE PROGRESS NOTE   Diagnosis: Metastatic colon cancer  INTERVAL HISTORY:   Mr. Siek continues treatment on protocol at [facility]. He reports feeling well. He is working. No pain. He has intermittent diarrhea. The diarrhea varied with his diet. He is taking Imodium, Lomotil, and tincture of opium. On most days he has one or 2 bowel movements. He has noted a new mild pruritic rash over the trunk. A restaging CT 08/23/2013 revealed stable disease. The neuropathy symptoms are stable.  Objective:  Vital signs in last 24 hours:  Blood pressure 114/72, pulse 58, temperature 98 F (36.7 C), temperature source Oral, resp. rate 18, height 5' 9" (1.753 m), weight 159 lb 6.4 oz (72.303 kg), SpO2 100.00%.    HEENT: No thrush or ulcers Resp: Lungs clear bilaterally Cardio: Regular rate and rhythm GI: No hepatosplenomegaly, nontender, no mass Vascular: Trace edema at the left greater than right lower leg  Skin: Fine acne type rash over the trunk   Portacath/PICC-without erythema   Imaging:  No results found.  Medications: I have reviewed the patient's current medications.  Assessment/Plan: #1.Stage III (T4, N2) adenocarcinoma of the right colon, status post a right colectomy 11/25/2010. K-ras wild-type.  He began adjuvant FOLFOX chemotherapy 12/31/2010. He completed cycle 12 of FOLFOX on 06/03/2011.  #2-PET scan 12/29/2010 with hypermetabolic retroperitoneal and anterior abdominal adenopathy, postoperative changes of the right colectomy with a possible seroma in the anterior abdomen. Patchy activity was noted in the parotid glands, potentially inflammatory in etiology.  -The hypermetabolic retroperitoneal lymph nodes potentially represented metastatic disease. We presented his case at the GI tumor conference and discussed the case with Dr. Donne Hazel. A decision was made to proceed with "adjuvant" therapy with the plan for a restaging CT at a 3 to four-month  interval.  -The restaging CT on 03/23/2011 revealed a decrease in the periaortic lymphadenopathy, no new adenopathy, and no evidence of liver metastases. This suggests the small retroperitoneal lymph nodes may represent metastatic colon cancer versus resolving "inflammatory" nodes.  -Restaging CT 07/13/2011 revealed increased retroperitoneal lymphadenopathy consistent with metastatic disease.  -Restaging PET scan 07/22/2011 confirmed persistent hypermetabolic retroperitoneal lymph nodes. -Restaging CT 11/12/2011 with a slight increase in the size of retroperitoneal/mesenteric lymph nodes, no other evidence of progressive metastatic disease  -Restaging CT 01/27/2012 with a slight increase in the size of a necrotic celiac node, slight decrease in the para-aortic nodes  -Status post palliative radiation to the abdominal/retroperitoneal lymph nodes completed on 04/04/2012  -Restaging CT 05/31/2012 with a slight decrease in the size of abdominal lymph nodes and a stable mass adjacent to the SMA,? New left lung lesion  -Restaging CT 07/29/2012 with new mediastinal/hilar nodes and a new left lingula nodule. New retrocrural and retroperitoneal nodes.  -initiation of FOLFIRI/panitumumab on an every 2 week schedule 08/17/2012.  -restaging CT 10/19/2012 after 5 cycles of FOLFIRI/panitumumab with improvement in mediastinal adenopathy, a left lingular nodule, retroperitoneal nodes, and a stable node adjacent to the spleen.  -Cycle 10 of FOLFIRI/panitumumab on 12/21/2012  -Restaging CT 01/02/2013 with stable disease.  -PET scan 01/27/2013 showed progression of metastatic disease with increased number and size of mesenteric, retroperitoneal, mediastinal and bilateral hilar lymph nodes. No extranodal metastatic disease was identified.  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 A basal cell carcinoma was recently removed from a right finger by Dr. Wilhemina Bonito.  #6-Left leg deep vein thrombosis confirmed on a Doppler ultrasound 01/28/2011. Now on xarelto.  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 He has persistent mild numbness in the fingertips and toes. The numbness does not interfere with activity.  #8-Mild neutropenia on 03/11/2011, he received Neulasta following cycle #6 of FOLFOX. He also received Neulasta support following cycle 8, cycle 10 and cycle #12.  #9-history of Thrombocytopenia secondary to chemotherapy.  #10-Low back pain-most likely related to the necrotic celiac node. He completed palliative radiation on 04/04/2012. The pain resolved. He recently presented with progressive back pain likely secondary to progression of retrocrural/retroperitoneal adenopathy. The low back pain has resolved.  #11-Fever/rash on 07/29/2012-? contrast reaction.  #12-Clinical and CT evidence of a fractured Port-A-Cath 07/29/2012. The Port-A-Cath was removed on 08/04/2012. There was a linear laceration in the line. New Port-A-Cath placed on 08/15/2012.  #13-skin rash secondary to panitumumab-improved  #14-abdominal pain beginning approximately 1 week after treatment occurring with cycles 7 and 8 FOLFIRI/Panitumumab, now persistent. Status post celiac plexus block 01/12/2013 with no improvement in pain. He is status post a celiac block in interventional radiology on 02/14/2013. The pain has resolved. He is no longer taking pain medication. #15-diarrhea secondary to Nivolumab     Disposition:  He continues to gain weight and has an excellent performance status. He will wean the tincture of opium, Lomotil, and Imodium as tolerated. He continues close clinical followup with Dr. Reynaldo Minium. Mr. Shells will return for an office visit here in 3 months. We will see him sooner as needed.  Ladell Pier, MD  08/29/2013  4:28 PM

## 2013-08-29 NOTE — Telephone Encounter (Signed)
Gave pt appt for MD on July

## 2013-09-25 ENCOUNTER — Encounter: Payer: Self-pay | Admitting: Oncology

## 2013-09-25 ENCOUNTER — Telehealth: Payer: Self-pay | Admitting: *Deleted

## 2013-09-25 ENCOUNTER — Other Ambulatory Visit: Payer: Self-pay | Admitting: *Deleted

## 2013-09-25 DIAGNOSIS — C189 Malignant neoplasm of colon, unspecified: Secondary | ICD-10-CM

## 2013-09-25 MED ORDER — OPIUM 10 MG/ML (1%) PO TINC: 0.8000 mL | Freq: Four times a day (QID) | ORAL | Status: DC | PRN

## 2013-09-25 NOTE — Telephone Encounter (Signed)
Received email message from [facility] re: pt requested refill of Opium Tincture.  Last script was on 08/15/13.  Spoke with pt and was informed that pt has diarrhea sometimes 7 - 8 times a day.  Pt stated he has taken Lomotil, Imodium, and Opium Tincture to help with ongoing diarrhea problems. Pt understood he will be contacted when prescription is ready for pick up.

## 2013-09-25 NOTE — Telephone Encounter (Signed)
Spoke with pt again, and informed pt that prescription is ready for pt to pick up.  Pt stated he has appt at [facility] on Wed  10/04/13.  Instructed pt to notify his md at [facility] about the severity of diarrhea as per Dr. Gearldine Shown instructions.  Pt stated Dr. Reynaldo Minium is aware of diarrhea problem but he will mention again to md next week.

## 2013-11-13 ENCOUNTER — Other Ambulatory Visit: Payer: Self-pay | Admitting: *Deleted

## 2013-11-13 ENCOUNTER — Encounter: Payer: Self-pay | Admitting: Nurse Practitioner

## 2013-11-13 DIAGNOSIS — C189 Malignant neoplasm of colon, unspecified: Secondary | ICD-10-CM

## 2013-11-13 MED ORDER — OPIUM 10 MG/ML (1%) PO TINC: 0.8000 mL | Freq: Four times a day (QID) | ORAL | Status: DC | PRN

## 2013-11-24 ENCOUNTER — Telehealth: Payer: Self-pay | Admitting: Oncology

## 2013-11-24 ENCOUNTER — Ambulatory Visit (HOSPITAL_BASED_OUTPATIENT_CLINIC_OR_DEPARTMENT_OTHER): Payer: BC Managed Care – PPO | Admitting: Nurse Practitioner

## 2013-11-24 ENCOUNTER — Ambulatory Visit: Payer: BC Managed Care – PPO | Admitting: Oncology

## 2013-11-24 VITALS — BP 141/74 | HR 57 | Temp 98.2°F | Resp 18 | Ht 69.0 in | Wt 165.1 lb

## 2013-11-24 DIAGNOSIS — C182 Malignant neoplasm of ascending colon: Secondary | ICD-10-CM

## 2013-11-24 DIAGNOSIS — C778 Secondary and unspecified malignant neoplasm of lymph nodes of multiple regions: Secondary | ICD-10-CM

## 2013-11-24 DIAGNOSIS — C189 Malignant neoplasm of colon, unspecified: Secondary | ICD-10-CM

## 2013-11-24 DIAGNOSIS — R197 Diarrhea, unspecified: Secondary | ICD-10-CM

## 2013-11-24 DIAGNOSIS — I82409 Acute embolism and thrombosis of unspecified deep veins of unspecified lower extremity: Secondary | ICD-10-CM

## 2013-11-24 DIAGNOSIS — Z1509 Genetic susceptibility to other malignant neoplasm: Secondary | ICD-10-CM

## 2013-11-24 NOTE — Telephone Encounter (Signed)
Gave pt appt for Md appt for October 2015

## 2013-11-24 NOTE — Progress Notes (Signed)
[place] OFFICE PROGRESS NOTE   Diagnosis:  Colon cancer.  INTERVAL HISTORY:   He returns as scheduled. He continues treatment on protocol at [facility]. He feels well. He denies pain. He has a good appetite. He remains very active. No nausea or vomiting. He continues to have diarrhea. The diarrhea is "manageable" with a combination of Lomotil, Imodium and tincture of opium. Neuropathy symptoms are stable. No significant skin rash.  Objective:  Vital signs in last 24 hours:  Blood pressure 141/74, pulse 57, temperature 98.2 F (36.8 C), temperature source Oral, resp. rate 18, height _0  (1.753 m), weight 165 lb 1.6 oz (74.889 kg).    HEENT: No thrush or ulcerations. Resp: Lungs clear. Cardio: Regular cardiac rhythm. GI: Abdomen soft and nontender. No hepatomegaly. No mass. Vascular: Trace edema at the lower legs bilaterally left greater than right.  Port-A-Cath site without erythema.   Lab Results:  Lab Results  Component Value Date   WBC 5.2 03/06/2013   HGB 10.1* 03/06/2013   HCT 30.4* 03/06/2013   MCV 87.6 03/06/2013   PLT 191 03/06/2013   NEUTROABS 4.2 03/06/2013    Medications: I have reviewed the patient's current medications.  Assessment/Plan: #1.Stage III (T4, N2) adenocarcinoma of the right colon, status post a right colectomy 11/25/2010. K-ras wild-type.  He began adjuvant FOLFOX chemotherapy 12/31/2010. He completed cycle 12 of FOLFOX on 06/03/2011.  #2-PET scan 12/29/2010 with hypermetabolic retroperitoneal and anterior abdominal adenopathy, postoperative changes of the right colectomy with a possible seroma in the anterior abdomen. Patchy activity was noted in the parotid glands, potentially inflammatory in etiology.  -The hypermetabolic retroperitoneal lymph nodes potentially represented metastatic disease. We presented his case at the GI tumor conference and discussed the case with Dr. Donne Hazel. A decision was made to proceed with "adjuvant"  therapy with the plan for a restaging CT at a 3 to four-month interval.  -The restaging CT on 03/23/2011 revealed a decrease in the periaortic lymphadenopathy, no new adenopathy, and no evidence of liver metastases. This suggests the small retroperitoneal lymph nodes may represent metastatic colon cancer versus resolving "inflammatory" nodes.  -Restaging CT 07/13/2011 revealed increased retroperitoneal lymphadenopathy consistent with metastatic disease.  -Restaging PET scan 07/22/2011 confirmed persistent hypermetabolic retroperitoneal lymph nodes. -Restaging CT 11/12/2011 with a slight increase in the size of retroperitoneal/mesenteric lymph nodes, no other evidence of progressive metastatic disease  -Restaging CT 01/27/2012 with a slight increase in the size of a necrotic celiac node, slight decrease in the para-aortic nodes  -Status post palliative radiation to the abdominal/retroperitoneal lymph nodes completed on 04/04/2012  -Restaging CT 05/31/2012 with a slight decrease in the size of abdominal lymph nodes and a stable mass adjacent to the SMA,? New left lung lesion  -Restaging CT 07/29/2012 with new mediastinal/hilar nodes and a new left lingula nodule. New retrocrural and retroperitoneal nodes.  -initiation of FOLFIRI/panitumumab on an every 2 week schedule 08/17/2012.  -restaging CT 10/19/2012 after 5 cycles of FOLFIRI/panitumumab with improvement in mediastinal adenopathy, a left lingular nodule, retroperitoneal nodes, and a stable node adjacent to the spleen.  -Cycle 10 of FOLFIRI/panitumumab on 12/21/2012  -Restaging CT 01/02/2013 with stable disease.  -PET scan 01/27/2013 showed progression of metastatic disease with increased number and size of mesenteric, retroperitoneal, mediastinal and bilateral hilar lymph nodes. No extranodal metastatic disease was identified.  -Currently being treated on protocol with Nivolumab at [facility]  -Restaging CTs on 04/19/2013 confirmed a decreased left lung  nodule, a decrease peripancreatic mass, and decreased retroperitoneal  lymphadenopathy  -Continuation on every 2 week Nivolumab at [facility]  -Restaging CT 08/23/2013 with stable disease. -Restaging CT 11/15/2013 with stable disease.  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 A basal cell carcinoma was recently removed from a right finger by Dr. Wilhemina Bonito.  #6-Left leg deep vein thrombosis confirmed on a Doppler ultrasound 01/28/2011. Now on xarelto.  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 He has persistent mild numbness in the fingertips and toes. The numbness does not interfere with activity.  #8-Mild neutropenia on 03/11/2011, he received Neulasta following cycle #6 of FOLFOX. He also received Neulasta support following cycle 8, cycle 10 and cycle #12.  #9-history of Thrombocytopenia secondary to chemotherapy.  #10-Low back pain-most likely related to the necrotic celiac node. He completed palliative radiation on 04/04/2012. The pain resolved. He recently presented with progressive back pain likely secondary to progression of retrocrural/retroperitoneal adenopathy. The low back pain has resolved.  #11-Fever/rash on 07/29/2012-? contrast reaction.  #12-Clinical and CT evidence of a fractured Port-A-Cath 07/29/2012. The Port-A-Cath was removed on 08/04/2012. There was a linear laceration in the line. New Port-A-Cath placed on 08/15/2012.  #13-History of skin rash secondary to panitumumab. #14-Abdominal pain beginning approximately 1 week after treatment occurring with cycles 7 and 8 FOLFIRI/Panitumumab. Status post celiac plexus block 01/12/2013 with no improvement in pain. He is status post a celiac block in interventional radiology on 02/14/2013. The pain has  resolved. He is no longer taking pain medication.  #15-Diarrhea secondary to Nivolumab.     Disposition: He appears well. He continues treatment on protocol with Nivolumab at [facility]. Most recent restaging CT evaluation was stable.  We scheduled a return visit in 3 months. He will contact the office with any problems in the interim.  Plan reviewed with Dr. Benay Spice.    Jaquil, Todt ANP/GNP-BC   11/24/2013  4:42 PM

## 2014-01-12 ENCOUNTER — Encounter: Payer: Self-pay | Admitting: Oncology

## 2014-01-15 ENCOUNTER — Telehealth: Payer: Self-pay | Admitting: *Deleted

## 2014-01-15 ENCOUNTER — Encounter: Payer: Self-pay | Admitting: Oncology

## 2014-01-15 ENCOUNTER — Other Ambulatory Visit: Payer: Self-pay | Admitting: *Deleted

## 2014-01-15 DIAGNOSIS — C189 Malignant neoplasm of colon, unspecified: Secondary | ICD-10-CM

## 2014-01-15 MED ORDER — OPIUM 10 MG/ML (1%) PO TINC: 0.8000 mL | Freq: Four times a day (QID) | ORAL | Status: DC | PRN

## 2014-01-15 NOTE — Telephone Encounter (Signed)
Spoke with pt and informed pt that prescription for Opium Tincture refill is ready for pt to pick up.  Pt voiced understanding.

## 2014-01-16 ENCOUNTER — Other Ambulatory Visit: Payer: Self-pay | Admitting: Oncology

## 2014-02-20 ENCOUNTER — Ambulatory Visit (HOSPITAL_BASED_OUTPATIENT_CLINIC_OR_DEPARTMENT_OTHER): Payer: BC Managed Care – PPO | Admitting: Oncology

## 2014-02-20 ENCOUNTER — Telehealth: Payer: Self-pay | Admitting: Oncology

## 2014-02-20 VITALS — BP 119/73 | HR 57 | Temp 98.6°F | Resp 18 | Ht 69.0 in | Wt 164.3 lb

## 2014-02-20 DIAGNOSIS — C778 Secondary and unspecified malignant neoplasm of lymph nodes of multiple regions: Secondary | ICD-10-CM

## 2014-02-20 DIAGNOSIS — R197 Diarrhea, unspecified: Secondary | ICD-10-CM

## 2014-02-20 DIAGNOSIS — C189 Malignant neoplasm of colon, unspecified: Secondary | ICD-10-CM

## 2014-02-20 DIAGNOSIS — C182 Malignant neoplasm of ascending colon: Secondary | ICD-10-CM

## 2014-02-20 NOTE — Telephone Encounter (Signed)
gv pt appt schedule for feb 2016 °

## 2014-02-20 NOTE — Progress Notes (Signed)
[place] OFFICE PROGRESS NOTE   Diagnosis: Colon cancer  INTERVAL HISTORY:   Mr. Cherne returns as scheduled. He continues every 2 week nivolumab at [facility]. He feels well. He is working. No pain. The diarrhea has improved. He has decreased the tincture of opium to twice daily. The diarrhea is worse with certain foods. He continues to exercise. No change in the neuropathy symptoms at the soles of the feet. Restaging CTs at [facility] 02/07/2014 revealed stable soft tissue density encasing the celiac axis and superior mesenteric artery.  Objective:  Vital signs in last 24 hours:  Blood pressure 119/73, pulse 57, temperature 98.6 F (37 C), temperature source Oral, resp. rate 18, height 5' 9"  (1.753 m), weight 164 lb 4.8 oz (74.526 kg), SpO2 100.00%.    HEENT: No thrush or ulcers Resp: Lungs clear bilaterally Cardio: Regular rate and rhythm GI: No hepatomegaly, nontender, no mass Vascular: No leg edema  Skin: Mild acne type rash over the trunk   Portacath/PICC-without erythema   Medications: I have reviewed the patient's current medications.  Assessment/Plan: 1.Stage III (T4, N2) adenocarcinoma of the right colon, status post a right colectomy 11/25/2010. K-ras wild-type.  He began adjuvant FOLFOX chemotherapy 12/31/2010. He completed cycle 12 of FOLFOX on 06/03/2011.  #2-PET scan 12/29/2010 with hypermetabolic retroperitoneal and anterior abdominal adenopathy, postoperative changes of the right colectomy with a possible seroma in the anterior abdomen. Patchy activity was noted in the parotid glands, potentially inflammatory in etiology.  -The hypermetabolic retroperitoneal lymph nodes potentially represented metastatic disease. We presented his case at the GI tumor conference and discussed the case with Dr. Donne Hazel. A decision was made to proceed with "adjuvant" therapy with the plan for a restaging CT at a 3 to four-month interval.  -The restaging CT on 03/23/2011  revealed a decrease in the periaortic lymphadenopathy, no new adenopathy, and no evidence of liver metastases. This suggests the small retroperitoneal lymph nodes may represent metastatic colon cancer versus resolving "inflammatory" nodes.  -Restaging CT 07/13/2011 revealed increased retroperitoneal lymphadenopathy consistent with metastatic disease.  -Restaging PET scan 07/22/2011 confirmed persistent hypermetabolic retroperitoneal lymph nodes. -Restaging CT 11/12/2011 with a slight increase in the size of retroperitoneal/mesenteric lymph nodes, no other evidence of progressive metastatic disease  -Restaging CT 01/27/2012 with a slight increase in the size of a necrotic celiac node, slight decrease in the para-aortic nodes  -Status post palliative radiation to the abdominal/retroperitoneal lymph nodes completed on 04/04/2012  -Restaging CT 05/31/2012 with a slight decrease in the size of abdominal lymph nodes and a stable mass adjacent to the SMA,? New left lung lesion  -Restaging CT 07/29/2012 with new mediastinal/hilar nodes and a new left lingula nodule. New retrocrural and retroperitoneal nodes.  -initiation of FOLFIRI/panitumumab on an every 2 week schedule 08/17/2012.  -restaging CT 10/19/2012 after 5 cycles of FOLFIRI/panitumumab with improvement in mediastinal adenopathy, a left lingular nodule, retroperitoneal nodes, and a stable node adjacent to the spleen.  -Cycle 10 of FOLFIRI/panitumumab on 12/21/2012  -Restaging CT 01/02/2013 with stable disease.  -PET scan 01/27/2013 showed progression of metastatic disease with increased number and size of mesenteric, retroperitoneal, mediastinal and bilateral hilar lymph nodes. No extranodal metastatic disease was identified.  -Currently being treated on protocol with Nivolumab at [facility]  -Restaging CTs on 04/19/2013 confirmed a decreased left lung nodule, a decrease peripancreatic mass, and decreased retroperitoneal lymphadenopathy  -Continuation on  every 2 week Nivolumab at [facility]  -Restaging CT 08/23/2013 with stable disease.  -Restaging CT 11/15/2013 with stable  disease.  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 A basal cell carcinoma was recently removed from a right finger by Dr. Wilhemina Bonito.  #6-Left leg deep vein thrombosis confirmed on a Doppler ultrasound 01/28/2011. Now on xarelto.  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 He has persistent mild numbness in the fingertips and toes. The numbness does not interfere with activity.  #8-Mild neutropenia on 03/11/2011, he received Neulasta following cycle #6 of FOLFOX. He also received Neulasta support following cycle 8, cycle 10 and cycle #12.  #9-history of Thrombocytopenia secondary to chemotherapy.  #10-Low back pain-most likely related to the necrotic celiac node. He completed palliative radiation on 04/04/2012. The pain resolved. He recently presented with progressive back pain likely secondary to progression of retrocrural/retroperitoneal adenopathy. The low back pain has resolved.  #11-Fever/rash on 07/29/2012-? contrast reaction.  #12-Clinical and CT evidence of a fractured Port-A-Cath 07/29/2012. The Port-A-Cath was removed on 08/04/2012. There was a linear laceration in the line. New Port-A-Cath placed on 08/15/2012.  #13-History of skin rash secondary to panitumumab.  #14-Abdominal pain beginning approximately 1 week after treatment occurring with cycles 7 and 8 FOLFIRI/Panitumumab. Status post celiac plexus block 01/12/2013 with no improvement in pain. He is status post a celiac block in interventional radiology on 02/14/2013. The pain has resolved. He is no longer taking pain medication.  #15-Diarrhea secondary to Nivolumab.  Improved.   Disposition:  Terry Vargas continues treatment on the nivolumab study at [facility]. He appears stable. He will wean the Lomotil, Imodium, and tincture of opium as tolerated. He continues close clinical followup with Dr. Reynaldo Minium. He will discuss the indication for a surveillance colonoscopy with Dr. Reynaldo Minium.  He will return for an office visit here in 4 months. We will see him sooner as needed.  Betsy Coder, MD  02/20/2014  9:04 AM

## 2014-03-22 ENCOUNTER — Encounter: Payer: Self-pay | Admitting: Nurse Practitioner

## 2014-03-22 ENCOUNTER — Other Ambulatory Visit: Payer: Self-pay | Admitting: *Deleted

## 2014-03-22 ENCOUNTER — Encounter: Payer: Self-pay | Admitting: *Deleted

## 2014-03-22 DIAGNOSIS — C189 Malignant neoplasm of colon, unspecified: Secondary | ICD-10-CM

## 2014-03-22 MED ORDER — OPIUM 10 MG/ML (1%) PO TINC: 0.8000 mL | Freq: Four times a day (QID) | ORAL | Status: DC | PRN

## 2014-03-22 NOTE — Telephone Encounter (Signed)
03-22-2014 at 0901 patient sent the following message through patient portal read by this nurse at 12:15 pm.  "I need a prescription refill on the Opium Tincture ... 10 MG/ML ... 100 ML.    Thanks ... Delila Spence."  Will notify provider.

## 2014-03-28 ENCOUNTER — Other Ambulatory Visit: Payer: Self-pay | Admitting: Dermatology

## 2014-05-11 ENCOUNTER — Encounter: Payer: Self-pay | Admitting: Oncology

## 2014-05-14 ENCOUNTER — Encounter: Payer: Self-pay | Admitting: Nurse Practitioner

## 2014-05-15 ENCOUNTER — Encounter: Payer: Self-pay | Admitting: Oncology

## 2014-05-15 ENCOUNTER — Other Ambulatory Visit: Payer: Self-pay | Admitting: *Deleted

## 2014-05-15 DIAGNOSIS — C189 Malignant neoplasm of colon, unspecified: Secondary | ICD-10-CM

## 2014-05-15 MED ORDER — OPIUM 10 MG/ML (1%) PO TINC: 0.8000 mL | Freq: Four times a day (QID) | ORAL | Status: DC | PRN

## 2014-05-15 NOTE — Telephone Encounter (Signed)
Read and printed 05-15-2014 at 1745.  To providers desk for signature.

## 2014-06-19 ENCOUNTER — Telehealth: Payer: Self-pay | Admitting: Nurse Practitioner

## 2014-06-19 ENCOUNTER — Ambulatory Visit (HOSPITAL_BASED_OUTPATIENT_CLINIC_OR_DEPARTMENT_OTHER): Payer: BLUE CROSS/BLUE SHIELD | Admitting: Nurse Practitioner

## 2014-06-19 VITALS — BP 137/80 | HR 62 | Temp 97.6°F | Resp 18 | Ht 69.0 in | Wt 162.4 lb

## 2014-06-19 DIAGNOSIS — C189 Malignant neoplasm of colon, unspecified: Secondary | ICD-10-CM

## 2014-06-19 DIAGNOSIS — C19 Malignant neoplasm of rectosigmoid junction: Secondary | ICD-10-CM

## 2014-06-19 DIAGNOSIS — R911 Solitary pulmonary nodule: Secondary | ICD-10-CM

## 2014-06-19 DIAGNOSIS — C778 Secondary and unspecified malignant neoplasm of lymph nodes of multiple regions: Secondary | ICD-10-CM

## 2014-06-19 DIAGNOSIS — Z86718 Personal history of other venous thrombosis and embolism: Secondary | ICD-10-CM

## 2014-06-19 DIAGNOSIS — R197 Diarrhea, unspecified: Secondary | ICD-10-CM

## 2014-06-19 MED ORDER — OPIUM 10 MG/ML (1%) PO TINC: 0.8000 mL | Freq: Four times a day (QID) | ORAL | Status: DC | PRN

## 2014-06-19 NOTE — Progress Notes (Signed)
[place] OFFICE PROGRESS NOTE   Diagnosis:  Colon cancer  INTERVAL HISTORY:   [name] returns as scheduled. He continues every 2 week Nivolumab [facility]. He overall feels well. He has fatigue. He continues working full-time. He has a good appetite. No nausea or vomiting. Diarrhea is controlled with the combination of Imodium, Lomotil, tincture of opium and diet. He has a mild rash over the upper abdomen. He is applying a steroid cream. He denies pain.  Objective:  Vital signs in last 24 hours:  Blood pressure 137/80, pulse 62, temperature 97.6 F (36.4 C), temperature source Oral, resp. rate 18, height _0  (1.753 m), weight 162 lb 6.4 oz (73.664 kg), SpO2 100 %.    HEENT:  No thrush or ulcers. Resp:  Lungs clear bilaterally. Cardio:  Regular rate and rhythm. GI:  Abdomen soft and nontender. No mass. No hepatomegaly. Vascular:  No leg edema.  Skin:  Mild acne type rash over the trunk.  Port-A-Cath without erythema.    Lab Results:  Lab Results  Component Value Date   WBC 5.2 03/06/2013   HGB 10.1* 03/06/2013   HCT 30.4* 03/06/2013   MCV 87.6 03/06/2013   PLT 191 03/06/2013   NEUTROABS 4.2 03/06/2013    Imaging:  No results found.  Medications: I have reviewed the patient's current medications.  Assessment/Plan: 1.Stage III (T4, N2) adenocarcinoma of the right colon, status post a right colectomy 11/25/2010. K-ras wild-type.  He began adjuvant FOLFOX chemotherapy 12/31/2010. He completed cycle 12 of FOLFOX on 06/03/2011.  #2-PET scan 12/29/2010 with hypermetabolic retroperitoneal and anterior abdominal adenopathy, postoperative changes of the right colectomy with a possible seroma in the anterior abdomen. Patchy activity was noted in the parotid glands, potentially inflammatory in etiology.  -The hypermetabolic retroperitoneal lymph nodes potentially represented metastatic disease. We presented his case at the GI tumor conference and discussed the  case with Dr. Donne Hazel. A decision was made to proceed with "adjuvant" therapy with the plan for a restaging CT at a 3 to four-month interval.  -The restaging CT on 03/23/2011 revealed a decrease in the periaortic lymphadenopathy, no new adenopathy, and no evidence of liver metastases. This suggests the small retroperitoneal lymph nodes may represent metastatic colon cancer versus resolving "inflammatory" nodes.  -Restaging CT 07/13/2011 revealed increased retroperitoneal lymphadenopathy consistent with metastatic disease.  -Restaging PET scan 07/22/2011 confirmed persistent hypermetabolic retroperitoneal lymph nodes. -Restaging CT 11/12/2011 with a slight increase in the size of retroperitoneal/mesenteric lymph nodes, no other evidence of progressive metastatic disease  -Restaging CT 01/27/2012 with a slight increase in the size of a necrotic celiac node, slight decrease in the para-aortic nodes  -Status post palliative radiation to the abdominal/retroperitoneal lymph nodes completed on 04/04/2012  -Restaging CT 05/31/2012 with a slight decrease in the size of abdominal lymph nodes and a stable mass adjacent to the SMA,? New left lung lesion  -Restaging CT 07/29/2012 with new mediastinal/hilar nodes and a new left lingula nodule. New retrocrural and retroperitoneal nodes.  -initiation of FOLFIRI/panitumumab on an every 2 week schedule 08/17/2012.  -restaging CT 10/19/2012 after 5 cycles of FOLFIRI/panitumumab with improvement in mediastinal adenopathy, a left lingular nodule, retroperitoneal nodes, and a stable node adjacent to the spleen.  -Cycle 10 of FOLFIRI/panitumumab on 12/21/2012  -Restaging CT 01/02/2013 with stable disease.  -PET scan 01/27/2013 showed progression of metastatic disease with increased number and size of mesenteric, retroperitoneal, mediastinal and bilateral hilar lymph nodes. No extranodal metastatic disease was identified.  -Currently being treated  on protocol  with Nivolumab at [place] CTs on 04/19/2013 confirmed a decreased left lung nodule, a decrease peripancreatic mass, and decreased retroperitoneal lymphadenopathy  -Continuation on every 2 week Nivolumab at [facility]  -Restaging CT 08/23/2013 with stable disease.  -Restaging CT 11/15/2013 with stable disease.  -Restaging CT 02/07/2014 stable. -Restaging CT 05/02/2014 stable. 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 A basal cell carcinoma was recently removed from a right finger by Dr. Wilhemina Bonito.  #6-Left leg deep vein thrombosis confirmed on a Doppler ultrasound 01/28/2011. Now on xarelto.  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 He has persistent mild numbness in the fingertips and toes. The numbness does not interfere with activity.  #8-Mild neutropenia on 03/11/2011, he received Neulasta following cycle #6 of FOLFOX. He also received Neulasta support following cycle 8, cycle 10 and cycle #12.  #9-history of Thrombocytopenia secondary to chemotherapy.  #10-Low back pain-most likely related to the necrotic celiac node. He completed palliative radiation on 04/04/2012. The pain resolved. He recently presented with progressive back pain likely secondary to progression of retrocrural/retroperitoneal adenopathy. The low back pain has resolved.  #11-Fever/rash on 07/29/2012-? contrast reaction.  #12-Clinical and CT evidence of a fractured Port-A-Cath 07/29/2012. The Port-A-Cath was removed on 08/04/2012. There was a linear laceration in the line. New Port-A-Cath placed on 08/15/2012.  #13-History of skin rash secondary to panitumumab.  #14-Abdominal pain beginning approximately 1 week after treatment occurring with cycles 7 and 8  FOLFIRI/Panitumumab. Status post celiac plexus block 01/12/2013 with no improvement in pain. He is status post a celiac block in interventional radiology on 02/14/2013. The pain has resolved. He is no longer taking pain medication.  #15-Diarrhea secondary to Nivolumab. Improved.  Disposition: Terry Vargas appears well. He continues treatment on the Nivolumab study at [facility]. He is followed closely by Dr. Reynaldo Minium. We scheduled a return visit here in 4 months. He will contact the office in the interim with any problems.   He was provided with a new prescription for tincture of opium at today's visit.  Plan reviewed with Dr. Benay Spice.  Terry Vargas, Terry Vargas ANP/GNP-BC   06/19/2014  3:37 PM

## 2014-06-19 NOTE — Telephone Encounter (Signed)
Pt confirmed MD visit per 02/02 POF, gave pt AVS... KJ

## 2014-07-26 ENCOUNTER — Encounter: Payer: Self-pay | Admitting: Physician Assistant

## 2014-08-06 ENCOUNTER — Encounter: Payer: Self-pay | Admitting: Physician Assistant

## 2014-08-06 ENCOUNTER — Ambulatory Visit (INDEPENDENT_AMBULATORY_CARE_PROVIDER_SITE_OTHER): Payer: BLUE CROSS/BLUE SHIELD | Admitting: Physician Assistant

## 2014-08-06 ENCOUNTER — Telehealth: Payer: Self-pay | Admitting: *Deleted

## 2014-08-06 VITALS — BP 102/70 | HR 60 | Ht 69.0 in | Wt 159.6 lb

## 2014-08-06 DIAGNOSIS — Z85038 Personal history of other malignant neoplasm of large intestine: Secondary | ICD-10-CM

## 2014-08-06 DIAGNOSIS — Z1509 Genetic susceptibility to other malignant neoplasm: Secondary | ICD-10-CM

## 2014-08-06 DIAGNOSIS — D509 Iron deficiency anemia, unspecified: Secondary | ICD-10-CM

## 2014-08-06 MED ORDER — MOVIPREP 100 G PO SOLR: 1.0000 | ORAL | Status: DC

## 2014-08-06 NOTE — Telephone Encounter (Signed)
RE: Terry Vargas DOB: July 30, 1960 MRN: [id number]   Dear Dr. Catha Nottingham,    We have scheduled the above patient for an endoscopic procedure. Our records show that he is on anticoagulation therapy.   Please advise as to how long the patient may come off his therapy of Xarelto prior to the procedure, which is scheduled for 09-03-2014.  Please fax back/ or route the completed form to [facility] at [phone].   Sincerely,    Amy esterwood PA-C

## 2014-08-06 NOTE — Patient Instructions (Addendum)
Please go to the basement level to our lab for stool studies. You have been scheduled for an endoscopy and colonoscopy. Please follow the written instructions given to you at your visit today. Please pick up your prep supplies at the pharmacy within the next 1-3 days. Terry Vargas If you use inhalers (even only as needed), please bring them with you on the day of your procedure. Your physician has requested that you go to www.startemmi.com and enter the access code given to you at your visit today. This web site gives a general overview about your procedure. However, you should still follow specific instructions given to you by our office regarding your preparation for the procedure.

## 2014-08-06 NOTE — Progress Notes (Signed)
Patient ID: Terry Vargas, male   DOB: 06-03-1960, 54 y.o.   MRN: [id number]   Subjective:    Patient ID: Terry Vargas, male    DOB: [birth year], 54 y.o.   MRN: [id number]  HPI Terry Vargas is a very nice 54 year old white male known to Dr. Ardis Hughs and currently being followed by Dr. Clelia Croft [facility] oncology in addition to Dr. Julieanne Manson. Patient had undergone colonoscopy in July 2012 and at that time was diagnosed with a nearly obstructing right colon cancer. He underwent right hemicolectomy and his tumor was stage III (T4 N2).. Tumor was adherent to the duodenum and pancreas. And had positive lymphovascular invasion. He underwent chemotherapy and then had palliative radiation in November 2013 2 persistent abdominal and retroperitoneal lymph nodes. He continued with chemotherapy and in 2014 on PET scan had progression of metastatic disease with increased number and size of the mesenteric retroperitoneal mediastinal and bilateral lymph nodes. He was seen by Dr. Ardis Hughs in August 2014 for attempted a celiac plexus block for persistent abdominal pain felt secondary to a necrotic lymph node. The block was unsuccessful for pain control. Terry Vargas In October 2014 he began in a clinical trial  With Nivolumab  which the patient says has  been very successful in helping to control his abdominal pain. He continues on treatment/ [employer]. Most recent CT in March 2016 chest abdomen and pelvis showed grossly stable appearance of soft tissue density encasing the celiac and SMA without occlusion. He has also recently been noted to be iron deficient and his hemoglobin has drifted a bit. He has received an iron infusion and is referred here for endoscopic evaluation. Patient is on Xarelto 4 history of a DVT postoperatively in 2012. He has no complaints of changes in his bowel habits. He has had chronic problems with diarrhea attributed to the Nivolumab ,and this is being controlled with Lomotil and  tincture of opium. He says he has been a bit more for fatigued over the past few months but continues to work full-time. He has had some heartburn in the past few days has no complaint of dysphagia or odynophagia no ongoing reflux symptoms no nausea or vomiting appetite has been fine. He has not noted any melena or hematochezia.  Review of Systems Pertinent positive and negative review of systems were noted in the above HPI section.  All other review of systems was otherwise negative.  Outpatient Encounter Prescriptions as of 08/06/2014  Medication Sig  . acetaminophen (TYLENOL) 500 MG tablet Take 1,000 mg by mouth every 6 (six) hours as needed for pain.   Marland Kitchen amLODipine (NORVASC) 5 MG tablet Take by mouth.  Marland Kitchen augmented betamethasone dipropionate (DIPROLENE-AF) 0.05 % ointment Apply topically 2 (two) times daily.  . cholecalciferol (VITAMIN D) 1000 UNITS tablet Take 1,000 Units by mouth daily.  . diphenhydrAMINE (BENADRYL) 25 MG tablet Take 25 mg by mouth daily.  . diphenoxylate-atropine (LOMOTIL) 2.5-0.025 MG per tablet Take 1 tablet by mouth 3 (three) times daily.   Marland Kitchen KRILL OIL OMEGA-3 PO Take 300 mg by mouth daily. Pt uses Mega Red Omega 3 Krill Oil  . lidocaine-prilocaine (EMLA) cream APPLY TOPICALLY TO PAC SITE ONE HOUR PRIOR TO INJECTION AS NEEDED  . loperamide (IMODIUM) 2 MG capsule Take 2 mg by mouth.  . Multiple Vitamin (MULTIVITAMIN) tablet Take 1 tablet by mouth daily.  Marland Kitchen Opium 10 MG/ML (1%) TINC Take 0.8 mLs (8 mg total) by mouth every 6 (six) hours as needed for diarrhea  or loose stools.  . Rivaroxaban (XARELTO) 20 MG TABS tablet Take 20 mg by mouth daily.  Marland Kitchen MOVIPREP 100 G SOLR Take 1 kit (200 g total) by mouth as directed.   No Known Allergies Patient Active Problem List   Diagnosis Date Noted  . Heart murmur   . History of radiation therapy   . 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 Skin cancer   . DVT (deep venous  thrombosis) 09/23/2011  . Hereditary nonpolyposis colon cancer 03/15/2011  . DVT of leg (deep venous thrombosis) 01/28/2011  . Colon cancer 12/02/2010   History   Social History  . Marital Status: Married    Spouse Name: N/A  . Number of Children: N/A  . Years of Education: N/A   Occupational History  . Not on file.   Social History Main Topics  . Smoking status: Never Smoker   . Smokeless tobacco: Never Used  . Alcohol Use: 0.0 oz/week    0 Standard drinks or equivalent per week     Comment: 1-2 drinks daily  . Drug Use: No  . Sexual Activity: Not on file   Other Topics Concern  . Not on file   Social History Narrative    [name]'s family history includes Colon cancer in his father, paternal aunt, and paternal grandmother; Colon cancer (age of onset: 61) in his cousin; Colon cancer (age of onset: 32) in his cousin; Kidney cancer in his father; Prostate cancer in his paternal grandfather; Skin cancer in his father.      Objective:    Filed Vitals:   08/06/14 0928  BP: 102/70  Pulse: 60    Physical Exam  well-developed white male in no acute distress, pleasant blood pressure 102/70 pulse 60 height 5 foot 9 weight 159. HEENT; nontraumatic normocephalic EOMI PERRLA sclera anicteric, Supple ;no JVD, Cardiovascular; regular rate and rhythm with S1-S2 no murmur or gallop, Pulmonary; clear bilaterally Abdomen ;soft ,nontender, nondistended, bowel sounds are present he has a midline incisional scar, no palpable mass or hepatosplenomegaly, Rectal; exam not done, Extremities; no clubbing cyanosis or edema skin warm and dry, Psych ;mood and affect appropriate       Assessment & Plan:   [age] yo male with Stage IV  Colon cancer - initially dx 2012 , and underwent right hemicolectomy.Pt has had ongoing treatment since and is currently in a clinical trial at [facility]. with Nivolumab  Therapy . (Dr. Burt Ek) #2 Terry Vargas syndrome (MLH1 gene ) #3 new iron deficiency anemia-r/o  occult blood loss #4 hx of post op DVT #5 chronic anticoagulation-on Xarelto #6 chronic diarrhea- associated with Nivolumab   Plan; Will schedule for EGD and Colonoscopy with Dr. Ardis Hughs . Procedures discussed in detail with pt and he is agreeable to proceed Will obtain consent from Dr. Benay Spice to hold Xarelto for 24 hours prior to procedure.  If above unrevealing will need Capsule endoscopy Check Hemosure to document heme positive     Amy S Esterwood PA-C 08/06/2014  Cc: Dr Mitzi Hansen  Cc: Aura Dials, MD

## 2014-08-06 NOTE — Progress Notes (Signed)
i agree with the above note, plan 

## 2014-08-08 NOTE — Telephone Encounter (Signed)
Okay to stop xarelto for the procedure, resumed day of procedure if no biopsy is performed

## 2014-08-09 NOTE — Telephone Encounter (Signed)
Spoke to the patient and advised him to stop the Xarelto on 4-16 and resume it on the day of the procedure 4-18 if no biopsies, ask Dr. Ardis Hughs when to resume if he has had biopsies.  Patient verbalized understanding instructions.  He told me one of his doctors put him on a slow release iron. I told him to advise that MD he is having a colonoscopy and ask for instructions regarding if he should hold it prior to the procedure.

## 2014-08-14 ENCOUNTER — Other Ambulatory Visit (INDEPENDENT_AMBULATORY_CARE_PROVIDER_SITE_OTHER): Payer: BLUE CROSS/BLUE SHIELD

## 2014-08-14 DIAGNOSIS — Z1509 Genetic susceptibility to other malignant neoplasm: Secondary | ICD-10-CM

## 2014-08-14 DIAGNOSIS — D509 Iron deficiency anemia, unspecified: Secondary | ICD-10-CM

## 2014-08-14 DIAGNOSIS — Z85038 Personal history of other malignant neoplasm of large intestine: Secondary | ICD-10-CM | POA: Diagnosis not present

## 2014-08-15 LAB — FECAL OCCULT BLOOD, IMMUNOCHEMICAL: FECAL OCCULT BLD: NEGATIVE

## 2014-08-17 ENCOUNTER — Other Ambulatory Visit: Payer: Self-pay

## 2014-08-22 ENCOUNTER — Encounter: Payer: Self-pay | Admitting: Gastroenterology

## 2014-09-03 ENCOUNTER — Ambulatory Visit (AMBULATORY_SURGERY_CENTER): Payer: BLUE CROSS/BLUE SHIELD | Admitting: Gastroenterology

## 2014-09-03 ENCOUNTER — Telehealth: Payer: Self-pay

## 2014-09-03 ENCOUNTER — Encounter: Payer: Self-pay | Admitting: Gastroenterology

## 2014-09-03 VITALS — BP 98/60 | HR 71 | Temp 99.0°F | Resp 22 | Ht 69.0 in | Wt 159.0 lb

## 2014-09-03 DIAGNOSIS — Z85038 Personal history of other malignant neoplasm of large intestine: Secondary | ICD-10-CM

## 2014-09-03 DIAGNOSIS — D509 Iron deficiency anemia, unspecified: Secondary | ICD-10-CM

## 2014-09-03 DIAGNOSIS — K269 Duodenal ulcer, unspecified as acute or chronic, without hemorrhage or perforation: Secondary | ICD-10-CM | POA: Diagnosis not present

## 2014-09-03 DIAGNOSIS — K315 Obstruction of duodenum: Secondary | ICD-10-CM | POA: Diagnosis not present

## 2014-09-03 DIAGNOSIS — D128 Benign neoplasm of rectum: Secondary | ICD-10-CM

## 2014-09-03 MED ORDER — SODIUM CHLORIDE 0.9 % IV SOLN: 500.0000 mL | INTRAVENOUS | Status: DC

## 2014-09-03 NOTE — Telephone Encounter (Signed)
-----   Message from Milus Banister, MD sent at 09/03/2014  2:00 PM EDT ----- Chong Sicilian, Can you send copies of todays colonsocopy and EGD to Dr. Burt Ek at [facility].  Leroy Sea, See the full procedure notes in EPIC. 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 This is probably in the location were he was known to have spread of tumor (panc, duodenum) at original resection I believe.  Don't have any recent scans for him.  I'll let you knwo about final path and will keep [facility] in the loop as well.  Thanks

## 2014-09-03 NOTE — Progress Notes (Signed)
To recovery, report to Hod[place], RN, VSS 

## 2014-09-03 NOTE — Progress Notes (Signed)
Called to room to assist during endoscopic procedure.  Patient ID and intended procedure confirmed with present staff. Received instructions for my participation in the procedure from the performing physician.  

## 2014-09-03 NOTE — Patient Instructions (Signed)
YOU HAD AN ENDOSCOPIC PROCEDURE TODAY AT [facility]:   Refer to the procedure report that was given to you for any specific questions about what was found during the examination.  If the procedure report does not answer your questions, please call your gastroenterologist to clarify.  If you requested that your care partner not be given the details of your procedure findings, then the procedure report has been included in a sealed envelope for you to review at your convenience later.  YOU SHOULD EXPECT: Some feelings of bloating in the abdomen. Passage of more gas than usual.  Walking can help get rid of the air that was put into your GI tract during the procedure and reduce the bloating. If you had a lower endoscopy (such as a colonoscopy or flexible sigmoidoscopy) you may notice spotting of blood in your stool or on the toilet paper. If you underwent a bowel prep for your procedure, you may not have a normal bowel movement for a few days.  Please Note:  You might notice some irritation and congestion in your nose or some drainage.  This is from the oxygen used during your procedure.  There is no need for concern and it should clear up in a day or so.  SYMPTOMS TO REPORT IMMEDIATELY:   Following lower endoscopy (colonoscopy or flexible sigmoidoscopy):  Excessive amounts of blood in the stool  Significant tenderness or worsening of abdominal pains  Swelling of the abdomen that is new, acute  Fever of 100F or higher   Following upper endoscopy (EGD)  Vomiting of blood or coffee ground material  New chest pain or pain under the shoulder blades  Painful or persistently difficult swallowing  New shortness of breath  Fever of 100F or higher  Black, tarry-looking stools  For urgent or emergent issues, a gastroenterologist can be reached at any hour by calling [phone].   DIET: Your first meal following the procedure should be a small meal and then it is ok to progress to  your normal diet. Heavy or fried foods are harder to digest and may make you feel nauseous or bloated.  Likewise, meals heavy in dairy and vegetables can increase bloating.  Drink plenty of fluids but you should avoid alcoholic beverages for 24 hours.  ACTIVITY:  You should plan to take it easy for the rest of today and you should NOT DRIVE or use heavy machinery until tomorrow (because of the sedation medicines used during the test).    FOLLOW UP: Our staff will call the number listed on your records the next business day following your procedure to check on you and address any questions or concerns that you may have regarding the information given to you following your procedure. If we do not reach you, we will leave a message.  However, if you are feeling well and you are not experiencing any problems, there is no need to return our call.  We will assume that you have returned to your regular daily activities without incident.  If any biopsies were taken you will be contacted by phone or by letter within the next 1-3 weeks.  Please call us at [phone] if you have not heard about the biopsies in 3 weeks.   MAY START YOUR XARELTO TOMORROW PER DR. Ardis Hughs  SIGNATURES/CONFIDENTIALITY: You and/or your care partner have signed paperwork which will be entered into your electronic medical record.  These signatures attest to the fact that that the information above  on your After Visit Summary has been reviewed and is understood.  Full responsibility of the confidentiality of this discharge information lies with you and/or your care-partner.  Drs. At [facility] will get copies of these reports.  Further testing might be necessary. Dr. Ardis Hughs will call you with the results.

## 2014-09-03 NOTE — Op Note (Signed)
[place]  [address] [place], [address]   ENDOSCOPY PROCEDURE REPORT  PATIENT: Terry Vargas, Terry Vargas  MR#: [id number] BIRTHDATE: [birth year] , 57  yrs. old GENDER: male ENDOSCOPIST: Milus Banister, MD PROCEDURE DATE:  09/03/2014 PROCEDURE:  EGD w/ biopsy ASA CLASS:     Class III INDICATIONS:  known metastatic colon cancer (originally diagnosed 2012), on Nivolumab treatment through [facility] currently; iron def anemia. MEDICATIONS: Monitored anesthesia care and Propofol 170 mg IV TOPICAL ANESTHETIC: none  DESCRIPTION OF PROCEDURE: After the risks benefits and alternatives of the procedure were thoroughly explained, informed consent was obtained.  The LB [id number] endoscope was introduced through the mouth and advanced to the second portion of the duodenum , Without limitations.  The instrument was slowly withdrawn as the mucosa was fully examined.  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  I could not advance the adult gastroscope through the stricture (lumen estimate 4-58mm).  Biopsies were taken at the site and sent to pathology.  There was mild to moderate non-specific gastritis including pyloric erosions.  The distal stomach was biopsied and sent to pathology.  The examination was otherwise normal.  Retroflexed views revealed no abnormalities. The scope was then withdrawn from the patient and the procedure completed. COMPLICATIONS: There were no immediate complications.  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  I could not advance the adult gastroscope through the stricture (lumen estimate 4-79mm).  Biopsies were taken at the site and sent to pathology.  There was mild to moderate non-specific gastritis including pyloric erosions.  The distal stomach was biopsied and sent to pathology.  The examination was otherwise  normal  RECOMMENDATIONS: Await final pathology.  May need further imaging (UGI, CT).  Will have to coordinate with your [facility] team.   eSigned:  Milus Banister, MD 09/03/2014 1:59 PM    CC: Dr. Clelia Croft [facility] oncology in addition to Dr. Julieanne Manson.

## 2014-09-03 NOTE — Telephone Encounter (Signed)
procedures were faxed to Dr Clelia Croft as requested

## 2014-09-03 NOTE — Op Note (Signed)
[place]  [address]. [place], [address]   COLONOSCOPY PROCEDURE REPORT  PATIENT: Terry Vargas, Terry Vargas  MR#: [id number] BIRTHDATE: [birth year] , 27  yrs. old GENDER: male ENDOSCOPIST: Milus Banister, MD REFERRED ZV:GJFT Edrick Kins, M.D. PROCEDURE DATE:  09/03/2014 PROCEDURE:   Colonoscopy with snare polypectomy and Colonoscopy, diagnostic First Screening Colonoscopy - Avg.  risk and is 50 yrs.  old or older - No.  Prior Negative Screening - Now for repeat screening. N/A  History of Adenoma - Now for follow-up colonoscopy & has been > or = to 3 yrs.  N/A ASA CLASS:   Class III INDICATIONS:T4N2 right colon cancer, resected 2014; since then has proven to have metastasized, on Nivolumab treatment through [facility] with good response, recent found progressive anemia. MEDICATIONS: Monitored anesthesia care and Propofol 230 mg IV  DESCRIPTION OF PROCEDURE:   After the risks benefits and alternatives of the procedure were thoroughly explained, informed consent was obtained.  The digital rectal exam revealed no abnormalities of the rectum.   The LB [id number]  endoscope was introduced through the anus and advanced to the surgical anastomosis. No adverse events experienced.   The quality of the prep was excellent.  The instrument was then slowly withdrawn as the colon was fully examined.   COLON FINDINGS: The right sided ileocolonic anastomosis was normal. One polyp was found, removed and sent to pathology.  This was in the rectum 45mm across, sessile, removed with cold snare.  The examination was otherwise normal.  Retroflexed views revealed no abnormalities. The time to cecum = NA      Withdrawal time = NA        The scope was withdrawn and the procedure completed. COMPLICATIONS: There were no immediate complications.  ENDOSCOPIC IMPRESSION: The right sided ileocolonic anastomosis was normal.  One polyp was found, removed and sent to pathology.  This was in the  rectum 53mm across, sessile, removed with cold snare.  The examination was otherwise normal  RECOMMENDATIONS: Await final pathology  eSigned:  Milus Banister, MD 09/03/2014 1:55 PM   cc: Dr. Clelia Croft [facility] oncology in addition to Dr. Julieanne Manson

## 2014-09-03 NOTE — Progress Notes (Signed)
Pt has left chest wall [facility]

## 2014-09-04 ENCOUNTER — Telehealth: Payer: Self-pay | Admitting: *Deleted

## 2014-09-04 NOTE — Telephone Encounter (Signed)
  Follow up Call-  Call back number 09/03/2014  Post procedure Call Back phone  # [phone]  Permission to leave phone message Yes     Patient questions:  Do you have a fever, pain , or abdominal swelling? No. Pain Score  0 *  Have you tolerated food without any problems? Yes.    Have you been able to return to your normal activities? Yes.    Do you have any questions about your discharge instructions: Diet   No. Medications  No. Follow up visit  No.  Do you have questions or concerns about your Care? No.  Actions: * If pain score is 4 or above: No action needed, pain <4.

## 2014-09-13 ENCOUNTER — Encounter: Payer: Self-pay | Admitting: Physician Assistant

## 2014-09-14 ENCOUNTER — Encounter: Payer: Self-pay | Admitting: Physician Assistant

## 2014-09-14 ENCOUNTER — Telehealth: Payer: Self-pay

## 2014-09-14 NOTE — Telephone Encounter (Signed)
Dr Stann Mainland the PA at [facility] with Dr Reynaldo Minium would like a call back regarding this pt.  Her number is [phone].

## 2014-09-14 NOTE — Telephone Encounter (Signed)
Per Dr Ardis Hughs he spoke with Dr Reynaldo Minium on the phone

## 2014-09-17 NOTE — Telephone Encounter (Signed)
I spoke with Dr. 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  He is planning to discuss with his partners at [facility].  I offered my help, suggested considering holding the onc med and re-evaluating the stricture in 6-8 weeks.  Could also dilate the stricture but I let him know that I prefer dilation of this to be done at [facility] if that is the decision, would be higher than usual risk of perforation and doing at tertiary center probably a good idea.    I discussed with patient as well.

## 2014-09-24 ENCOUNTER — Encounter: Payer: Self-pay | Admitting: Nurse Practitioner

## 2014-09-25 ENCOUNTER — Other Ambulatory Visit: Payer: Self-pay | Admitting: *Deleted

## 2014-09-25 DIAGNOSIS — C189 Malignant neoplasm of colon, unspecified: Secondary | ICD-10-CM

## 2014-09-25 MED ORDER — OPIUM 10 MG/ML (1%) PO TINC: 0.8000 mL | Freq: Four times a day (QID) | ORAL | Status: DC | PRN

## 2014-10-08 ENCOUNTER — Encounter: Payer: Self-pay | Admitting: Gastroenterology

## 2014-10-18 ENCOUNTER — Ambulatory Visit (HOSPITAL_BASED_OUTPATIENT_CLINIC_OR_DEPARTMENT_OTHER): Payer: BLUE CROSS/BLUE SHIELD | Admitting: Oncology

## 2014-10-18 ENCOUNTER — Telehealth: Payer: Self-pay | Admitting: Oncology

## 2014-10-18 VITALS — BP 125/69 | HR 58 | Temp 98.4°F | Resp 20 | Ht 69.0 in | Wt 156.8 lb

## 2014-10-18 DIAGNOSIS — C189 Malignant neoplasm of colon, unspecified: Secondary | ICD-10-CM | POA: Diagnosis not present

## 2014-10-18 DIAGNOSIS — R21 Rash and other nonspecific skin eruption: Secondary | ICD-10-CM

## 2014-10-18 DIAGNOSIS — K521 Toxic gastroenteritis and colitis: Secondary | ICD-10-CM

## 2014-10-18 DIAGNOSIS — C778 Secondary and unspecified malignant neoplasm of lymph nodes of multiple regions: Secondary | ICD-10-CM

## 2014-10-18 MED ORDER — OPIUM 10 MG/ML (1%) PO TINC: 0.8000 mL | Freq: Four times a day (QID) | ORAL | Status: DC | PRN

## 2014-10-18 NOTE — Telephone Encounter (Signed)
Gave and printed appt sched and avs for pt SEpt

## 2014-10-18 NOTE — Progress Notes (Signed)
[facility] OFFICE PROGRESS NOTE   Diagnosis: Colon cancer  INTERVAL HISTORY:   Terry Vargas returns as scheduled. He developed early satiety and underwent an upper endoscopy by Dr. Ardis Hughs on 09/03/2014. A distal duodenal bulb stricture was noted. The stricture was friable and irregular. The gastroscope could not be advanced through the stricture. Biopsies were taken. Mild to moderate gastritis was noted. A colonoscopy the same day revealed a single polyp that was removed. The pathology from the duodenum revealed marketed peptic duodenitis. No malignancy. The distal gastric biopsy revealed chronic gastritis. No H. pylori or malignancy. 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  He develops diarrhea and treatment was placed on hold. He underwent an upper endoscopy at [facility] on 09/20/2014 that confirmed a duodenal stenosis. The stenosis was dilated. He underwent a repeat dilatation at the second portion of the duodenum on 09/28/2014.  He was placed on steroids and antibiotics. The diarrhea is now improved. He is tapering prednisone. He has returned to a normal diet. He developed malaise and weight loss while having severe diarrhea. He is beginning to gain weight. He is working and exercising.  He has a pruritic rash over the arms that he relates to poison ivy.  Objective:  Vital signs in last 24 hours:  Blood pressure 125/69, pulse 58, temperature 98.4 F (36.9 C), temperature source Oral, resp. rate 20, height $RemoveBe'5\' 9"'mRdyayKeI$  (1.753 m), weight 156 lb 12.8 oz (71.124 kg), SpO2 100 %.   Lymphatics: No cervical or supraclavicular nodes Resp: Lungs clear bilaterally Cardio: Regular rate and rhythm GI: No hepatomegaly, nontender, no mass Vascular: Trace edema at the left greater than right lower leg  Skin: Few pustular lesions over the trunk, erythematous slightly raised rash over the forearms   Portacath/PICC-without erythema   Medications: I have reviewed the patient's current  medications.  Assessment/Plan: 1.Stage III (T4, N2) adenocarcinoma of the right colon, status post a right colectomy 11/25/2010. K-ras wild-type.  He began adjuvant FOLFOX chemotherapy 12/31/2010. He completed cycle 12 of FOLFOX on 06/03/2011.  #2-PET scan 12/29/2010 with hypermetabolic retroperitoneal and anterior abdominal adenopathy, postoperative changes of the right colectomy with a possible seroma in the anterior abdomen. Patchy activity was noted in the parotid glands, potentially inflammatory in etiology.  -The hypermetabolic retroperitoneal lymph nodes potentially represented metastatic disease. We presented his case at the GI tumor conference and discussed the case with Dr. Donne Hazel. A decision was made to proceed with "adjuvant" therapy with the plan for a restaging CT at a 3 to four-month interval.  -The restaging CT on 03/23/2011 revealed a decrease in the periaortic lymphadenopathy, no new adenopathy, and no evidence of liver metastases. This suggests the small retroperitoneal lymph nodes may represent metastatic colon cancer versus resolving "inflammatory" nodes.  -Restaging CT 07/13/2011 revealed increased retroperitoneal lymphadenopathy consistent with metastatic disease.  -Restaging PET scan 07/22/2011 confirmed persistent hypermetabolic retroperitoneal lymph nodes. -Restaging CT 11/12/2011 with a slight increase in the size of retroperitoneal/mesenteric lymph nodes, no other evidence of progressive metastatic disease  -Restaging CT 01/27/2012 with a slight increase in the size of a necrotic celiac node, slight decrease in the para-aortic nodes  -Status post palliative radiation to the abdominal/retroperitoneal lymph nodes completed on 04/04/2012  -Restaging CT 05/31/2012 with a slight decrease in the size of abdominal lymph nodes and a stable mass adjacent to the SMA,? New left lung lesion  -Restaging CT 07/29/2012 with new mediastinal/hilar nodes and a new left lingula  nodule. New retrocrural and retroperitoneal nodes.  -  initiation of FOLFIRI/panitumumab on an every 2 week schedule 08/17/2012.  -restaging CT 10/19/2012 after 5 cycles of FOLFIRI/panitumumab with improvement in mediastinal adenopathy, a left lingular nodule, retroperitoneal nodes, and a stable node adjacent to the spleen.  -Cycle 10 of FOLFIRI/panitumumab on 12/21/2012  -Restaging CT 01/02/2013 with stable disease.  -PET scan 01/27/2013 showed progression of metastatic disease with increased number and size of mesenteric, retroperitoneal, mediastinal and bilateral hilar lymph nodes. No extranodal metastatic disease was identified.  -Currently being treated on protocol with Nivolumab at [facility]  -Restaging CTs on 04/19/2013 confirmed a decreased left lung nodule, a decrease peripancreatic mass, and decreased retroperitoneal lymphadenopathy  -Continuation on every 2 week Nivolumab at [facility]  -Restaging CT 08/23/2013 with stable disease.  -Restaging CT 11/15/2013 with stable disease.  -Restaging CT 02/07/2014 stable. -Restaging CT 05/02/2014 stable. 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 A basal cell carcinoma was recently removed from a right finger by Dr. Wilhemina Bonito.  #6-Left leg deep vein thrombosis confirmed on a Doppler ultrasound 01/28/2011. Now on xarelto.  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 He has persistent mild numbness in the fingertips and toes. The numbness does not interfere with activity.  #8-Mild neutropenia on 03/11/2011, he received Neulasta following cycle #6 of FOLFOX. He also received Neulasta support following cycle 8, cycle 10 and cycle #12.  #9-history of Thrombocytopenia secondary  to chemotherapy.  #10-Low back pain-most likely related to the necrotic celiac node. He completed palliative radiation on 04/04/2012. The pain resolved. He recently presented with progressive back pain likely secondary to progression of retrocrural/retroperitoneal adenopathy. The low back pain has resolved.  #11-Fever/rash on 07/29/2012-? contrast reaction.  #12-Clinical and CT evidence of a fractured Port-A-Cath 07/29/2012. The Port-A-Cath was removed on 08/04/2012. There was a linear laceration in the line. New Port-A-Cath placed on 08/15/2012.  #13-History of skin rash secondary to panitumumab.  #14-Abdominal pain beginning approximately 1 week after treatment occurring with cycles 7 and 8 FOLFIRI/Panitumumab. Status post celiac plexus block 01/12/2013 with no improvement in pain. He is status post a celiac block in interventional radiology on 02/14/2013. The pain has resolved. He is no longer taking pain medication.  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 He is recovering from an episode of Nivolumab induced enteritis. He will continue the prednisone taper as directed by Dr. Reynaldo Minium. 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  He continues close clinical follow-up with Dr. Reynaldo Minium. He will return for an office visit here in 3 months.  Betsy Coder, MD  10/18/2014  3:10 PM

## 2014-10-25 ENCOUNTER — Encounter: Payer: Self-pay | Admitting: Gastroenterology

## 2014-10-30 IMAGING — CT CT ABD-PELV W/ CM
2 of 5 series · 16 of 46 positions shown, 18 images · IV contrast (OMNIPAQUE)
Comparison: CT 07/29/2012, PET CT 07/22/2011

CT CHEST

CLINICAL DATA: Restaging metastatic colorectal carcinoma.  Prior
chemo radiation therapy.  Ongoing chemotherapy.

CT CHEST, ABDOMEN AND PELVIS WITH CONTRAST
TECHNIQUE: Multidetector CT imaging of the chest, abdomen and
pelvis was performed following the standard protocol during bolus
administration of intravenous contrast.
Contrast: 100mL OMNIPAQUE IOHEXOL 300 MG/ML  SOLN

[Series 2: cap with st · axial · 0.77mm/px · z∈[+662,+1237]mm · 13 of 131 slices shown, 15 images]
[im 8/131  soft-tissue]
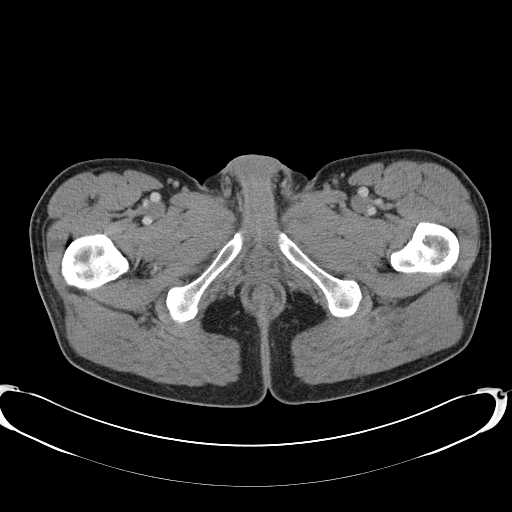
[im 8/131  bone]
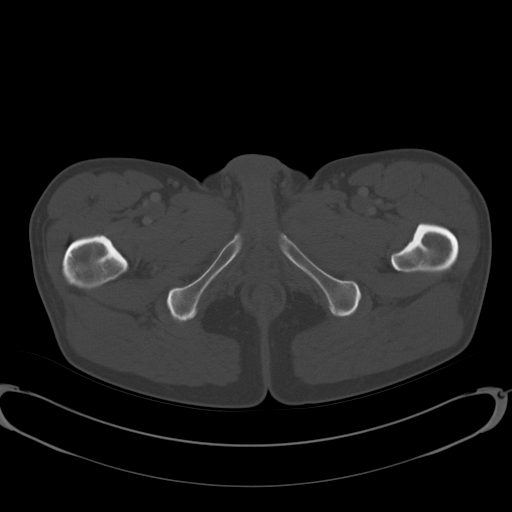
[im 15/131  soft-tissue]
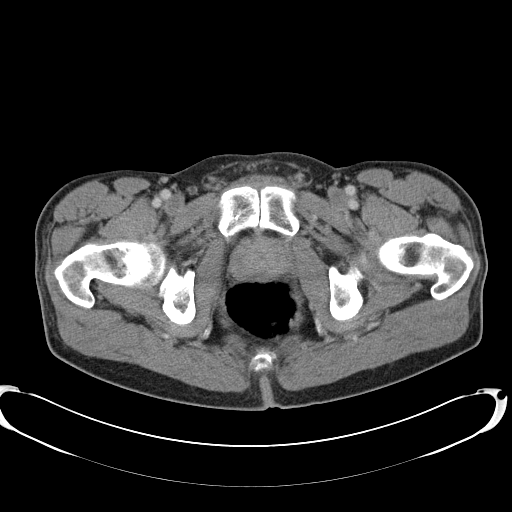
[im 29/131  soft-tissue]
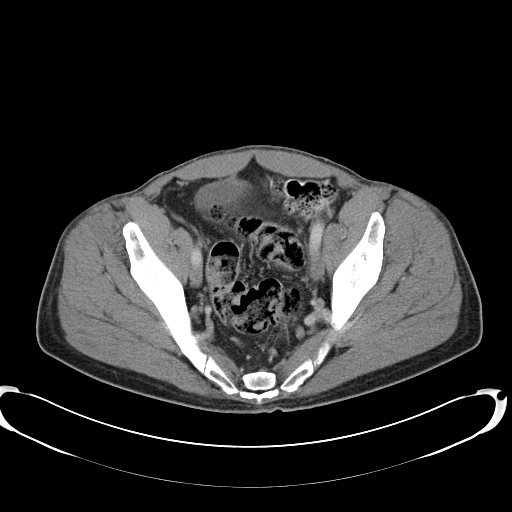
[im 37/131  soft-tissue]
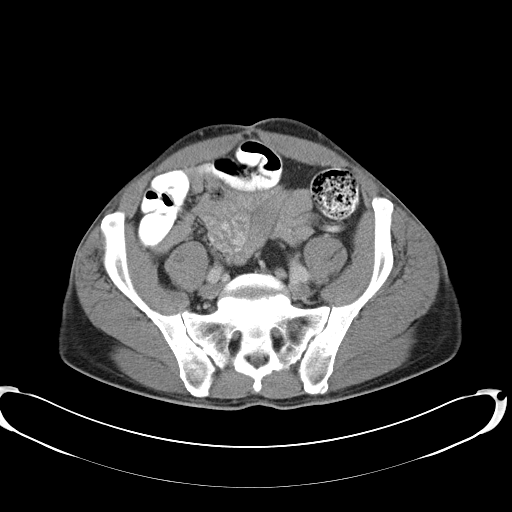
[im 44/131  soft-tissue]
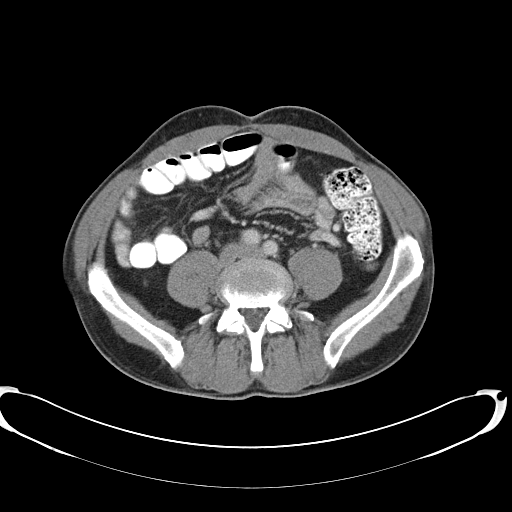
[im 58/131  soft-tissue]
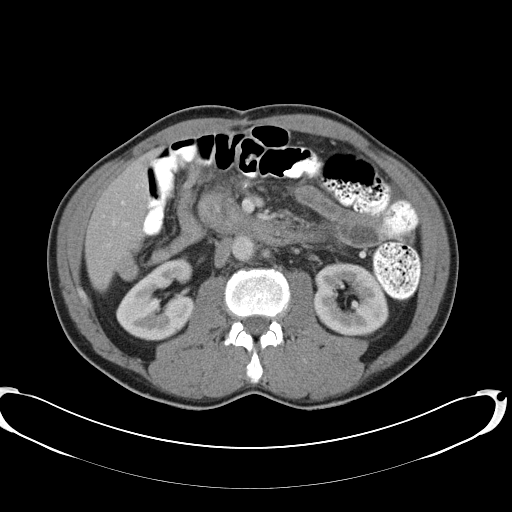
[im 66/131  soft-tissue]
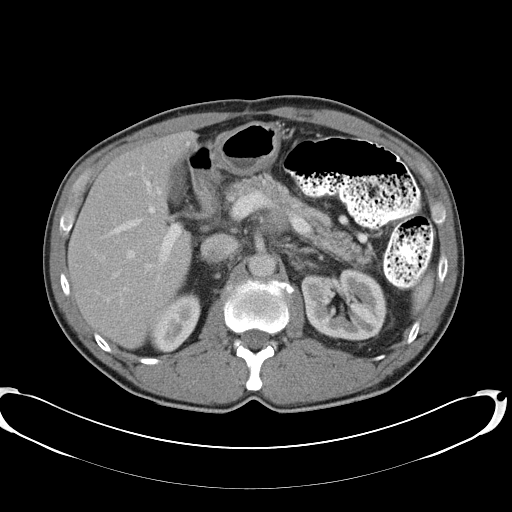
[im 73/131  soft-tissue]
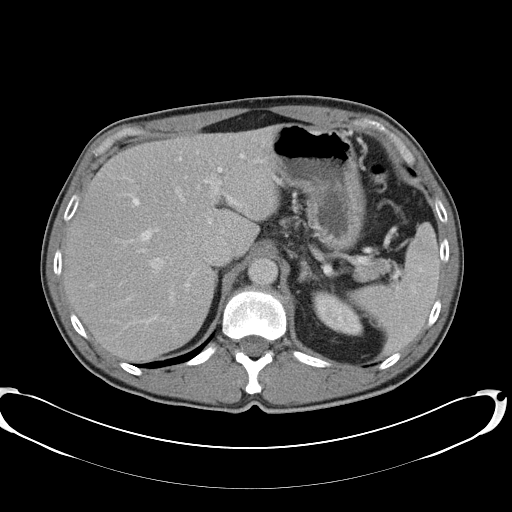
[im 87/131  soft-tissue]
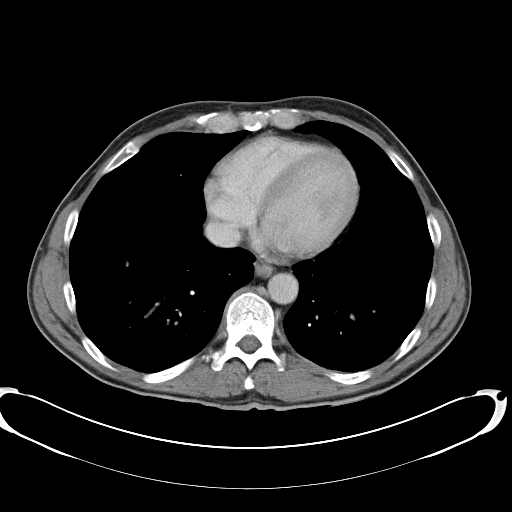
[im 87/131  bone]
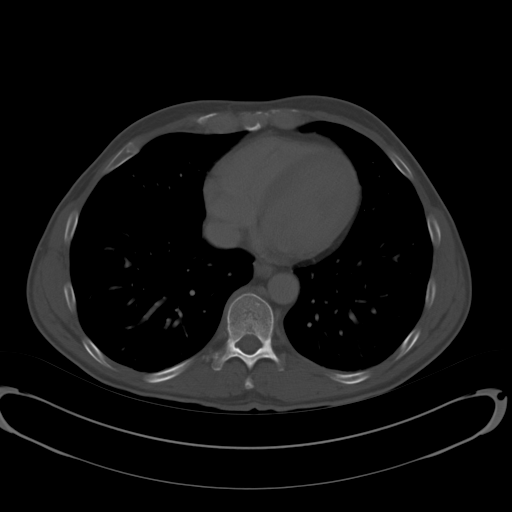
[im 94/131  soft-tissue]
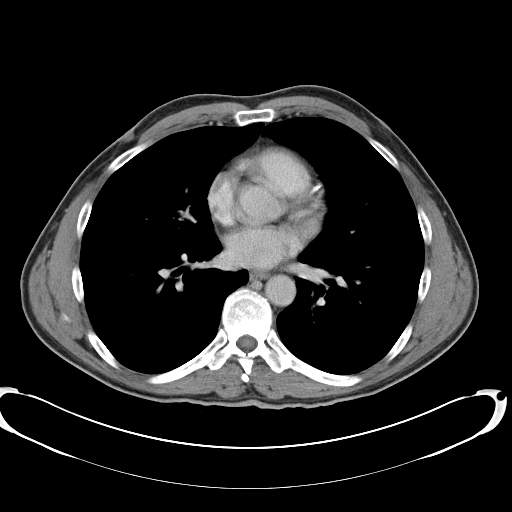
[im 102/131  soft-tissue]
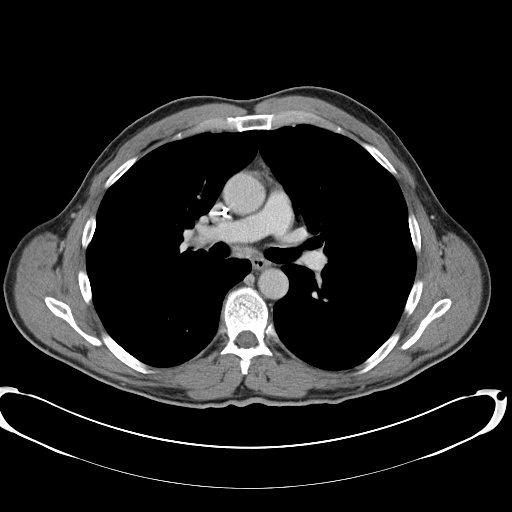
[im 116/131  soft-tissue]
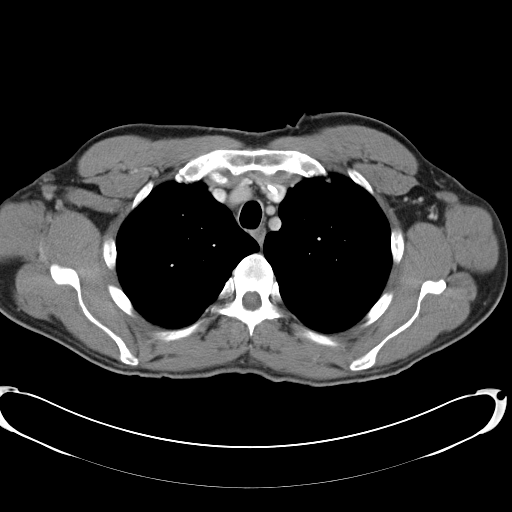
[im 123/131  soft-tissue]
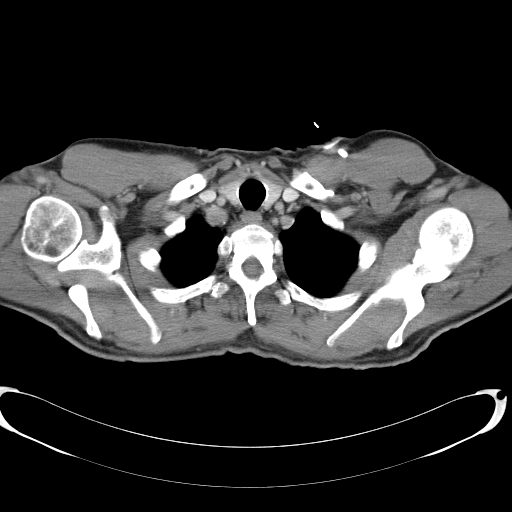

[Series 602: <mpr thick range> · coronal · 1.28mm/px · 3 of 81 slices shown]
[im 27/81  soft-tissue]
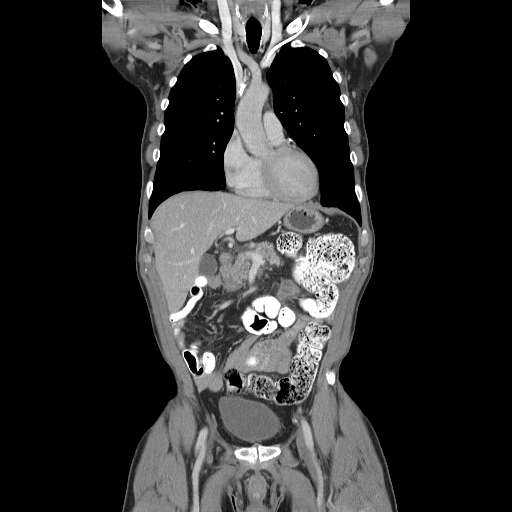
[im 36/81  soft-tissue]
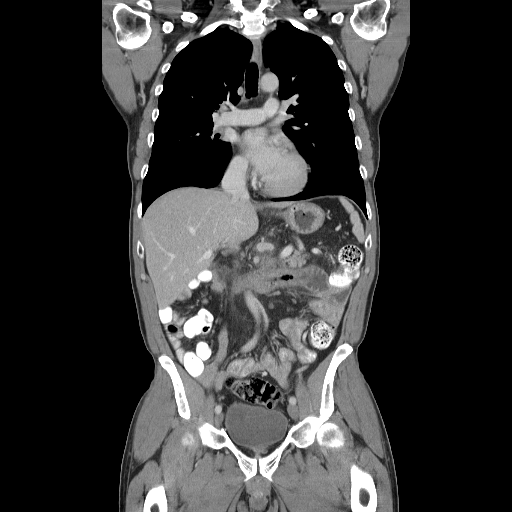
[im 45/81  soft-tissue]
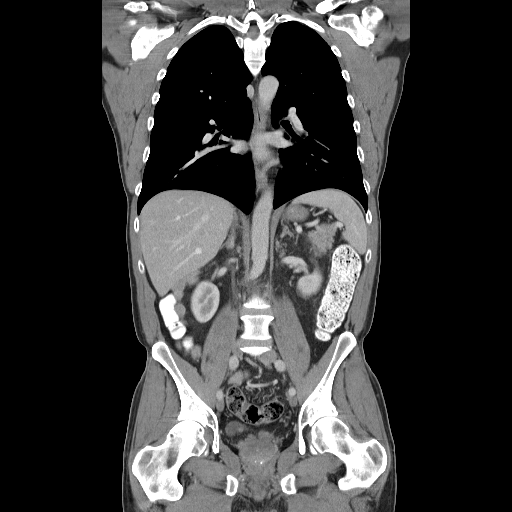

[16 of 46 positions shown; findings below may reference images not displayed]

FINDINGS: There is a port in the left anterior chest wall.  No
axillary or supraclavicular lymphadenopathy.  Subcarinal lymph node
measuring 8 mm is decreased from 12 mm on prior.  Left hilar lymph
node measures 5 mm decreased from 11 mm on prior.  No new
mediastinal adenopathy.

Review of the lung parenchyma demonstrates a 6-mm lingular nodule
(image 40) which compares to 9 mm on prior.
IMPRESSION: 1.  Decrease in mediastinal lymphadenopathy.
2.  Decrease in size of lingular pulmonary nodule.

CT ABDOMEN AND PELVIS
FINDINGS: No focal hepatic lesion.  The gallbladder, pancreas,
spleen, adrenal glands, kidneys are normal.

Stomach is normal.  There is continued mild inflammation along the
third portion of duodenum.  There is an enteric colonic anastomosis
at the level transverse colon.  The left colon appear normal and
stool filled.

Abdominal aorta is normal caliber.  There is enlarged
retroperitoneal lymph node adjacent to the splenic vein measuring
24 mm compared to 24 mm on prior.  Small retrocrural lymph node
measuring 7 mm compares to 11 mm on prior.  Distal left periaortic
lymph node measures 9 mm compared to 13 mm on prior.

In the pelvis, prostate gland bladder normal.  No pelvic
lymphadenopathy.

Review of  bone windows demonstrates no aggressive osseous lesions.
IMPRESSION: 1..  Mild decrease in size of retroperitoneal and retrocrural lymph
nodes.
2.  The dominant lymph node adjacent to the splenic vein / celiac
trunk is not changed.
3.  No evidence of disease progression.

4. .  Mild inflammation along the third portion the duodenum is
similar prior.

## 2014-11-23 ENCOUNTER — Encounter: Payer: Self-pay | Admitting: Nurse Practitioner

## 2014-11-23 ENCOUNTER — Other Ambulatory Visit: Payer: Self-pay | Admitting: *Deleted

## 2014-11-23 DIAGNOSIS — C189 Malignant neoplasm of colon, unspecified: Secondary | ICD-10-CM

## 2014-11-23 MED ORDER — OPIUM 10 MG/ML (1%) PO TINC: 0.8000 mL | Freq: Four times a day (QID) | ORAL | Status: DC | PRN

## 2015-01-17 ENCOUNTER — Ambulatory Visit (HOSPITAL_BASED_OUTPATIENT_CLINIC_OR_DEPARTMENT_OTHER): Payer: BLUE CROSS/BLUE SHIELD | Admitting: Nurse Practitioner

## 2015-01-17 ENCOUNTER — Telehealth: Payer: Self-pay | Admitting: Oncology

## 2015-01-17 VITALS — BP 135/77 | HR 58 | Temp 98.5°F | Resp 18 | Ht 69.0 in | Wt 166.1 lb

## 2015-01-17 DIAGNOSIS — C189 Malignant neoplasm of colon, unspecified: Secondary | ICD-10-CM | POA: Diagnosis not present

## 2015-01-17 DIAGNOSIS — C778 Secondary and unspecified malignant neoplasm of lymph nodes of multiple regions: Secondary | ICD-10-CM

## 2015-01-17 DIAGNOSIS — R197 Diarrhea, unspecified: Secondary | ICD-10-CM

## 2015-01-17 DIAGNOSIS — D509 Iron deficiency anemia, unspecified: Secondary | ICD-10-CM

## 2015-01-17 MED ORDER — OPIUM 10 MG/ML (1%) PO TINC: 0.8000 mL | Freq: Four times a day (QID) | ORAL | Status: DC | PRN

## 2015-01-17 NOTE — Telephone Encounter (Signed)
Gave and printed appts ched and avs for pt for DEC  °

## 2015-01-17 NOTE — Progress Notes (Addendum)
[place] OFFICE PROGRESS NOTE   Diagnosis:  Colon cancer  INTERVAL HISTORY:   Terry Vargas returns as scheduled. Nivolumab was reinitiated on study on 11/14/2014. CT scans 01/09/2015 were stable. Treatment was placed on hold 01/09/2015 due to a contact dermatitis. The skin rash is better. He is applying a sterile cream periodically. Diarrhea is better. He denies nausea/vomiting. He has a good appetite. He is gaining weight. He denies pain. He has minimal numbness in the fingertips. He has stable numbness in the feet.  Objective:  Vital signs in last 24 hours:  Blood pressure 135/77, pulse 58, temperature 98.5 F (36.9 C), temperature source Oral, resp. rate 18, height $RemoveBe'5\' 9"'gVliCrdlG$  (1.753 m), weight 166 lb 1.6 oz (75.342 kg), SpO2 100 %.    HEENT: No thrush or ulcers. Resp: Lungs clear bilaterally. Cardio: Regular rate and rhythm. GI: Abdomen is soft and nontender. No hepatomegaly. No mass. Vascular: Trace lower leg edema bilaterally. Port-A-Cath without erythema.  Lab Results:  Lab Results  Component Value Date   WBC 5.2 03/06/2013   HGB 10.1* 03/06/2013   HCT 30.4* 03/06/2013   MCV 87.6 03/06/2013   PLT 191 03/06/2013   NEUTROABS 4.2 03/06/2013    Imaging:  No results found.  Medications: I have reviewed the patient's current medications.  Assessment/Plan: 1.Stage III (T4, N2) adenocarcinoma of the right colon, status post a right colectomy 11/25/2010. K-ras wild-type.  He began adjuvant FOLFOX chemotherapy 12/31/2010. He completed cycle 12 of FOLFOX on 06/03/2011.  #2-PET scan 12/29/2010 with hypermetabolic retroperitoneal and anterior abdominal adenopathy, postoperative changes of the right colectomy with a possible seroma in the anterior abdomen. Patchy activity was noted in the parotid glands, potentially inflammatory in etiology.  -The hypermetabolic retroperitoneal lymph nodes potentially represented metastatic disease. We presented his case at the  GI tumor conference and discussed the case with Dr. Donne Hazel. A decision was made to proceed with "adjuvant" therapy with the plan for a restaging CT at a 3 to four-month interval.  -The restaging CT on 03/23/2011 revealed a decrease in the periaortic lymphadenopathy, no new adenopathy, and no evidence of liver metastases. This suggests the small retroperitoneal lymph nodes may represent metastatic colon cancer versus resolving "inflammatory" nodes.  -Restaging CT 07/13/2011 revealed increased retroperitoneal lymphadenopathy consistent with metastatic disease.  -Restaging PET scan 07/22/2011 confirmed persistent hypermetabolic retroperitoneal lymph nodes. -Restaging CT 11/12/2011 with a slight increase in the size of retroperitoneal/mesenteric lymph nodes, no other evidence of progressive metastatic disease  -Restaging CT 01/27/2012 with a slight increase in the size of a necrotic celiac node, slight decrease in the para-aortic nodes  -Status post palliative radiation to the abdominal/retroperitoneal lymph nodes completed on 04/04/2012  -Restaging CT 05/31/2012 with a slight decrease in the size of abdominal lymph nodes and a stable mass adjacent to the SMA,? New left lung lesion  -Restaging CT 07/29/2012 with new mediastinal/hilar nodes and a new left lingula nodule. New retrocrural and retroperitoneal nodes.  -initiation of FOLFIRI/panitumumab on an every 2 week schedule 08/17/2012.  -restaging CT 10/19/2012 after 5 cycles of FOLFIRI/panitumumab with improvement in mediastinal adenopathy, a left lingular nodule, retroperitoneal nodes, and a stable node adjacent to the spleen.  -Cycle 10 of FOLFIRI/panitumumab on 12/21/2012  -Restaging CT 01/02/2013 with stable disease.  -PET scan 01/27/2013 showed progression of metastatic disease with increased number and size of mesenteric, retroperitoneal, mediastinal and bilateral hilar lymph nodes. No extranodal metastatic disease was identified.   -Currently being treated on protocol with Nivolumab at [facility]  -  Restaging CTs on 04/19/2013 confirmed a decreased left lung nodule, a decrease peripancreatic mass, and decreased retroperitoneal lymphadenopathy  -Continuation on every 2 week Nivolumab at [facility]  -Restaging CT 08/23/2013 with stable disease.  -Restaging CT 11/15/2013 with stable disease.  -Restaging CT 02/07/2014 stable. -Restaging CT 05/02/2014 stable. -Restaging CT 10/17/2014-stable disease -Restaging CT 01/09/2015 stable. 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 A basal cell carcinoma was recently removed from a right finger by Dr. Wilhemina Bonito.  #6-Left leg deep vein thrombosis confirmed on a Doppler ultrasound 01/28/2011. Now on xarelto.  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 He has persistent mild numbness in the fingertips and toes. The numbness does not interfere with activity.  #8-Mild neutropenia on 03/11/2011, he received Neulasta following cycle #6 of FOLFOX. He also received Neulasta support following cycle 8, cycle 10 and cycle #12.  #9-history of Thrombocytopenia secondary to chemotherapy.  #10-Low back pain-most likely related to the necrotic celiac node. He completed palliative radiation on 04/04/2012. The pain resolved. He recently presented with progressive back pain likely secondary to progression of retrocrural/retroperitoneal adenopathy. The low back pain has resolved.  #11-Fever/rash on 07/29/2012-? contrast reaction.  #12-Clinical and CT evidence of a fractured Port-A-Cath 07/29/2012. The Port-A-Cath was removed on 08/04/2012. There was a linear laceration in the line. New Port-A-Cath placed on 08/15/2012.  #13-History of skin rash secondary to panitumumab.   #14-Abdominal pain beginning approximately 1 week after treatment occurring with cycles 7 and 8 FOLFIRI/Panitumumab. Status post celiac plexus block 01/12/2013 with no improvement in pain. He is status post a celiac block in interventional radiology on 02/14/2013. The pain has resolved. He is no longer taking pain medication.  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 Kahrs remains in clinical remission from colon cancer. He continues on study with nivolumab at [facility]. Treatment is currently on hold due to a contact dermatitis. He has a follow-up appointment at [facility] next week. We scheduled a return visit here in 3 months. He will contact the office in the interim with any problems.  Patient seen with Dr. Benay Spice.    Terry Vargas, Terry Vargas ANP/GNP-BC   01/17/2015  4:19 PM  This was a shared visit with Terry Vargas. He continues treatment on protocol at [facility]. We will be glad to administer Nivolumab here when he comes off study.  Terry Vargas, M.D.

## 2015-03-20 ENCOUNTER — Encounter: Payer: Self-pay | Admitting: Nurse Practitioner

## 2015-03-20 ENCOUNTER — Other Ambulatory Visit: Payer: Self-pay | Admitting: *Deleted

## 2015-03-20 DIAGNOSIS — C189 Malignant neoplasm of colon, unspecified: Secondary | ICD-10-CM

## 2015-03-20 MED ORDER — OPIUM 10 MG/ML (1%) PO TINC: 0.8000 mL | Freq: Four times a day (QID) | ORAL | Status: DC | PRN

## 2015-04-18 ENCOUNTER — Ambulatory Visit (HOSPITAL_BASED_OUTPATIENT_CLINIC_OR_DEPARTMENT_OTHER): Payer: BLUE CROSS/BLUE SHIELD | Admitting: Oncology

## 2015-04-18 ENCOUNTER — Telehealth: Payer: Self-pay | Admitting: Oncology

## 2015-04-18 ENCOUNTER — Other Ambulatory Visit: Payer: Self-pay | Admitting: *Deleted

## 2015-04-18 VITALS — BP 137/67 | HR 57 | Temp 98.6°F | Resp 18 | Ht 69.0 in | Wt 166.0 lb

## 2015-04-18 DIAGNOSIS — C189 Malignant neoplasm of colon, unspecified: Secondary | ICD-10-CM

## 2015-04-18 DIAGNOSIS — D509 Iron deficiency anemia, unspecified: Secondary | ICD-10-CM | POA: Diagnosis not present

## 2015-04-18 DIAGNOSIS — Z1509 Genetic susceptibility to other malignant neoplasm: Secondary | ICD-10-CM

## 2015-04-18 MED ORDER — OPIUM 10 MG/ML (1%) PO TINC: 0.8000 mL | Freq: Four times a day (QID) | ORAL | Status: DC | PRN

## 2015-04-18 NOTE — Telephone Encounter (Signed)
per pf to sch pt appt-gave pt copy of avs °

## 2015-04-18 NOTE — Progress Notes (Signed)
[place] OFFICE PROGRESS NOTE   Diagnosis: Colon cancer  INTERVAL HISTORY:   Terry Vargas returns as scheduled. He continues every 2 week Nivolumab. A few weeks ago he had an acute episode of nausea/vomiting and diarrhea that resolved spontaneously. He does not have diarrhea in general. He continues Lomotil, Imodium, and tincture of opium. Minimal skin rash. He feels like the legs are "heavy "when he is running. He exercises 5-6 days per week. No pain. He reports malaise.  Objective:  Vital signs in last 24 hours:  Blood pressure 137/67, pulse 57, temperature 98.6 F (37 C), temperature source Oral, resp. rate 18, height $RemoveBe'5\' 9"'ZenMtUelS$  (1.753 m), weight 166 lb (75.297 kg), SpO2 98 %.    HEENT: No thrush or ulcers Resp: Coarse and inspiratory rales at the right base, no respiratory distress Cardio: Regular rate and rhythm GI: No hepatomegaly, no mass, nontender Vascular: Trace edema at the left greater than right lower leg  Skin: Mild acne type rash over the trunk   Portacath/PICC-without erythema   Medications: I have reviewed the patient's current medications.  Assessment/Plan: 1.Stage III (T4, N2) adenocarcinoma of the right colon, status post a right colectomy 11/25/2010. K-ras wild-type.  He began adjuvant FOLFOX chemotherapy 12/31/2010. He completed cycle 12 of FOLFOX on 06/03/2011.  #2-PET scan 12/29/2010 with hypermetabolic retroperitoneal and anterior abdominal adenopathy, postoperative changes of the right colectomy with a possible seroma in the anterior abdomen. Patchy activity was noted in the parotid glands, potentially inflammatory in etiology.  -The hypermetabolic retroperitoneal lymph nodes potentially represented metastatic disease. We presented his case at the GI tumor conference and discussed the case with Dr. Donne Hazel. A decision was made to proceed with "adjuvant" therapy with the plan for a restaging CT at a 3 to four-month interval.  -The  restaging CT on 03/23/2011 revealed a decrease in the periaortic lymphadenopathy, no new adenopathy, and no evidence of liver metastases. This suggests the small retroperitoneal lymph nodes may represent metastatic colon cancer versus resolving "inflammatory" nodes.  -Restaging CT 07/13/2011 revealed increased retroperitoneal lymphadenopathy consistent with metastatic disease.  -Restaging PET scan 07/22/2011 confirmed persistent hypermetabolic retroperitoneal lymph nodes. -Restaging CT 11/12/2011 with a slight increase in the size of retroperitoneal/mesenteric lymph nodes, no other evidence of progressive metastatic disease  -Restaging CT 01/27/2012 with a slight increase in the size of a necrotic celiac node, slight decrease in the para-aortic nodes  -Status post palliative radiation to the abdominal/retroperitoneal lymph nodes completed on 04/04/2012  -Restaging CT 05/31/2012 with a slight decrease in the size of abdominal lymph nodes and a stable mass adjacent to the SMA,? New left lung lesion  -Restaging CT 07/29/2012 with new mediastinal/hilar nodes and a new left lingula nodule. New retrocrural and retroperitoneal nodes.  -initiation of FOLFIRI/panitumumab on an every 2 week schedule 08/17/2012.  -restaging CT 10/19/2012 after 5 cycles of FOLFIRI/panitumumab with improvement in mediastinal adenopathy, a left lingular nodule, retroperitoneal nodes, and a stable node adjacent to the spleen.  -Cycle 10 of FOLFIRI/panitumumab on 12/21/2012  -Restaging CT 01/02/2013 with stable disease.  -PET scan 01/27/2013 showed progression of metastatic disease with increased number and size of mesenteric, retroperitoneal, mediastinal and bilateral hilar lymph nodes. No extranodal metastatic disease was identified.  -Currently being treated on protocol with Nivolumab at [facility]  -Restaging CTs on 04/19/2013 confirmed a decreased left lung nodule, a decrease peripancreatic mass, and decreased  retroperitoneal lymphadenopathy  -Continuation on every 2 week Nivolumab at [facility]  -Restaging CT 08/23/2013 with stable disease.  -  Restaging CT 11/15/2013 with stable disease.  -Restaging CT 02/07/2014 stable. -Restaging CT 05/02/2014 stable. -Restaging CT 10/17/2014-stable disease -Restaging CT 01/09/2015 stable. 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 A basal cell carcinoma was recently removed from a right finger by Dr. Wilhemina Bonito.  #6-Left leg deep vein thrombosis confirmed on a Doppler ultrasound 01/28/2011. Now on xarelto.  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 He has persistent mild numbness in the fingertips and toes. The numbness does not interfere with activity.  #8-Mild neutropenia on 03/11/2011, he received Neulasta following cycle #6 of FOLFOX. He also received Neulasta support following cycle 8, cycle 10 and cycle #12.  #9-history of Thrombocytopenia secondary to chemotherapy.  #10-Low back pain-most likely related to the necrotic celiac node. He completed palliative radiation on 04/04/2012. The pain resolved. He recently presented with progressive back pain likely secondary to progression of retrocrural/retroperitoneal adenopathy. The low back pain has resolved.  #11-Fever/rash on 07/29/2012-? contrast reaction.  #12-Clinical and CT evidence of a fractured Port-A-Cath 07/29/2012. The Port-A-Cath was removed on 08/04/2012. There was a linear laceration in the line. New Port-A-Cath placed on 08/15/2012.  #13-History of skin rash secondary to panitumumab.  #14-Abdominal pain beginning approximately 1  week after treatment occurring with cycles 7 and 8 FOLFIRI/Panitumumab. Status post celiac plexus block 01/12/2013 with no improvement in pain. He is status post a celiac block in interventional radiology on 02/14/2013. The pain has resolved. He is no longer taking pain medication.  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 He will continue Nivolumab every 2 weeks. The mild leg edema may be related to amlodipine or prednisone.  Terry Vargas is followed closely in the oncology clinic at [facility]. He will return for an office visit here in 4 months. He will seek medical attention for respiratory symptoms.  Betsy Coder, MD  04/18/2015  3:54 PM

## 2015-08-11 ENCOUNTER — Encounter: Payer: Self-pay | Admitting: Oncology

## 2015-08-14 ENCOUNTER — Telehealth: Payer: Self-pay | Admitting: Oncology

## 2015-08-14 ENCOUNTER — Other Ambulatory Visit: Payer: Self-pay | Admitting: *Deleted

## 2015-08-14 NOTE — Telephone Encounter (Signed)
cld pt and left message of r/s time & date of appt for 4/25@3 :30

## 2015-08-15 ENCOUNTER — Encounter: Payer: Self-pay | Admitting: Oncology

## 2015-08-22 ENCOUNTER — Ambulatory Visit: Payer: BLUE CROSS/BLUE SHIELD | Admitting: Oncology

## 2015-09-10 ENCOUNTER — Telehealth: Payer: Self-pay | Admitting: Oncology

## 2015-09-10 ENCOUNTER — Ambulatory Visit (HOSPITAL_BASED_OUTPATIENT_CLINIC_OR_DEPARTMENT_OTHER): Payer: BLUE CROSS/BLUE SHIELD | Admitting: Oncology

## 2015-09-10 VITALS — BP 115/62 | HR 56 | Temp 98.1°F | Resp 17 | Ht 69.0 in | Wt 153.6 lb

## 2015-09-10 DIAGNOSIS — D509 Iron deficiency anemia, unspecified: Secondary | ICD-10-CM

## 2015-09-10 DIAGNOSIS — C189 Malignant neoplasm of colon, unspecified: Secondary | ICD-10-CM | POA: Diagnosis not present

## 2015-09-10 DIAGNOSIS — Z1509 Genetic susceptibility to other malignant neoplasm: Secondary | ICD-10-CM

## 2015-09-10 NOTE — Telephone Encounter (Signed)
Gave and printed appt sched and avs for June °

## 2015-09-10 NOTE — Progress Notes (Signed)
[place] OFFICE PROGRESS NOTE   Diagnosis: Colon cancer  INTERVAL HISTORY:   Terry Vargas returns as scheduled. He was last treated with Nivolumab on 04/17/2015. He developed a duodenal ulcer/stricture and underwent a palliative gastrojejunostomy 08/19/2015. He reports diarrhea following the gastrojejunostomy. He is now taking Imodium, Lomotil, and tincture of opium on a scheduled basis. Restaging CTs at [facility] on 09/05/2015 revealed no evidence of progressive disease. Inflammatory changes were noted in the lungs. He walks 4-5 miles per day. He is eating a regular diet. 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 A stool sample for the C. difficile toxin was negative on 09/05/2015.  Objective:  Vital signs in last 24 hours:  Blood pressure 115/62, pulse 56, temperature 98.1 F (36.7 C), temperature source Oral, resp. rate 17, height 5' 9" (1.753 m), weight 153 lb 9.6 oz (69.673 kg), SpO2 98 %.   Resp: Lungs clear bilaterally Cardio: Regular rate and rhythm GI: No hepatomegaly, nontender, no mass Vascular: Trace edema at the left greater than right lower leg  Skin: Mild pustular rash over the trunk   Portacath/PICC-without erythema  Medications: I have reviewed the patient's current medications.  Assessment/Plan: 1. Stage III (T4, N2) adenocarcinoma of the right colon, status post a right colectomy 11/25/2010. K-ras wild-type.  He began adjuvant FOLFOX chemotherapy 12/31/2010. He completed cycle 12 of FOLFOX on 06/03/2011.  #2-PET scan 12/29/2010 with hypermetabolic retroperitoneal and anterior abdominal adenopathy, postoperative changes of the right colectomy with a possible seroma in the anterior abdomen. Patchy activity was noted in the parotid glands, potentially inflammatory in etiology.  -The hypermetabolic retroperitoneal lymph nodes potentially represented metastatic disease. We presented his case at the GI tumor conference and  discussed the case with Dr. Donne Hazel. A decision was made to proceed with "adjuvant" therapy with the plan for a restaging CT at a 3 to four-month interval.  -The restaging CT on 03/23/2011 revealed a decrease in the periaortic lymphadenopathy, no new adenopathy, and no evidence of liver metastases. This suggests the small retroperitoneal lymph nodes may represent metastatic colon cancer versus resolving "inflammatory" nodes.  -Restaging CT 07/13/2011 revealed increased retroperitoneal lymphadenopathy consistent with metastatic disease.  -Restaging PET scan 07/22/2011 confirmed persistent hypermetabolic retroperitoneal lymph nodes. -Restaging CT 11/12/2011 with a slight increase in the size of retroperitoneal/mesenteric lymph nodes, no other evidence of progressive metastatic disease  -Restaging CT 01/27/2012 with a slight increase in the size of a necrotic celiac node, slight decrease in the para-aortic nodes  -Status post palliative radiation to the abdominal/retroperitoneal lymph nodes completed on 04/04/2012  -Restaging CT 05/31/2012 with a slight decrease in the size of abdominal lymph nodes and a stable mass adjacent to the SMA,? New left lung lesion  -Restaging CT 07/29/2012 with new mediastinal/hilar nodes and a new left lingula nodule. New retrocrural and retroperitoneal nodes.  -initiation of FOLFIRI/panitumumab on an every 2 week schedule 08/17/2012.  -restaging CT 10/19/2012 after 5 cycles of FOLFIRI/panitumumab with improvement in mediastinal adenopathy, a left lingular nodule, retroperitoneal nodes, and a stable node adjacent to the spleen.  -Cycle 10 of FOLFIRI/panitumumab on 12/21/2012  -Restaging CT 01/02/2013 with stable disease.  -PET scan 01/27/2013 showed progression of metastatic disease with increased number and size of mesenteric, retroperitoneal, mediastinal and bilateral hilar lymph nodes. No extranodal metastatic disease was identified.  - treated on protocol  with Nivolumab at [place] CTs on 04/19/2013 confirmed a decreased left lung nodule, a decrease peripancreatic mass, and decreased retroperitoneal  lymphadenopathy  -Continuation on every 2 week Nivolumab at [facility]  -Restaging CT 08/23/2013 with stable disease.  -Restaging CT 11/15/2013 with stable disease.  -Restaging CT 02/07/2014 stable. -Restaging CT 05/02/2014 stable. -Restaging CT 10/17/2014-stable disease -Restaging CT 01/09/2015 stable. 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 A basal cell carcinoma was recently removed from a right finger by Dr. Wilhemina Bonito.  #6-Left leg deep vein thrombosis confirmed on a Doppler ultrasound 01/28/2011. Now on xarelto.  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 He has persistent mild numbness in the fingertips and toes. The numbness does not interfere with activity.  #8-Mild neutropenia on 03/11/2011, he received Neulasta following cycle #6 of FOLFOX. He also received Neulasta support following cycle 8, cycle 10 and cycle #12.  #9-history of Thrombocytopenia secondary to chemotherapy.  #10-Low back pain-most likely related to the necrotic celiac node. He completed palliative radiation on 04/04/2012. The pain resolved. He presented with progressive back pain likely secondary to progression of retrocrural/retroperitoneal adenopathy. The low back pain has resolved.  #11-Fever/rash on 07/29/2012-? contrast reaction.   #12-Clinical and CT evidence of a fractured Port-A-Cath 07/29/2012. The Port-A-Cath was removed on 08/04/2012. There was a linear laceration in the line. New Port-A-Cath placed on 08/15/2012.  #13-History of skin rash secondary to panitumumab.  #14-Abdominal pain beginning approximately 1 week after treatment occurring with cycles 7 and 8 FOLFIRI/Panitumumab. Status post celiac plexus block 01/12/2013 with no improvement in pain. He is status post a celiac block in interventional radiology on 02/14/2013. The pain has resolved. He is no longer taking pain medication.  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 He remains off of specific therapy for colon cancer. He will return for an office visit and Port-A-Cath flush in 6 weeks.  He will be scheduled for restaging CTs at [facility] in July. We can consider repeat treatment with Nivolumab, other immunotherapy, or standard systemic therapy if he has disease progression.   Betsy Coder, MD  09/10/2015  4:27 PM

## 2015-10-17 ENCOUNTER — Telehealth: Payer: Self-pay | Admitting: Oncology

## 2015-10-17 ENCOUNTER — Ambulatory Visit (HOSPITAL_BASED_OUTPATIENT_CLINIC_OR_DEPARTMENT_OTHER): Payer: BLUE CROSS/BLUE SHIELD

## 2015-10-17 ENCOUNTER — Ambulatory Visit (HOSPITAL_BASED_OUTPATIENT_CLINIC_OR_DEPARTMENT_OTHER): Payer: BLUE CROSS/BLUE SHIELD | Admitting: Nurse Practitioner

## 2015-10-17 VITALS — BP 116/68 | HR 58 | Temp 98.0°F | Resp 18 | Ht 69.0 in | Wt 156.0 lb

## 2015-10-17 DIAGNOSIS — D509 Iron deficiency anemia, unspecified: Secondary | ICD-10-CM

## 2015-10-17 DIAGNOSIS — C189 Malignant neoplasm of colon, unspecified: Secondary | ICD-10-CM | POA: Diagnosis not present

## 2015-10-17 DIAGNOSIS — R197 Diarrhea, unspecified: Secondary | ICD-10-CM

## 2015-10-17 DIAGNOSIS — Z1509 Genetic susceptibility to other malignant neoplasm: Secondary | ICD-10-CM

## 2015-10-17 MED ORDER — SODIUM CHLORIDE 0.9% FLUSH
10.0000 mL | INTRAVENOUS | Status: DC | PRN
  Administered 2015-10-17: 10 mL via INTRAVENOUS
  Filled 2015-10-17: qty 10

## 2015-10-17 MED ORDER — HEPARIN SOD (PORK) LOCK FLUSH 100 UNIT/ML IV SOLN
500.0000 [IU] | Freq: Once | INTRAVENOUS | Status: AC
  Administered 2015-10-17: 500 [IU] via INTRAVENOUS
  Filled 2015-10-17: qty 5

## 2015-10-17 NOTE — Telephone Encounter (Signed)
per pfo to sch pt appt-gave pt copy of avs °

## 2015-10-17 NOTE — Progress Notes (Addendum)
[facility] OFFICE PROGRESS NOTE   Diagnosis:  Colon cancer  INTERVAL HISTORY:   [name] returns as scheduled. He feels well. Diarrhea is slowly improving. He has a good appetite and good energy level. He is exercising. He denies pain. He continues Xarelto. No bleeding.  Objective:  Vital signs in last 24 hours:  Blood pressure 116/68, pulse 58, temperature 98 F (36.7 C), temperature source Oral, resp. rate 18, height '5\' 9"'$  (1.753 m), weight 156 lb (70.761 kg), SpO2 100 %.    HEENT: No thrush or ulcers.  Lymphatics: No palpable cervical, supraclavicular or axillary lymph nodes. Resp: Lungs clear bilaterally. Cardio: Regular rate and rhythm. GI: Abdomen soft and nontender. No hepatomegaly. No mass. Vascular: Trace edema left lower leg. Port-A-Cath without erythema.   Lab Results:  Lab Results  Component Value Date   WBC 5.2 03/06/2013   HGB 10.1* 03/06/2013   HCT 30.4* 03/06/2013   MCV 87.6 03/06/2013   PLT 191 03/06/2013   NEUTROABS 4.2 03/06/2013    Imaging:  No results found.  Medications: I have reviewed the patient's current medications.  Assessment/Plan: 1. Stage III (T4, N2) adenocarcinoma of the right colon, status post a right colectomy 11/25/2010. K-ras wild-type.  He began adjuvant FOLFOX chemotherapy 12/31/2010. He completed cycle 12 of FOLFOX on 06/03/2011.  #2-PET scan 12/29/2010 with hypermetabolic retroperitoneal and anterior abdominal adenopathy, postoperative changes of the right colectomy with a possible seroma in the anterior abdomen. Patchy activity was noted in the parotid glands, potentially inflammatory in etiology.  -The hypermetabolic retroperitoneal lymph nodes potentially represented metastatic disease. We presented his case at the GI tumor conference and discussed the case with Dr. Donne Hazel. A decision was made to proceed with "adjuvant" therapy with the plan for a restaging CT at a 3 to four-month interval.  -The  restaging CT on 03/23/2011 revealed a decrease in the periaortic lymphadenopathy, no new adenopathy, and no evidence of liver metastases. This suggests the small retroperitoneal lymph nodes may represent metastatic colon cancer versus resolving "inflammatory" nodes.  -Restaging CT 07/13/2011 revealed increased retroperitoneal lymphadenopathy consistent with metastatic disease.  -Restaging PET scan 07/22/2011 confirmed persistent hypermetabolic retroperitoneal lymph nodes. -Restaging CT 11/12/2011 with a slight increase in the size of retroperitoneal/mesenteric lymph nodes, no other evidence of progressive metastatic disease  -Restaging CT 01/27/2012 with a slight increase in the size of a necrotic celiac node, slight decrease in the para-aortic nodes  -Status post palliative radiation to the abdominal/retroperitoneal lymph nodes completed on 04/04/2012  -Restaging CT 05/31/2012 with a slight decrease in the size of abdominal lymph nodes and a stable mass adjacent to the SMA,? New left lung lesion  -Restaging CT 07/29/2012 with new mediastinal/hilar nodes and a new left lingula nodule. New retrocrural and retroperitoneal nodes.  -initiation of FOLFIRI/panitumumab on an every 2 week schedule 08/17/2012.  -restaging CT 10/19/2012 after 5 cycles of FOLFIRI/panitumumab with improvement in mediastinal adenopathy, a left lingular nodule, retroperitoneal nodes, and a stable node adjacent to the spleen.  -Cycle 10 of FOLFIRI/panitumumab on 12/21/2012  -Restaging CT 01/02/2013 with stable disease.  -PET scan 01/27/2013 showed progression of metastatic disease with increased number and size of mesenteric, retroperitoneal, mediastinal and bilateral hilar lymph nodes. No extranodal metastatic disease was identified.  - treated on protocol with Nivolumab at [facility] CTs on 04/19/2013 confirmed a decreased left lung nodule, a decrease peripancreatic mass, and decreased retroperitoneal  lymphadenopathy  -Continuation on every 2 week Nivolumab at [facility]  -Restaging CT 08/23/2013  with stable disease.  -Restaging CT 11/15/2013 with stable disease.  -Restaging CT 02/07/2014 stable. -Restaging CT 05/02/2014 stable. -Restaging CT 10/17/2014-stable disease -Restaging CT 01/09/2015 stable. 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 A basal cell carcinoma was recently removed from a right finger by Dr. Wilhemina Bonito.  #6-Left leg deep vein thrombosis confirmed on a Doppler ultrasound 01/28/2011. Now on xarelto.  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 He has persistent mild numbness in the fingertips and toes. The numbness does not interfere with activity.  #8-Mild neutropenia on 03/11/2011, he received Neulasta following cycle #6 of FOLFOX. He also received Neulasta support following cycle 8, cycle 10 and cycle #12.  #9-history of Thrombocytopenia secondary to chemotherapy.  #10-Low back pain-most likely related to the necrotic celiac node. He completed palliative radiation on 04/04/2012. The pain resolved. He presented with progressive back pain likely secondary to progression of retrocrural/retroperitoneal adenopathy. The low back pain has resolved.  #11-Fever/rash on 07/29/2012-? contrast reaction.  #12-Clinical and CT evidence of a fractured Port-A-Cath 07/29/2012. The Port-A-Cath was removed on 08/04/2012. There was a linear laceration in the line. New  Port-A-Cath placed on 08/15/2012.  #13-History of skin rash secondary to panitumumab.  #14-Abdominal pain beginning approximately 1 week after treatment occurring with cycles 7 and 8 FOLFIRI/Panitumumab. Status post celiac plexus block 01/12/2013 with no improvement in pain. He is status post a celiac block in interventional radiology on 02/14/2013. The pain has resolved. He is no longer taking pain medication.  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 appears well. He remains in clinical remission from colon cancer. Port-A-Cath will be flushed today. He is scheduled for labs/restaging CT scans and a follow-up visit with Dr. Reynaldo Minium 12/05/2015. He will return for a follow-up visit here in 12 weeks with a Port-A-Cath flush. He will contact the office in the interim with any problems.  Patient seen with Dr. Benay Spice.    Jubal, Rademaker ANP/GNP-BC   10/17/2015  3:47 PM  This was a shared visit with Ned Card. Mr. vibratory appears stable. He will undergo restaging CTs at [facility].  Julieanne Manson, M.D.

## 2015-10-17 NOTE — Patient Instructions (Signed)

## 2016-01-16 ENCOUNTER — Ambulatory Visit (HOSPITAL_BASED_OUTPATIENT_CLINIC_OR_DEPARTMENT_OTHER): Payer: BLUE CROSS/BLUE SHIELD

## 2016-01-16 ENCOUNTER — Ambulatory Visit (HOSPITAL_BASED_OUTPATIENT_CLINIC_OR_DEPARTMENT_OTHER): Payer: BLUE CROSS/BLUE SHIELD | Admitting: Oncology

## 2016-01-16 ENCOUNTER — Telehealth: Payer: Self-pay | Admitting: Oncology

## 2016-01-16 VITALS — BP 117/70 | HR 53 | Temp 98.3°F | Resp 18 | Ht 69.0 in | Wt 157.4 lb

## 2016-01-16 DIAGNOSIS — D509 Iron deficiency anemia, unspecified: Secondary | ICD-10-CM

## 2016-01-16 DIAGNOSIS — C189 Malignant neoplasm of colon, unspecified: Secondary | ICD-10-CM | POA: Diagnosis not present

## 2016-01-16 DIAGNOSIS — M25561 Pain in right knee: Secondary | ICD-10-CM

## 2016-01-16 DIAGNOSIS — M25521 Pain in right elbow: Secondary | ICD-10-CM | POA: Diagnosis not present

## 2016-01-16 DIAGNOSIS — Z1509 Genetic susceptibility to other malignant neoplasm: Secondary | ICD-10-CM

## 2016-01-16 MED ORDER — HEPARIN SOD (PORK) LOCK FLUSH 100 UNIT/ML IV SOLN
500.0000 [IU] | Freq: Once | INTRAVENOUS | Status: AC
Start: 2016-01-16 — End: 2016-01-16
  Administered 2016-01-16: 500 [IU] via INTRAVENOUS
  Filled 2016-01-16: qty 5

## 2016-01-16 MED ORDER — SODIUM CHLORIDE 0.9% FLUSH
10.0000 mL | INTRAVENOUS | Status: DC | PRN
  Administered 2016-01-16: 10 mL via INTRAVENOUS
  Filled 2016-01-16: qty 10

## 2016-01-16 NOTE — Patient Instructions (Signed)

## 2016-01-16 NOTE — Progress Notes (Signed)
[place] OFFICE PROGRESS NOTE   Diagnosis: Colon cancer  INTERVAL HISTORY:   Mr.Terry Vargas returns as scheduled. He feels well. He is working. He plays tennis and exercises. He has discomfort at the right elbow and right knee. He feels the elbow pain is related to "tennis elbow ". He continues Xarelto anticoagulation. He remains off of treatment for colon cancer. He underwent restaging CTs at [facility] on 12/05/2015. There was no evidence of disease progression.  He has frequent bowel movements after eating. He continues Imodium and Lomotil.  Objective:  Vital signs in last 24 hours:  Blood pressure 117/70, pulse (!) 53, temperature 98.3 F (36.8 C), temperature source Oral, resp. rate 18, height 5' 9"  (1.753 m), weight 157 lb 6.4 oz (71.4 kg), SpO2 98 %.    HEENT:  neck without mass Lymphatics:  no cervical or supraclavicular nodes Resp:  lungs clear bilaterally Cardio:  regular rate and rhythm GI:  no hepatosplenomegaly, no mass, nontender Vascular:  the left lower leg is larger than the right side  Skin:Mild fine erythematous rash over the chest  Musculoskeletal: Right knee without edema  Portacath/PICC-without erythema  Lab Results: 12/05/2015 at [facility]: Hemoglobin 12.7, MCV 90, platelets 125,000  Medications: I have reviewed the patient's current medications.  Assessment/Plan: 1. Stage III (T4, N2) adenocarcinoma of the right colon, status post a right colectomy 11/25/2010. K-ras wild-type.  He began adjuvant FOLFOX chemotherapy 12/31/2010. He completed cycle 12 of FOLFOX on 06/03/2011.  #2-PET scan 12/29/2010 with hypermetabolic retroperitoneal and anterior abdominal adenopathy, postoperative changes of the right colectomy with a possible seroma in the anterior abdomen. Patchy activity was noted in the parotid glands, potentially inflammatory in etiology.  -The hypermetabolic retroperitoneal lymph nodes potentially represented metastatic disease. We  presented his case at the GI tumor conference and discussed the case with Dr. Donne Hazel. A decision was made to proceed with "adjuvant" therapy with the plan for a restaging CT at a 3 to four-month interval.  -The restaging CT on 03/23/2011 revealed a decrease in the periaortic lymphadenopathy, no new adenopathy, and no evidence of liver metastases. This suggests the small retroperitoneal lymph nodes may represent metastatic colon cancer versus resolving "inflammatory" nodes.  -Restaging CT 07/13/2011 revealed increased retroperitoneal lymphadenopathy consistent with metastatic disease.  -Restaging PET scan 07/22/2011 confirmed persistent hypermetabolic retroperitoneal lymph nodes. -Restaging CT 11/12/2011 with a slight increase in the size of retroperitoneal/mesenteric lymph nodes, no other evidence of progressive metastatic disease  -Restaging CT 01/27/2012 with a slight increase in the size of a necrotic celiac node, slight decrease in the para-aortic nodes  -Status post palliative radiation to the abdominal/retroperitoneal lymph nodes completed on 04/04/2012  -Restaging CT 05/31/2012 with a slight decrease in the size of abdominal lymph nodes and a stable mass adjacent to the SMA,? New left lung lesion  -Restaging CT 07/29/2012 with new mediastinal/hilar nodes and a new left lingula nodule. New retrocrural and retroperitoneal nodes.  -initiation of FOLFIRI/panitumumab on an every 2 week schedule 08/17/2012.  -restaging CT 10/19/2012 after 5 cycles of FOLFIRI/panitumumab with improvement in mediastinal adenopathy, a left lingular nodule, retroperitoneal nodes, and a stable node adjacent to the spleen.  -Cycle 10 of FOLFIRI/panitumumab on 12/21/2012  -Restaging CT 01/02/2013 with stable disease.  -PET scan 01/27/2013 showed progression of metastatic disease with increased number and size of mesenteric, retroperitoneal, mediastinal and bilateral hilar lymph nodes. No extranodal metastatic  disease was identified.  - treated on protocol with Nivolumab at [place] CTs on 04/19/2013 confirmed  a decreased left lung nodule, a decrease peripancreatic mass, and decreased retroperitoneal lymphadenopathy  -Continuation on every 2 week Nivolumab at [facility]  -Restaging CT 08/23/2013 with stable disease.  -Restaging CT 11/15/2013 with stable disease.  -Restaging CT 02/07/2014 stable. -Restaging CT 05/02/2014 stable. -Restaging CT 10/17/2014-stable disease -Restaging CT 01/09/2015 stable. 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 A basal cell carcinoma was recently removed from a right finger by Dr. Wilhemina Bonito.  #6-Left leg deep vein thrombosis confirmed on a Doppler ultrasound 01/28/2011. Now on xarelto.  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 He has persistent mild numbness in the fingertips and toes. The numbness does not interfere with activity.  #8-Mild neutropenia on 03/11/2011, he received Neulasta following cycle #6 of FOLFOX. He also received Neulasta support following cycle 8, cycle 10 and cycle #12.  #9-history of Thrombocytopenia secondary to chemotherapy.  #10-Low back pain-most likely related to the necrotic celiac node. He completed palliative radiation on 04/04/2012. The pain resolved. He presented with progressive back pain likely secondary to progression of retrocrural/retroperitoneal  adenopathy. The low back pain has resolved.  #11-Fever/rash on 07/29/2012-? contrast reaction.  #12-Clinical and CT evidence of a fractured Port-A-Cath 07/29/2012. The Port-A-Cath was removed on 08/04/2012. There was a linear laceration in the line. New Port-A-Cath placed on 08/15/2012.  #13-History of skin rash secondary to panitumumab.  #14-Abdominal pain beginning approximately 1 week after treatment occurring with cycles 7 and 8 FOLFIRI/Panitumumab. Status post celiac plexus block 01/12/2013 with no improvement in pain. He is status post a celiac block in interventional radiology on 02/14/2013. The pain has resolved. He is no longer taking pain medication.  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 remains in clinical remission from colon cancer. He will continue imaging follow-up at [facility]. We flush the Port-A-Cath today. He will return for an office visit and Port-A-Cath flush 04/16/2016.  I suspect the right elbow and right knee pain are related to tenderness and his exercise. We will make a referral to the sports medicine clinic.   He brought in a log of blood pressure readings. He does not have elevated blood pressure readings at home and his blood pressure have been low here over the past few years. He will discontinue amlodipine and continue checking his blood pressure at home.   Betsy Coder, MD  01/16/2016  3:48 PM

## 2016-01-16 NOTE — Telephone Encounter (Signed)
GAVE PATIENT AVS REPORT, APPOINTMENTS FOR November AND FOR 9/11 WITH DR DRAPER AT [facility]

## 2016-01-16 NOTE — Addendum Note (Signed)
Addended by: Brien Few on: 01/16/2016 04:49 PM   Modules accepted: Orders

## 2016-01-27 ENCOUNTER — Ambulatory Visit
Admission: RE | Admit: 2016-01-27 | Discharge: 2016-01-27 | Disposition: A | Discharge status: Home / Self Care | Payer: BLUE CROSS/BLUE SHIELD | Source: Ambulatory Visit | Attending: Sports Medicine | Admitting: Sports Medicine

## 2016-01-27 ENCOUNTER — Encounter: Payer: Self-pay | Admitting: Sports Medicine

## 2016-01-27 ENCOUNTER — Other Ambulatory Visit: Payer: Self-pay | Admitting: Sports Medicine

## 2016-01-27 ENCOUNTER — Ambulatory Visit (INDEPENDENT_AMBULATORY_CARE_PROVIDER_SITE_OTHER): Payer: BLUE CROSS/BLUE SHIELD | Admitting: Sports Medicine

## 2016-01-27 VITALS — BP 138/95 | HR 54 | Ht 69.0 in | Wt 157.0 lb

## 2016-01-27 DIAGNOSIS — M25561 Pain in right knee: Secondary | ICD-10-CM

## 2016-01-27 NOTE — Assessment & Plan Note (Signed)
Likely secondary to medial meniscal tear, given his mechanism of injury, morning stiffness, and +McMurray's and Thessaly's tests. Pt has a history of DVT in his LLE, but he does not have any warmth, erythema, or tenderness of his calf. - Will obtain right knee x-rays, given his history of metastatic colon cancer - Avoid NSAIDs in this patient because he is on Xarelto for hx of left leg DVT. - Recommend knee compression sleeve during activity - Follow-up in 3-4 weeks. Can consider MRI or knee injection at that time.

## 2016-01-27 NOTE — Progress Notes (Signed)
   [facility] Phone: [phone]  Subjective:  Patient is a 55 year old male presenting with right knee pain for the last month. One month ago, he was running when he noticed a sharp pain in the right medial knee. He did not twist or injure his knee at this time, and he did not notice any popping. Since that time, he has had intermittent knee pain that is worse in the morning. He also notes knee stiffness worse in the morning that improves as the day goes on. He has noticed some increased popping, but his knee has not been giving out. He has stopped doing squats, but has continued to bike and do leg extensions. He has been applying heat and icy hot to the knee, which hasn't really helped. He has also taken Ibuprofen 1-2 times in the last month. He denies any swelling, redness, or warmth.   ROS: See HPI for pertinent positives and negatives  Past Medical History- significant for stage 4 colon cancer (just completed radiation therapy)  Social history- patient is a never smoker  Objective: BP (!) 138/95   Pulse (!) 54   Ht 5\' 9"  (1.753 m)   Wt 157 lb (71.2 kg)   BMI 23.18 kg/m  Gen: NAD, alert, cooperative with exam Right Knee: No erythema, edema, or gross deformity noted. Normal ROM. Mild crepitus noted with extension at the knee. Tenderness to palpation along the medial joint line. No tenderness to palpation of the calf muscle. Lachman's test negative, valgus and varus stress tests normal, +McMurray's test, +Thessaly's test Neuro: 5/5 strength in the lower extremities bilaterally  Assessment/Plan: 1. Right Knee Pain- likely secondary to medial meniscal tear, given his mechanism of injury, morning stiffness, and +McMurray's and Thessaly's tests. Pt has a history of DVT in his LLE, but he does not have any warmth, erythema, or tenderness of his calf. - Will obtain right knee x-rays, given his history of metastatic colon cancer - Avoid NSAIDs in this patient because he  is on Xarelto for hx of left leg DVT. - Recommend knee compression sleeve during activity - Follow-up in 3-4 weeks. Can consider MRI or knee injection at that time.   Hyman Bible, MD PGY-2  Patient seen and evaluated with the resident. I agree with the above plan of care.

## 2016-01-30 ENCOUNTER — Telehealth: Payer: Self-pay | Admitting: Sports Medicine

## 2016-01-30 NOTE — Telephone Encounter (Signed)
I spoke with the patient yesterday on the phone after reviewing the x-ray of his right knee. He does have some medial joint space narrowing consistent with osteoarthritis. No evidence of metastatic colon cancer. Patient will continue with treatment as outlined in my previous office note and will follow-up with me in a few weeks. If his pain does not continue to improve, we may consider an intra-articular cortisone injection.

## 2016-02-24 ENCOUNTER — Ambulatory Visit (INDEPENDENT_AMBULATORY_CARE_PROVIDER_SITE_OTHER): Payer: BLUE CROSS/BLUE SHIELD | Admitting: Sports Medicine

## 2016-02-24 VITALS — BP 144/84

## 2016-02-24 DIAGNOSIS — M7711 Lateral epicondylitis, right elbow: Secondary | ICD-10-CM

## 2016-02-24 NOTE — Progress Notes (Signed)
   Subjective:    Patient ID: Terry Vargas, male    DOB: [birth year], 55 y.o.   MRN: [id number]  HPI   Patient comes in today for follow-up on right knee pain. Recent x-rays of his right knee showed some medial joint space narrowing consistent with DJD. His pain has resolved with his home exercises. He still has a little stiffness in his knee but he has been able to return to all activity without any complaint in regards to his knee. His main complaint today is lateral right elbow pain. His pain began after he played a 2-1/2 hour tennis match which is unusual for him. That was 2 weeks ago. Since that time he has had lateral elbow pain that is present with wrist related activity. He has been icing. He has only played tennis one time since his injury. He has not noticed any swelling. No history of lateral epicondylitis in the past. No medial elbow pain.  Past medical history reviewed Medications reviewed Allergies reviewed    Review of Systems    as above Objective:   Physical Exam  Well developed, well-nourished. No acute distress  Right knee: Full range of motion. No obvious effusion. No tenderness to palpation along medial or lateral joint lines. Good joint stability.  Right elbow: Full range of motion. No effusion. Mild amount of swelling over the lateral epicondyle. He is tender to palpation directly over the lateral epicondyle and has reproducible pain with resisted ECRB testing. No tenderness over the medial epicondyle. Good radial and ulnar pulses.      Assessment & Plan:   Improved right knee pain secondary to DJD Right elbow pain secondary to lateral epicondylitis  I've given the patient a comprehensive home exercise program with all 3 phases of rehabilitation for his lateral epicondylitis. I think he should refrain from tennis for the next 4 weeks. I recommended daily icing for the next 7 days. Also recommended topical Aspercreme and a counterforce brace to be worn with  activity. If his symptoms persist for another 3-4 weeks then I would consider a cortisone injection. Follow-up for ongoing or recalcitrant issues.

## 2016-02-25 MED ORDER — DICLOFENAC SODIUM 1 % TD GEL: 2.0000 g | Freq: Four times a day (QID) | TRANSDERMAL | 3 refills | Status: DC

## 2016-02-25 NOTE — Addendum Note (Signed)
Addended bySela Hua on: 02/25/2016 08:56 AM   Modules accepted: Orders

## 2016-04-16 ENCOUNTER — Ambulatory Visit (HOSPITAL_BASED_OUTPATIENT_CLINIC_OR_DEPARTMENT_OTHER): Payer: BLUE CROSS/BLUE SHIELD

## 2016-04-16 ENCOUNTER — Ambulatory Visit (HOSPITAL_BASED_OUTPATIENT_CLINIC_OR_DEPARTMENT_OTHER): Payer: BLUE CROSS/BLUE SHIELD | Admitting: Nurse Practitioner

## 2016-04-16 ENCOUNTER — Telehealth: Payer: Self-pay | Admitting: Oncology

## 2016-04-16 VITALS — BP 118/79 | HR 52 | Temp 98.4°F | Resp 18 | Ht 69.0 in | Wt 152.8 lb

## 2016-04-16 DIAGNOSIS — Z86718 Personal history of other venous thrombosis and embolism: Secondary | ICD-10-CM

## 2016-04-16 DIAGNOSIS — D509 Iron deficiency anemia, unspecified: Secondary | ICD-10-CM

## 2016-04-16 DIAGNOSIS — C189 Malignant neoplasm of colon, unspecified: Secondary | ICD-10-CM

## 2016-04-16 DIAGNOSIS — Z7901 Long term (current) use of anticoagulants: Secondary | ICD-10-CM

## 2016-04-16 DIAGNOSIS — Z95828 Presence of other vascular implants and grafts: Secondary | ICD-10-CM | POA: Insufficient documentation

## 2016-04-16 MED ORDER — HEPARIN SOD (PORK) LOCK FLUSH 100 UNIT/ML IV SOLN
500.0000 [IU] | Freq: Once | INTRAVENOUS | Status: AC | PRN
  Administered 2016-04-16: 500 [IU] via INTRAVENOUS
  Filled 2016-04-16: qty 5

## 2016-04-16 MED ORDER — SODIUM CHLORIDE 0.9% FLUSH
10.0000 mL | INTRAVENOUS | Status: DC | PRN
  Administered 2016-04-16: 10 mL via INTRAVENOUS
  Filled 2016-04-16: qty 10

## 2016-04-16 NOTE — Telephone Encounter (Signed)
Appointments scheduled per 04/16/16 los. A copy of the AVS report and appointment schedule was given to the patient, per 04/16/16 los.

## 2016-04-16 NOTE — Progress Notes (Addendum)
Terry Vargas OFFICE PROGRESS NOTE   Diagnosis:  Colon cancer  INTERVAL HISTORY:   [name] returns as scheduled. He feels well. He remains very active. He reports "rare" diarrhea. He has frequent formed stools. He is taking Imodium and Lomotil 3 times a day. No nausea or vomiting. No abdominal pain. He continues Xarelto. He denies any bleeding.  Objective:  Vital signs in last 24 hours:  Blood pressure 118/79, pulse (!) 52, temperature 98.4 F (36.9 C), resp. rate 18, height 5' 9"  (1.753 m), weight 152 lb 12.8 oz (69.3 kg), SpO2 100 %.    HEENT: No thrush or ulcers. Lymphatics: No palpable cervical, supra clavicular or axillar lymph nodes. Resp: Lungs clear bilaterally. Cardio: Regular rate and rhythm. GI: Abdomen soft and nontender. No hepatomegaly. No mass. Vascular: No leg edema. The left lower leg is slightly larger than the right lower leg. Port-A-Cath without erythema.   Lab Results:  Lab Results  Component Value Date   WBC 5.2 03/06/2013   HGB 10.1 (L) 03/06/2013   HCT 30.4 (L) 03/06/2013   MCV 87.6 03/06/2013   PLT 191 03/06/2013   NEUTROABS 4.2 03/06/2013    Imaging:  No results found.  Medications: I have reviewed the patient's current medications.  Assessment/Plan: 1. Stage III (T4, N2) adenocarcinoma of the right colon, status post a right colectomy 11/25/2010. K-ras wild-type.  He began adjuvant FOLFOX chemotherapy 12/31/2010. He completed cycle 12 of FOLFOX on 06/03/2011.  #2-PET scan 12/29/2010 with hypermetabolic retroperitoneal and anterior abdominal adenopathy, postoperative changes of the right colectomy with a possible seroma in the anterior abdomen. Patchy activity was noted in the parotid glands, potentially inflammatory in etiology.  -The hypermetabolic retroperitoneal lymph nodes potentially represented metastatic disease. We presented his case at the GI tumor conference and discussed the case with Dr. Donne Hazel. A decision  was made to proceed with "adjuvant" therapy with the plan for a restaging CT at a 3 to four-month interval.  -The restaging CT on 03/23/2011 revealed a decrease in the periaortic lymphadenopathy, no new adenopathy, and no evidence of liver metastases. This suggests the small retroperitoneal lymph nodes may represent metastatic colon cancer versus resolving "inflammatory" nodes.  -Restaging CT 07/13/2011 revealed increased retroperitoneal lymphadenopathy consistent with metastatic disease.  -Restaging PET scan 07/22/2011 confirmed persistent hypermetabolic retroperitoneal lymph nodes. -Restaging CT 11/12/2011 with a slight increase in the size of retroperitoneal/mesenteric lymph nodes, no other evidence of progressive metastatic disease  -Restaging CT 01/27/2012 with a slight increase in the size of a necrotic celiac node, slight decrease in the para-aortic nodes  -Status post palliative radiation to the abdominal/retroperitoneal lymph nodes completed on 04/04/2012  -Restaging CT 05/31/2012 with a slight decrease in the size of abdominal lymph nodes and a stable mass adjacent to the SMA,? New left lung lesion  -Restaging CT 07/29/2012 with new mediastinal/hilar nodes and a new left lingula nodule. New retrocrural and retroperitoneal nodes.  -initiation of FOLFIRI/panitumumab on an every 2 week schedule 08/17/2012.  -restaging CT 10/19/2012 after 5 cycles of FOLFIRI/panitumumab with improvement in mediastinal adenopathy, a left lingular nodule, retroperitoneal nodes, and a stable node adjacent to the spleen.  -Cycle 10 of FOLFIRI/panitumumab on 12/21/2012  -Restaging CT 01/02/2013 with stable disease.  -PET scan 01/27/2013 showed progression of metastatic disease with increased number and size of mesenteric, retroperitoneal, mediastinal and bilateral hilar lymph nodes. No extranodal metastatic disease was identified.  - treated on protocol with Nivolumab at [place] CT[facility] on  04/19/2013 confirmed a  decreased left lung nodule, a decrease peripancreatic mass, and decreased retroperitoneal lymphadenopathy  -Continuation on every 2 week Nivolumab at [facility]  -Restaging CT 08/23/2013 with stable disease.  -Restaging CT 11/15/2013 with stable disease.  -Restaging CT 02/07/2014 stable. -Restaging CT 05/02/2014 stable. -Restaging CT 10/17/2014-stable disease -Restaging CT 01/09/2015 stable. 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 A basal cell carcinoma was recently removed from a right finger by Dr. Wilhemina Bonito.  #6-Left leg deep vein thrombosis confirmed on a Doppler ultrasound 01/28/2011. Now on xarelto.  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 He has persistent mild numbness in the fingertips and toes. The numbness does not interfere with activity.  #8-Mild neutropenia on 03/11/2011, he received Neulasta following cycle #6 of FOLFOX. He also received Neulasta support following cycle 8, cycle 10 and cycle #12.  #9-history of Thrombocytopenia secondary to chemotherapy.  #10-Low back pain-most likely related to the necrotic celiac node. He completed palliative radiation on 04/04/2012. The pain resolved. He presented with progressive back pain likely secondary to progression of retrocrural/retroperitoneal adenopathy. The low back pain  has resolved.  #11-Fever/rash on 07/29/2012-? contrast reaction.  #12-Clinical and CT evidence of a fractured Port-A-Cath 07/29/2012. The Port-A-Cath was removed on 08/04/2012. There was a linear laceration in the line. New Port-A-Cath placed on 08/15/2012.  #13-History of skin rash secondary to panitumumab.  #14-Abdominal pain beginning approximately 1 week after treatment occurring with cycles 7 and 8 FOLFIRI/Panitumumab. Status post celiac plexus block 01/12/2013 with no improvement in pain. He is status post a celiac block in interventional radiology on 02/14/2013. The pain has resolved. He is no longer taking pain medication.  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 appears well. There is no clinical evidence of disease progression. He continues follow-up at [facility] with Dr. Reynaldo Minium, next visit/imaging 05/27/2016. He will return for a follow-up visit and Port-A-Cath flush here in approximately 12 weeks.  Patient seen with Dr. Benay Spice.    Vince, Ainsley ANP/GNP-BC   04/16/2016  4:00 PM This was a shared visit with Ned Card. He remains in clinical remission from colon cancer. I will discuss the indication for continuing Xarelto Dr. Reynaldo Minium.  Julieanne Manson, M.D.

## 2016-04-17 ENCOUNTER — Telehealth: Payer: Self-pay

## 2016-04-17 NOTE — Telephone Encounter (Signed)
Called pt to go over meds with patient. No answer, mailbox is full

## 2016-04-17 NOTE — Telephone Encounter (Signed)
-----   Message from Owens Shark, NP sent at 04/17/2016  9:06 AM EST ----- Please let him know Dr. Benay Spice contacted Dr. Reynaldo Minium regarding Xarelto and Prilosec. Both Xarelto and Prilosec can be discontinued. He should contact our office or Dr. Margarette Canada office if he develops any signs/symptoms of a blood clot or any GI symptoms after discontinuing these medications.

## 2016-04-21 NOTE — Telephone Encounter (Signed)
Called and informed pt about prilosec and xarelto. Pt confirmed and will discontinue both medications, pt understands to call Dr. Reynaldo Minium if he develops any GI symptoms or symptoms of blood clots.

## 2016-04-21 NOTE — Telephone Encounter (Signed)
-----   Message from Owens Shark, NP sent at 04/17/2016  9:06 AM EST ----- Please let him know Dr. Benay Spice contacted Dr. Reynaldo Minium regarding Xarelto and Prilosec. Both Xarelto and Prilosec can be discontinued. He should contact our office or Dr. Margarette Canada office if he develops any signs/symptoms of a blood clot or any GI symptoms after discontinuing these medications.

## 2016-06-05 ENCOUNTER — Other Ambulatory Visit: Payer: Self-pay | Admitting: Nurse Practitioner

## 2016-07-09 ENCOUNTER — Ambulatory Visit (HOSPITAL_BASED_OUTPATIENT_CLINIC_OR_DEPARTMENT_OTHER): Payer: BLUE CROSS/BLUE SHIELD

## 2016-07-09 ENCOUNTER — Other Ambulatory Visit: Payer: BLUE CROSS/BLUE SHIELD

## 2016-07-09 ENCOUNTER — Ambulatory Visit (HOSPITAL_BASED_OUTPATIENT_CLINIC_OR_DEPARTMENT_OTHER): Payer: BLUE CROSS/BLUE SHIELD | Admitting: Oncology

## 2016-07-09 ENCOUNTER — Telehealth: Payer: Self-pay | Admitting: Oncology

## 2016-07-09 VITALS — BP 130/88 | HR 55 | Temp 97.9°F | Resp 16 | Wt 156.7 lb

## 2016-07-09 DIAGNOSIS — R197 Diarrhea, unspecified: Secondary | ICD-10-CM | POA: Diagnosis not present

## 2016-07-09 DIAGNOSIS — Z95828 Presence of other vascular implants and grafts: Secondary | ICD-10-CM

## 2016-07-09 DIAGNOSIS — C189 Malignant neoplasm of colon, unspecified: Secondary | ICD-10-CM

## 2016-07-09 MED ORDER — SODIUM CHLORIDE 0.9% FLUSH
10.0000 mL | INTRAVENOUS | Status: DC | PRN
  Administered 2016-07-09: 10 mL via INTRAVENOUS
  Filled 2016-07-09: qty 10

## 2016-07-09 MED ORDER — HEPARIN SOD (PORK) LOCK FLUSH 100 UNIT/ML IV SOLN
500.0000 [IU] | Freq: Once | INTRAVENOUS | Status: AC | PRN
  Administered 2016-07-09: 500 [IU] via INTRAVENOUS
  Filled 2016-07-09: qty 5

## 2016-07-09 NOTE — Telephone Encounter (Signed)
Appointments scheduled per 2/22 LOS. Patient given AVS report and calendars with future schedule appointments.

## 2016-07-09 NOTE — Progress Notes (Signed)
Terry Vargas OFFICE PROGRESS NOTE   Diagnosis: Colon cancer  INTERVAL HISTORY:   Terry Vargas returns as scheduled. He feels well. He is working and exercising. No pain. He has intermittent diarrhea. He has diarrhea after eating certain foods. He takes Imodium and Lomotil twice daily. Restaging CTs at [facility] 05/27/2016 revealed no evidence of recurrent disease.  Objective:  Vital signs in last 24 hours:  Blood pressure 130/88, pulse (!) 55, temperature 97.9 F (36.6 C), temperature source Oral, resp. rate 16, weight 156 lb 11.2 oz (71.1 kg), SpO2 100 %.    HEENT: Neck without mass Lymphatics: No cervical, supraclavicular, axillary, or inguinal nodes Resp: Lungs clear bilaterally Cardio: Regular rate and rhythm GI: No hepatosplenomegaly, no mass, nontender Vascular: No leg edema  Skin: Mild erythematous rash over the trunk   Portacath/PICC-without erythema  Lab Results:  Lab Results  Component Value Date   WBC 5.2 03/06/2013   HGB 10.1 (L) 03/06/2013   HCT 30.4 (L) 03/06/2013   MCV 87.6 03/06/2013   PLT 191 03/06/2013   NEUTROABS 4.2 03/06/2013    Lab Results  Component Value Date   NA 139 12/21/2012    No results found for: CEA1  Imaging:  No results found.  Medications: I have reviewed the patient's current medications.  Assessment/Plan: 1. Stage III (T4, N2) adenocarcinoma of the right colon, status post a right colectomy 11/25/2010. K-ras wild-type.  He began adjuvant FOLFOX chemotherapy 12/31/2010. He completed cycle 12 of FOLFOX on 06/03/2011.  #2-PET scan 12/29/2010 with hypermetabolic retroperitoneal and anterior abdominal adenopathy, postoperative changes of the right colectomy with a possible seroma in the anterior abdomen. Patchy activity was noted in the parotid glands, potentially inflammatory in etiology.  -The hypermetabolic retroperitoneal lymph nodes potentially represented metastatic disease. We presented his case at the GI  tumor conference and discussed the case with Dr. Donne Hazel. A decision was made to proceed with "adjuvant" therapy with the plan for a restaging CT at a 3 to four-month interval.  -The restaging CT on 03/23/2011 revealed a decrease in the periaortic lymphadenopathy, no new adenopathy, and no evidence of liver metastases. This suggests the small retroperitoneal lymph nodes may represent metastatic colon cancer versus resolving "inflammatory" nodes.  -Restaging CT 07/13/2011 revealed increased retroperitoneal lymphadenopathy consistent with metastatic disease.  -Restaging PET scan 07/22/2011 confirmed persistent hypermetabolic retroperitoneal lymph nodes. -Restaging CT 11/12/2011 with a slight increase in the size of retroperitoneal/mesenteric lymph nodes, no other evidence of progressive metastatic disease  -Restaging CT 01/27/2012 with a slight increase in the size of a necrotic celiac node, slight decrease in the para-aortic nodes  -Status post palliative radiation to the abdominal/retroperitoneal lymph nodes completed on 04/04/2012  -Restaging CT 05/31/2012 with a slight decrease in the size of abdominal lymph nodes and a stable mass adjacent to the SMA,? New left lung lesion  -Restaging CT 07/29/2012 with new mediastinal/hilar nodes and a new left lingula nodule. New retrocrural and retroperitoneal nodes.  -initiation of FOLFIRI/panitumumab on an every 2 week schedule 08/17/2012.  -restaging CT 10/19/2012 after 5 cycles of FOLFIRI/panitumumab with improvement in mediastinal adenopathy, a left lingular nodule, retroperitoneal nodes, and a stable node adjacent to the spleen.  -Cycle 10 of FOLFIRI/panitumumab on 12/21/2012  -Restaging CT 01/02/2013 with stable disease.  -PET scan 01/27/2013 showed progression of metastatic disease with increased number and size of mesenteric, retroperitoneal, mediastinal and bilateral hilar lymph nodes. No extranodal metastatic disease was identified.  -  treated on protocol with Nivolumab at [facility]  -  Restaging CTs on 04/19/2013 confirmed a decreased left lung nodule, a decrease peripancreatic mass, and decreased retroperitoneal lymphadenopathy  -Continuation on every 2 week Nivolumab at [facility]  -Restaging CT 08/23/2013 with stable disease.  -Restaging CT 11/15/2013 with stable disease.  -Restaging CT 02/07/2014 stable. -Restaging CT 05/02/2014 stable. -Restaging CT 10/17/2014-stable disease -Restaging CT 01/09/2015 stable. 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 A basal cell carcinoma was recently removed from a right finger by Dr. Wilhemina Bonito.  #6-Left leg deep vein thrombosis confirmed on a Doppler ultrasound 01/28/2011. Now on xarelto.  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 He has persistent mild numbness in the fingertips and toes. The numbness does not interfere with activity.  #8-Mild neutropenia on 03/11/2011, he received Neulasta following cycle #6 of FOLFOX. He also received Neulasta support following cycle 8, cycle 10 and cycle #12.  #9-history of Thrombocytopenia secondary to chemotherapy.  #10-Low back pain-most likely related to the necrotic celiac node. He completed palliative radiation on 04/04/2012. The pain resolved. He  presented with progressive back pain likely secondary to progression of retrocrural/retroperitoneal adenopathy. The low back pain has resolved.  #11-Fever/rash on 07/29/2012-? contrast reaction.  #12-Clinical and CT evidence of a fractured Port-A-Cath 07/29/2012. The Port-A-Cath was removed on 08/04/2012. There was a linear laceration in the line. New Port-A-Cath placed on 08/15/2012.  #13-History of skin rash secondary to panitumumab.  #14-Abdominal pain beginning approximately 1 week after treatment occurring with cycles 7 and 8 FOLFIRI/Panitumumab. Status post celiac plexus block 01/12/2013 with no improvement in pain. He is status post a celiac block in interventional radiology on 02/14/2013. The pain has resolved. He is no longer taking pain medication.  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 He continues CT follow-up with Dr. Reynaldo Minium. The next CT is scheduled 09/23/2016. He will return for a Port-A-Cath flush here on 08/20/2016 and an office visit/Port-A-Cath flush 11/12/2016.  He will discuss the indication for a surveillance colonoscopy with Dr. Reynaldo Minium. He believes he had a colonoscopy prior to the gastrojejunostomy procedure last year. I cannot find documentation of this in the electronic record.  Julieanne Manson, M.D.  07/09/2016  3:23 PM

## 2016-08-07 ENCOUNTER — Encounter: Payer: Self-pay | Admitting: Nurse Practitioner

## 2016-08-11 ENCOUNTER — Telehealth: Payer: Self-pay | Admitting: Nurse Practitioner

## 2016-08-11 NOTE — Telephone Encounter (Signed)
R/s appt per patient request. Patient is aware of new appt time and date.

## 2016-08-18 ENCOUNTER — Ambulatory Visit (HOSPITAL_BASED_OUTPATIENT_CLINIC_OR_DEPARTMENT_OTHER): Payer: BLUE CROSS/BLUE SHIELD

## 2016-08-18 DIAGNOSIS — C189 Malignant neoplasm of colon, unspecified: Secondary | ICD-10-CM | POA: Diagnosis not present

## 2016-08-18 DIAGNOSIS — Z452 Encounter for adjustment and management of vascular access device: Secondary | ICD-10-CM | POA: Diagnosis not present

## 2016-08-18 DIAGNOSIS — Z95828 Presence of other vascular implants and grafts: Secondary | ICD-10-CM

## 2016-08-18 MED ORDER — HEPARIN SOD (PORK) LOCK FLUSH 100 UNIT/ML IV SOLN
500.0000 [IU] | Freq: Once | INTRAVENOUS | Status: AC | PRN
  Administered 2016-08-18: 500 [IU] via INTRAVENOUS
  Filled 2016-08-18: qty 5

## 2016-08-18 MED ORDER — SODIUM CHLORIDE 0.9% FLUSH
10.0000 mL | INTRAVENOUS | Status: DC | PRN
  Administered 2016-08-18: 10 mL via INTRAVENOUS
  Filled 2016-08-18: qty 10

## 2016-10-16 ENCOUNTER — Telehealth: Payer: Self-pay | Admitting: Oncology

## 2016-10-16 ENCOUNTER — Encounter: Payer: Self-pay | Admitting: Oncology

## 2016-10-16 NOTE — Telephone Encounter (Signed)
Appointment duration changed from 30 mins to 60 mins, per [place].

## 2016-10-20 ENCOUNTER — Other Ambulatory Visit: Payer: Self-pay | Admitting: *Deleted

## 2016-10-20 DIAGNOSIS — Z1509 Genetic susceptibility to other malignant neoplasm: Secondary | ICD-10-CM

## 2016-11-08 ENCOUNTER — Encounter: Payer: Self-pay | Admitting: Oncology

## 2016-11-12 ENCOUNTER — Ambulatory Visit: Payer: BLUE CROSS/BLUE SHIELD | Admitting: Nurse Practitioner

## 2017-01-19 ENCOUNTER — Encounter: Payer: Self-pay | Admitting: Oncology

## 2017-03-15 ENCOUNTER — Encounter: Payer: Self-pay | Admitting: Genetic Counselor

## 2017-03-30 ENCOUNTER — Telehealth: Payer: Self-pay | Admitting: Oncology

## 2017-03-30 ENCOUNTER — Ambulatory Visit (HOSPITAL_BASED_OUTPATIENT_CLINIC_OR_DEPARTMENT_OTHER): Payer: BLUE CROSS/BLUE SHIELD | Admitting: Oncology

## 2017-03-30 VITALS — BP 146/90 | HR 51 | Temp 98.1°F | Resp 16 | Ht 69.0 in | Wt 157.6 lb

## 2017-03-30 DIAGNOSIS — C182 Malignant neoplasm of ascending colon: Secondary | ICD-10-CM

## 2017-03-30 NOTE — Telephone Encounter (Signed)
Scheduled appt per 11/13 - patient did not want print - my chart active.

## 2017-03-30 NOTE — Progress Notes (Signed)
Terry Vargas OFFICE PROGRESS NOTE   Diagnosis: Colon cancer  INTERVAL HISTORY:   Terry Vargas Vargas returns as scheduled.  Terry Vargas feels well.  Terry Vargas is working and exercising.  Terry Vargas continues to have bowel movements, relieved with Imodium and Lomotil. Terry Vargas Vargas 03/25/2017.  Restaging CTs showed no evidence of recurrent colon cancer. Dr. Reynaldo Vargas recommends beginning surveillance colonoscopy.  Terry Vargas would like to have a colonoscopy in [place].  Objective:  Vital signs in last 24 hours:  Blood pressure (!) 146/90, temperature 98.1 F (36.7 C), temperature source Oral, resp. rate 16, height _0  (1.753 m), weight 157 lb 9.6 oz (71.5 kg), SpO2 100 %.    HEENT: Neck without mass Lymphatics: No cervical, supraclavicular, or axillary nodes.  "Shotty "bilateral inguinal nodes Resp: Lungs clear bilaterally Cardio: Regular rate and rhythm GI: No hepatosplenomegaly, no mass, nontender Vascular: No leg edema Skin: Mild acne type rash over the trunk    Lab Results: CEA at [facility] 8 2018-2 0.0   Medications: I have reviewed the patient's current medications.  Assessment/Plan: 1. Stage III (T4, N2) adenocarcinoma of the right colon, status post a right colectomy 11/25/2010. K-ras wild-type.  Terry Vargas began adjuvant FOLFOX chemotherapy 12/31/2010. Terry Vargas completed cycle 12 of FOLFOX on 06/03/2011.  #2-PET scan 12/29/2010 with hypermetabolic retroperitoneal and anterior abdominal adenopathy, postoperative changes of the right colectomy with a possible seroma in the anterior abdomen. Patchy activity was noted in the parotid glands, potentially inflammatory in etiology.  -The hypermetabolic retroperitoneal lymph nodes potentially represented metastatic disease. We presented his case at the GI tumor conference and discussed the case with Terry Vargas Vargas. A decision was made to proceed with "adjuvant" therapy with the plan for a restaging CT at a 3 to four-month interval.  -The restaging CT  on 03/23/2011 revealed a decrease in the periaortic lymphadenopathy, no new adenopathy, and no evidence of liver metastases. This suggests the small retroperitoneal lymph nodes may represent metastatic colon cancer versus resolving "inflammatory" nodes.  -Restaging CT 07/13/2011 revealed increased retroperitoneal lymphadenopathy consistent with metastatic disease.  -Restaging PET scan 07/22/2011 confirmed persistent hypermetabolic retroperitoneal lymph nodes. -Restaging CT 11/12/2011 with a slight increase in the size of retroperitoneal/mesenteric lymph nodes, no other evidence of progressive metastatic disease  -Restaging CT 01/27/2012 with a slight increase in the size of a necrotic celiac node, slight decrease in the para-aortic nodes  -Status post palliative radiation to the abdominal/retroperitoneal lymph nodes completed on 04/04/2012  -Restaging CT 05/31/2012 with a slight decrease in the size of abdominal lymph nodes and a stable mass adjacent to the SMA,? New left lung lesion  -Restaging CT 07/29/2012 with new mediastinal/hilar nodes and a new left lingula nodule. New retrocrural and retroperitoneal nodes.  -initiation of FOLFIRI/panitumumab on an every 2 week schedule 08/17/2012.  -restaging CT 10/19/2012 after 5 cycles of FOLFIRI/panitumumab with improvement in mediastinal adenopathy, a left lingular nodule, retroperitoneal nodes, and a stable node adjacent to the spleen.  -Cycle 10 of FOLFIRI/panitumumab on 12/21/2012  -Restaging CT 01/02/2013 with stable disease.  -PET scan 01/27/2013 showed progression of metastatic disease with increased number and size of mesenteric, retroperitoneal, mediastinal and bilateral hilar lymph nodes. No extranodal metastatic disease was identified.  - treated on protocol with Nivolumab at [place] CTs on 04/19/2013 confirmed a decreased left lung nodule, a decrease peripancreatic mass, and decreased retroperitoneal lymphadenopathy   -Continuation on every 2 week Nivolumab at [facility]  -Restaging CT 08/23/2013 with stable disease.  -Restaging CT 11/15/2013 with stable  disease.  -Restaging CT 02/07/2014 stable. -Restaging CT 05/02/2014 stable. -Restaging CT 10/17/2014-stable disease -Restaging CT 01/09/2015 stable. 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 A basal cell carcinoma was recently removed from a right finger by Dr. Wilhemina Bonito.  #6-Left leg deep vein thrombosis confirmed on a Doppler ultrasound 01/28/2011.  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 Terry Vargas has persistent mild numbness in the fingertips and toes. The numbness does not interfere with activity.  #8-Mild neutropenia on 03/11/2011, Terry Vargas received Neulasta following cycle #6 of FOLFOX. Terry Vargas also received Neulasta support following cycle 8, cycle 10 and cycle #12.  #9-history of Thrombocytopenia secondary to chemotherapy.  #10-Low back pain-most likely related to the necrotic celiac node. Terry Vargas completed palliative radiation on 04/04/2012. The pain resolved. Terry Vargas presented with progressive back pain likely secondary to progression of retrocrural/retroperitoneal adenopathy. The low back pain has resolved.   #11-Fever/rash on 07/29/2012-? contrast reaction.  #12-Clinical and CT evidence of a fractured Port-A-Cath 07/29/2012. The Port-A-Cath was removed on 08/04/2012. There was a linear laceration in the line. New Port-A-Cath placed on 08/15/2012.  #13-History of skin rash secondary to panitumumab.  #14-Abdominal pain beginning approximately 1 week after treatment occurring with cycles 7 and 8 FOLFIRI/Panitumumab. Status post celiac plexus block 01/12/2013 with no improvement in pain. Terry Vargas is status post a celiac block in interventional radiology on 02/14/2013. The pain has resolved. Terry Vargas is no longer taking pain medication.  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 Kissick remains in clinical remission from colon cancer.  We will refer him to Terry Vargas Vargas to resume surveillance endoscopy and colonoscopy.  Terry Vargas is scheduled to see Dr. Reynaldo Vargas for a restaging evaluation in 6 months.  Terry Vargas will return for an office visit here in 8 months.  15 minutes were spent with the patient today.  The majority of the time was used for counseling and coordination of care.  Terry Vargas Coder, MD  03/30/2017  3:28 PM

## 2017-04-02 ENCOUNTER — Encounter: Payer: Self-pay | Admitting: Gastroenterology

## 2017-05-24 ENCOUNTER — Other Ambulatory Visit: Payer: Self-pay

## 2017-05-24 ENCOUNTER — Ambulatory Visit (AMBULATORY_SURGERY_CENTER): Payer: Self-pay | Admitting: *Deleted

## 2017-05-24 VITALS — Ht 69.0 in | Wt 160.2 lb

## 2017-05-24 DIAGNOSIS — Z85038 Personal history of other malignant neoplasm of large intestine: Secondary | ICD-10-CM

## 2017-05-24 MED ORDER — PEG 3350-KCL-NA BICARB-NACL 420 G PO SOLR: 4000.0000 mL | Freq: Once | ORAL | 0 refills | Status: AC

## 2017-05-24 NOTE — Progress Notes (Signed)
No egg or soy allergy known to patient  No issues with past sedation with any surgeries  or procedures, no intubation problems  No diet pills per patient No home 02 use per patient  No blood thinners per patient  Pt denies issues with constipation  No A fib or A flutter  EMMI video sent to pt's e mail pt declined   

## 2017-05-27 ENCOUNTER — Encounter: Payer: Self-pay | Admitting: Gastroenterology

## 2017-06-02 ENCOUNTER — Other Ambulatory Visit: Payer: Self-pay

## 2017-06-02 ENCOUNTER — Encounter: Payer: Self-pay | Admitting: Gastroenterology

## 2017-06-02 ENCOUNTER — Ambulatory Visit (AMBULATORY_SURGERY_CENTER): Payer: BLUE CROSS/BLUE SHIELD | Admitting: Gastroenterology

## 2017-06-02 VITALS — BP 118/75 | HR 55 | Temp 98.4°F | Resp 15 | Ht 69.0 in | Wt 160.0 lb

## 2017-06-02 DIAGNOSIS — K649 Unspecified hemorrhoids: Secondary | ICD-10-CM

## 2017-06-02 DIAGNOSIS — Z85038 Personal history of other malignant neoplasm of large intestine: Secondary | ICD-10-CM

## 2017-06-02 DIAGNOSIS — Z1509 Genetic susceptibility to other malignant neoplasm: Secondary | ICD-10-CM | POA: Diagnosis not present

## 2017-06-02 MED ORDER — SODIUM CHLORIDE 0.9 % IV SOLN: 500.0000 mL | Freq: Once | INTRAVENOUS | Status: DC

## 2017-06-02 NOTE — Op Note (Signed)
[place] Patient Name: Terry Vargas Procedure Date: 06/02/2017 8:18 AM MRN: [id number] Endoscopist: Milus Banister , MD Age: 57 Referring MD:  Date of Birth: [birth year] Gender: Male Account #: [id number] Procedure:                Colonoscopy Indications:              High risk colon cancer surveillance: Personal                            history of colon cancer; metastatic, Lynch                            Syndrome, currently NAD off treatment for 2 years,                            followed at [place] (Dr. Reynaldo Minium) and [facility] (Dr.                            Benay Spice) Medicines:                Monitored Anesthesia Care Procedure:                Pre-Anesthesia Assessment:                           - Prior to the procedure, a History and Physical                            was performed, and patient medications and                            allergies were reviewed. The patient's tolerance of                            previous anesthesia was also reviewed. The risks                            and benefits of the procedure and the sedation                            options and risks were discussed with the patient.                            All questions were answered, and informed consent                            was obtained. Prior Anticoagulants: The patient has                            taken no previous anticoagulant or antiplatelet                            agents. ASA Grade Assessment: II - A patient with  mild systemic disease. After reviewing the risks                            and benefits, the patient was deemed in                            satisfactory condition to undergo the procedure.                           After obtaining informed consent, the colonoscope                            was passed under direct vision. Throughout the                            procedure, the patient's blood pressure, pulse, and              oxygen saturations were monitored continuously. The                            Colonoscope was introduced through the anus and                            advanced to the the ileocolonic anastomosis. The                            colonoscopy was performed without difficulty. The                            patient tolerated the procedure well. The quality                            of the bowel preparation was excellent. The rectum                            was photographed. Scope In: 8:25:10 AM Scope Out: 8:35:51 AM Scope Withdrawal Time: 0 hours 7 minutes 42 seconds  Total Procedure Duration: 0 hours 10 minutes 41 seconds  Findings:                 Normal appearing right sided ileo-colonic                            anastomosis.                           Internal hemorrhoids were found. The hemorrhoids                            were small.                           The exam was otherwise without abnormality on                            direct and retroflexion views.  No polyps or cancers. Complications:            No immediate complications. Estimated blood loss:                            None. Estimated Blood Loss:     Estimated blood loss: none. Impression:               - Normal appearing right sided ileo-colonic                            anastomosis                           - Internal hemorrhoids.                           - The examination was otherwise normal on direct                            and retroflexion views.                           - No polyps or cancers. Recommendation:           - Patient has a contact number available for                            emergencies. The signs and symptoms of potential                            delayed complications were discussed with the                            patient. Return to normal activities tomorrow.                            Written discharge instructions were provided to the                             patient.                           - Resume previous diet.                           - Continue present medications.                           - Repeat colonoscopy (and same day EGD) in 1 year                            for surveillance given known Lynch Syndrome,                            personal history of colon cancer. Milus Banister, MD 06/02/2017 8:41:11 AM This report has been signed electronically.

## 2017-06-02 NOTE — Progress Notes (Signed)
Report to PACU, RN, vss, BBS= Clear.  

## 2017-06-02 NOTE — Patient Instructions (Signed)
YOU HAD AN ENDOSCOPIC PROCEDURE TODAY AT [facility]:   Refer to the procedure report that was given to you for any specific questions about what was found during the examination.  If the procedure report does not answer your questions, please call your gastroenterologist to clarify.  If you requested that your care partner not be given the details of your procedure findings, then the procedure report has been included in a sealed envelope for you to review at your convenience later.  YOU SHOULD EXPECT: Some feelings of bloating in the abdomen. Passage of more gas than usual.  Walking can help get rid of the air that was put into your GI tract during the procedure and reduce the bloating. If you had a lower endoscopy (such as a colonoscopy or flexible sigmoidoscopy) you may notice spotting of blood in your stool or on the toilet paper. If you underwent a bowel prep for your procedure, you may not have a normal bowel movement for a few days.  Please Note:  You might notice some irritation and congestion in your nose or some drainage.  This is from the oxygen used during your procedure.  There is no need for concern and it should clear up in a day or so.  SYMPTOMS TO REPORT IMMEDIATELY:   Following lower endoscopy (colonoscopy or flexible sigmoidoscopy):  Excessive amounts of blood in the stool  Significant tenderness or worsening of abdominal pains  Swelling of the abdomen that is new, acute  Fever of 100F or higher   For urgent or emergent issues, a gastroenterologist can be reached at any hour by calling [phone].   DIET:  We do recommend a small meal at first, but then you may proceed to your regular diet.  Drink plenty of fluids but you should avoid alcoholic beverages for 24 hours.  MEDICATIONS: Continue present medications.  Please see handouts given to you by your recovery nurse.  ACTIVITY:  You should plan to take it easy for the rest of today and you should  NOT DRIVE or use heavy machinery until tomorrow (because of the sedation medicines used during the test).    FOLLOW UP: Our staff will call the number listed on your records the next business day following your procedure to check on you and address any questions or concerns that you may have regarding the information given to you following your procedure. If we do not reach you, we will leave a message.  However, if you are feeling well and you are not experiencing any problems, there is no need to return our call.  We will assume that you have returned to your regular daily activities without incident.  If any biopsies were taken you will be contacted by phone or by letter within the next 1-3 weeks.  Please call us at [phone] if you have not heard about the biopsies in 3 weeks.   Thank you for allowing [place] to provide for your healthcare needs today.,  SIGNATURES/CONFIDENTIALITY: You and/or your care partner have signed paperwork which will be entered into your electronic medical record.  These signatures attest to the fact that that the information above on your After Visit Summary has been reviewed and is understood.  Full responsibility of the confidentiality of this discharge information lies with you and/or your care-partner.

## 2017-06-02 NOTE — Progress Notes (Signed)
Pt's states no medical or surgical changes since previsit or office visit. 

## 2017-06-03 ENCOUNTER — Telehealth: Payer: Self-pay

## 2017-06-03 NOTE — Telephone Encounter (Signed)
  Follow up Call-  Call back number 06/02/2017  Post procedure Call Back phone  # [phone]  Permission to leave phone message Yes  Some recent data might be hidden     Patient questions:  Do you have a fever, pain , or abdominal swelling? No. Pain Score  0 *  Have you tolerated food without any problems? Yes.    Have you been able to return to your normal activities? Yes.    Do you have any questions about your discharge instructions: Diet   No. Medications  No. Follow up visit  No.  Do you have questions or concerns about your Care? No.  Actions: * If pain score is 4 or above: No action needed, pain <4.

## 2017-11-26 ENCOUNTER — Inpatient Hospital Stay: Payer: BLUE CROSS/BLUE SHIELD | Attending: Oncology | Admitting: Oncology

## 2017-11-26 ENCOUNTER — Telehealth: Payer: Self-pay | Admitting: Oncology

## 2017-11-26 VITALS — BP 121/81 | HR 49 | Temp 98.6°F | Resp 17 | Ht 69.0 in | Wt 156.8 lb

## 2017-11-26 DIAGNOSIS — Z923 Personal history of irradiation: Secondary | ICD-10-CM | POA: Diagnosis not present

## 2017-11-26 DIAGNOSIS — Z85038 Personal history of other malignant neoplasm of large intestine: Secondary | ICD-10-CM | POA: Diagnosis not present

## 2017-11-26 DIAGNOSIS — Z86718 Personal history of other venous thrombosis and embolism: Secondary | ICD-10-CM | POA: Diagnosis not present

## 2017-11-26 DIAGNOSIS — C182 Malignant neoplasm of ascending colon: Secondary | ICD-10-CM

## 2017-11-26 DIAGNOSIS — C7652 Malignant neoplasm of left lower limb: Secondary | ICD-10-CM | POA: Diagnosis present

## 2017-11-26 DIAGNOSIS — Z85828 Personal history of other malignant neoplasm of skin: Secondary | ICD-10-CM

## 2017-11-26 DIAGNOSIS — R197 Diarrhea, unspecified: Secondary | ICD-10-CM | POA: Insufficient documentation

## 2017-11-26 DIAGNOSIS — Z9221 Personal history of antineoplastic chemotherapy: Secondary | ICD-10-CM

## 2017-11-26 NOTE — Telephone Encounter (Signed)
Appointment scheduled AVS printed / pt declined Calendar due to My Chart per 7/12 los

## 2017-11-26 NOTE — Progress Notes (Signed)
IP PROGRESS NOTE  Subjective:   Terry Vargas returns as scheduled.  He feels well.  He is exercising.  He has diarrhea with certain foods.  He saw Terry Vargas in May.  A restaging CT showed no evidence of disease progression.  He developed an erythematous lesion at the posterior left thigh in May.  He saw Terry Vargas when the lesion enlarged.  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  The lesion has rapidly enlarged over the past month.  He is scheduled for surgical resection next week.  Objective: Vital signs in last 24 hours: Blood pressure 121/81, pulse (!) 49, temperature 98.6 F (37 C), temperature source Oral, resp. rate 17, height _0  (1.753 m), weight 156 lb 12.8 oz (71.1 kg), SpO2 100 %.  Intake/Output from previous day: No intake/output data recorded.  Physical Exam:  HEENT: Neck without mass Lungs: Clear bilaterally, no respiratory distress Cardiac: Regular rate and rhythm Abdomen: No hepatospleno megaly, no mass, nontender Extremities: No leg edema Lymph nodes: No cervical, supraclavicular, or axillary nodes.  "Shotty "bilateral inguinal nodes Skin: There is an approximate 2.5 cm raised ulcerated mass at the mid posterior left thigh   Medications: I have reviewed the patient's current medications.  Assessment/Plan: 1. Stage III (T4, N2) adenocarcinoma of the right colon, status post a right colectomy 11/25/2010. K-ras wild-type.  He began adjuvant FOLFOX chemotherapy 12/31/2010. He completed cycle 12 of FOLFOX on 06/03/2011.  #2-PET scan 12/29/2010 with hypermetabolic retroperitoneal and anterior abdominal adenopathy, postoperative changes of the right colectomy with a possible seroma in the anterior abdomen. Patchy activity was noted in the parotid glands, potentially inflammatory in etiology.  -The hypermetabolic retroperitoneal lymph nodes potentially represented metastatic disease. We presented his case at the GI tumor conference and discussed the  case with Terry Vargas. A decision was made to proceed with "adjuvant" therapy with the plan for a restaging CT at a 3 to four-month interval.  -The restaging CT on 03/23/2011 revealed a decrease in the periaortic lymphadenopathy, no new adenopathy, and no evidence of liver metastases. This suggests the small retroperitoneal lymph nodes may represent metastatic colon cancer versus resolving "inflammatory" nodes.  -Restaging CT 07/13/2011 revealed increased retroperitoneal lymphadenopathy consistent with metastatic disease.  -Restaging PET scan 07/22/2011 confirmed persistent hypermetabolic retroperitoneal lymph nodes. -Restaging CT 11/12/2011 with a slight increase in the size of retroperitoneal/mesenteric lymph nodes, no other evidence of progressive metastatic disease  -Restaging CT 01/27/2012 with a slight increase in the size of a necrotic celiac node, slight decrease in the para-aortic nodes  -Status post palliative radiation to the abdominal/retroperitoneal lymph nodes completed on 04/04/2012  -Restaging CT 05/31/2012 with a slight decrease in the size of abdominal lymph nodes and a stable mass adjacent to the SMA,? New left lung lesion  -Restaging CT 07/29/2012 with new mediastinal/hilar nodes and a new left lingula nodule. New retrocrural and retroperitoneal nodes.  -initiation of FOLFIRI/panitumumab on an every 2 week schedule 08/17/2012.  -restaging CT 10/19/2012 after 5 cycles of FOLFIRI/panitumumab with improvement in mediastinal adenopathy, a left lingular nodule, retroperitoneal nodes, and a stable node adjacent to the spleen.  -Cycle 10 of FOLFIRI/panitumumab on 12/21/2012  -Restaging CT 01/02/2013 with stable disease.  -PET scan 01/27/2013 showed progression of metastatic disease with increased number and size of mesenteric, retroperitoneal, mediastinal and bilateral hilar lymph nodes. No extranodal metastatic disease was identified.  - treated on protocol with Nivolumab at  [facility] on 04/19/2013 confirmed a decreased left lung nodule, a  decrease peripancreatic mass, and decreased retroperitoneal lymphadenopathy  -Continuation on every 2 week Nivolumab at [facility]  -Restaging CT 08/23/2013 with stable disease.  -Restaging CT 11/15/2013 with stable disease.  -Restaging CT 02/07/2014 stable. -Restaging CT 05/02/2014 stable. -Restaging CT 10/17/2014-stable disease -Restaging CT 01/09/2015 stable. -Restaging CTs 04/03/2015-stable disease, new nodular/groundglass opacities in the lower lungs bilaterally concerning for infection -Nivolumab discontinued after treatment 04/17/2015 secondary to a duodenal ulcer/stricture -Restaging CTs 09/05/2015-no evidence of disease progression -Restaging CT 12/05/2015-no evidence of disease progression  -Restaging CTs 03/04/2016-no evidence of disease progression -Restaging CTs 05/27/2016-no evidence of disease progression -Restaging CTs 03/25/2017-no evidence of disease progression -Restaging CTs 09/30/2017-no evidence of metastatic disease, stable soft tissue at the celiac artery and superior mesenteric artery 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 A basal cell carcinoma was recently removed from a right finger by Dr. Wilhemina Bonito.  #6-Left leg deep vein thrombosis confirmed on a Doppler ultrasound 01/28/2011.  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 He has persistent mild numbness in the fingertips and toes. The numbness does not interfere with activity.  #8-Mild neutropenia on 03/11/2011, he received Neulasta following cycle #6 of FOLFOX. He also received Neulasta support following cycle 8, cycle 10 and cycle #12.  #9-history of Thrombocytopenia secondary to chemotherapy.   #10-Low back pain-most likely related to the necrotic celiac node. He completed palliative radiation on 04/04/2012. The pain resolved. He presented with progressive back pain likely secondary to progression of retrocrural/retroperitoneal adenopathy. The low back pain has resolved.  #11-Fever/rash on 07/29/2012-? contrast reaction.  #12-Clinical and CT evidence of a fractured Port-A-Cath 07/29/2012. The Port-A-Cath was removed on 08/04/2012. There was a linear laceration in the line. New Port-A-Cath placed on 08/15/2012.  #13-History of skin rash secondary to panitumumab.  #14-Abdominal pain beginning approximately 1 week after treatment occurring with cycles 7 and 8 FOLFIRI/Panitumumab. Status post celiac plexus block 01/12/2013 with no improvement in pain. He is status post a celiac block in interventional radiology on 02/14/2013. The pain has resolved. He is no longer taking pain medication.  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  He is scheduled for surveillance CTs with Terry Vargas in November.  He is followed by Dr. Ardis Hughs for surveillance endoscopies.  Last colonoscopy in January 2019, scheduled for endoscopy/colonoscopy at a one-year interval.  He has been diagnosed with a squamous cell carcinoma of the left thigh.  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  He is scheduled for surgical excision next week.  The squamous cell carcinoma may be related to the [place].  We will follow-up on the pathology report from the resection procedure.  He will return for an office  visit here after the appointment with Terry Vargas in November.  15 minutes were spent with the patient today.  The majority of the time was used for counseling and coordination of care.   Betsy Coder, MD   11/26/2017, 4:24 PM

## 2018-03-08 ENCOUNTER — Encounter: Payer: Self-pay | Admitting: Oncology

## 2018-03-14 ENCOUNTER — Inpatient Hospital Stay: Payer: BLUE CROSS/BLUE SHIELD | Attending: Oncology | Admitting: Oncology

## 2018-03-14 ENCOUNTER — Telehealth: Payer: Self-pay | Admitting: *Deleted

## 2018-03-14 VITALS — BP 144/80 | HR 50 | Temp 98.7°F | Resp 18 | Ht 69.0 in | Wt 161.3 lb

## 2018-03-14 DIAGNOSIS — Z86718 Personal history of other venous thrombosis and embolism: Secondary | ICD-10-CM | POA: Insufficient documentation

## 2018-03-14 DIAGNOSIS — Z9221 Personal history of antineoplastic chemotherapy: Secondary | ICD-10-CM | POA: Insufficient documentation

## 2018-03-14 DIAGNOSIS — C182 Malignant neoplasm of ascending colon: Secondary | ICD-10-CM | POA: Insufficient documentation

## 2018-03-14 NOTE — Telephone Encounter (Signed)
Per Dr. Benay Spice, see patient today at 3:30 pm. Patient notified and agrees to come in. LOS to scheduler.

## 2018-03-14 NOTE — Progress Notes (Signed)
[place] OFFICE PROGRESS NOTE   Diagnosis: Colon cancer  INTERVAL HISTORY:   Terry Vargas returns prior to his scheduled visit.  He reports swelling in the right lower leg beginning on 01/23/2018.  He participated in a cycling race on the previous day.  The swelling has persisted.  He has mild "tightness "in the right calf with exercise.  No other complaint. 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 He has taken Xarelto, 20 mg daily, since 03/12/2018. Objective:  Vital signs in last 24 hours:  Blood pressure (!) 136/91, pulse (!) 50, temperature 98.7 F (37.1 C), temperature source Oral, resp. rate 18, height _0  (1.753 m), weight 161 lb 4.8 oz (73.2 kg), SpO2 98 %.    HEENT: Neck without mass Lymphatics: No cervical, supraclavicular, axillary, or inguinal nodes Resp: Lungs clear bilaterally Cardio: Regular rate and rhythm GI: No mass, nontender, no hepatosplenomegaly Vascular: There is edema throughout the right leg below the knee, no tenderness, no palpable cord, no erythema  Skin: Left posterior thigh scar is healed and without evidence of recurrent tumor   Medications: I have reviewed the patient's current medications.   Assessment/Plan: 1. Stage III (T4, N2) adenocarcinoma of the right colon, status post a right colectomy 11/25/2010. K-ras wild-type.  He began adjuvant FOLFOX chemotherapy 12/31/2010. He completed cycle 12 of FOLFOX on 06/03/2011.  #2-PET scan 12/29/2010 with hypermetabolic retroperitoneal and anterior abdominal adenopathy, postoperative changes of the right colectomy with a possible seroma in the anterior abdomen. Patchy activity was noted in the parotid glands, potentially inflammatory in etiology.  -The hypermetabolic retroperitoneal lymph nodes potentially represented metastatic disease. We presented his case at the GI tumor conference and discussed the case with Dr. Donne Hazel. A decision was made  to proceed with "adjuvant" therapy with the plan for a restaging CT at a 3 to four-month interval.  -The restaging CT on 03/23/2011 revealed a decrease in the periaortic lymphadenopathy, no new adenopathy, and no evidence of liver metastases. This suggests the small retroperitoneal lymph nodes may represent metastatic colon cancer versus resolving "inflammatory" nodes.  -Restaging CT 07/13/2011 revealed increased retroperitoneal lymphadenopathy consistent with metastatic disease.  -Restaging PET scan 07/22/2011 confirmed persistent hypermetabolic retroperitoneal lymph nodes. -Restaging CT 11/12/2011 with a slight increase in the size of retroperitoneal/mesenteric lymph nodes, no other evidence of progressive metastatic disease  -Restaging CT 01/27/2012 with a slight increase in the size of a necrotic celiac node, slight decrease in the para-aortic nodes  -Status post palliative radiation to the abdominal/retroperitoneal lymph nodes completed on 04/04/2012  -Restaging CT 05/31/2012 with a slight decrease in the size of abdominal lymph nodes and a stable mass adjacent to the SMA,? New left lung lesion  -Restaging CT 07/29/2012 with new mediastinal/hilar nodes and a new left lingula nodule. New retrocrural and retroperitoneal nodes.  -initiation of FOLFIRI/panitumumab on an every 2 week schedule 08/17/2012.  -restaging CT 10/19/2012 after 5 cycles of FOLFIRI/panitumumab with improvement in mediastinal adenopathy, a left lingular nodule, retroperitoneal nodes, and a stable node adjacent to the spleen.  -Cycle 10 of FOLFIRI/panitumumab on 12/21/2012  -Restaging CT 01/02/2013 with stable disease.  -PET scan 01/27/2013 showed progression of metastatic disease with increased number and size of mesenteric, retroperitoneal, mediastinal and bilateral hilar lymph nodes. No extranodal metastatic disease was identified.  - treated on protocol with Nivolumab at [place] CTs on 04/19/2013  confirmed a decreased left lung nodule, a decrease peripancreatic mass, and decreased retroperitoneal lymphadenopathy  -  Continuation on every 2 week Nivolumab at [place] CT 08/23/2013 with stable disease.  -Restaging CT 11/15/2013 with stable disease.  -Restaging CT 02/07/2014 stable. -Restaging CT 05/02/2014 stable. -Restaging CT 10/17/2014-stable disease -Restaging CT 01/09/2015 stable. -Restaging CTs 04/03/2015-stable disease, new nodular/groundglass opacities in the lower lungs bilaterally concerning for infection -Nivolumab discontinued after treatment 04/17/2015 secondary to a duodenal ulcer/stricture -Restaging CTs 09/05/2015-no evidence of disease progression -Restaging CT 12/05/2015-no evidence of disease progression  -Restaging CTs 03/04/2016-no evidence of disease progression -Restaging CTs 05/27/2016-no evidence of disease progression -Restaging CTs 03/25/2017-no evidence of disease progression -Restaging CTs 09/30/2017-no evidence of metastatic disease, stable soft tissue at the celiac artery and superior mesenteric artery 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 A basal cell carcinoma was recently removed from a right finger by Dr. Wilhemina Bonito.  #6-Left leg deep vein thrombosis confirmed on a Doppler ultrasound 01/28/2011.  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 He has persistent mild numbness in the fingertips and toes. The numbness does not interfere with activity.  #8-Mild neutropenia on 03/11/2011, he received Neulasta following cycle #6 of FOLFOX. He also received Neulasta support following cycle 8, cycle 10 and cycle #12.  #9-history of Thrombocytopenia secondary to chemotherapy.  #10-Low back pain-most likely related  to the necrotic celiac node. He completed palliative radiation on 04/04/2012. The pain resolved. He presented with progressive back pain likely secondary to progression of retrocrural/retroperitoneal adenopathy. The low back pain has resolved.  #11-Fever/rash on 07/29/2012-? contrast reaction.  #12-Clinical and CT evidence of a fractured Port-A-Cath 07/29/2012. The Port-A-Cath was removed on 08/04/2012. There was a linear laceration in the line. New Port-A-Cath placed on 08/15/2012.  #13-History of skin rash secondary to panitumumab.  #14-Abdominal pain beginning approximately 1 week after treatment occurring with cycles 7 and 8 FOLFIRI/Panitumumab. Status post celiac plexus block 01/12/2013 with no improvement in pain. He is status post a celiac block in interventional radiology on 02/14/2013. The pain has resolved. He is no longer taking pain medication.  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  He has swelling in the right lower leg.  He has a remote history of a left leg DVT.  I am concerned he has developed a right leg DVT.  He will continue Xarelto and return for a Doppler of both lower extremities on 03/15/2018.  He will complete a Xarelto starter pack if the Doppler confirms a deep vein thrombosis.  He will return for an office visit as scheduled 04/08/2018.  He will see Dr. Reynaldo Minium for restaging CTs 03/31/2018.  25 minutes were spent with the patient today.  The majority of the time was used for counseling and coordination of care.  Betsy Coder, MD  03/14/2018  4:24 PM

## 2018-03-14 NOTE — Patient Instructions (Signed)
Report to [facility] on 03/15/18 at [place] for doppler of right leg.

## 2018-03-14 NOTE — Telephone Encounter (Signed)
Called patient regarding MyChart message 03/08/18 with RLL swelling. He reports it as a sudden onset approximately 3-4 weeks ago with swelling from calf down in RLL. Reports the swelling is less early in am and increases during day. No pain or discoloration. Still able to carry out normal activities and cycle as usual. Inquires if MD needs to see him about this or can it wait till his CT scan at [facility] on 03/31/18?

## 2018-03-15 ENCOUNTER — Ambulatory Visit (HOSPITAL_COMMUNITY)
Admission: RE | Admit: 2018-03-15 | Discharge: 2018-03-15 | Disposition: A | Discharge status: Home / Self Care | Payer: BLUE CROSS/BLUE SHIELD | Source: Ambulatory Visit | Attending: Oncology | Admitting: Oncology

## 2018-03-15 ENCOUNTER — Telehealth: Payer: Self-pay | Admitting: *Deleted

## 2018-03-15 ENCOUNTER — Other Ambulatory Visit: Payer: Self-pay | Admitting: *Deleted

## 2018-03-15 DIAGNOSIS — C182 Malignant neoplasm of ascending colon: Secondary | ICD-10-CM | POA: Diagnosis present

## 2018-03-15 DIAGNOSIS — I82441 Acute embolism and thrombosis of right tibial vein: Secondary | ICD-10-CM | POA: Insufficient documentation

## 2018-03-15 DIAGNOSIS — I82451 Acute embolism and thrombosis of right peroneal vein: Secondary | ICD-10-CM | POA: Diagnosis not present

## 2018-03-15 DIAGNOSIS — I82431 Acute embolism and thrombosis of right popliteal vein: Secondary | ICD-10-CM | POA: Diagnosis not present

## 2018-03-15 MED ORDER — RIVAROXABAN (XARELTO) VTE STARTER PACK (15 & 20 MG): ORAL_TABLET | ORAL | 0 refills | Status: DC

## 2018-03-15 MED FILL — XARELTO STARTER PACK: 15 & 20 | 28 days supply | Qty: 51 | Fill #0

## 2018-03-15 NOTE — Telephone Encounter (Signed)
TC from [place] in [place] lab with results of pt's bilateral leg u/s. Pt is + for DVT in right perineal posterior tibial and popliteal veins.   Spoke with Dr. Benay Spice regarding results. Per Dr. Benay Spice, pt to come to cancer center to speak with Dr, Benay Spice re: medication management. Pt made aware and arrived within a few minutes, taken back to sub-wait in Dr. Gearldine Shown area.

## 2018-03-15 NOTE — Progress Notes (Signed)
Bilateral lower extremity venous duplex completed. Preliminary results. Right - Positive for an acute DVT of the peroneall and posterior tibial veins mid calf to the confluence with the popliteal vein and coursing through the entire popliteal vein. Left - There is no evidence of a DVT. Bilateral - There is no evidence of a Baker's cyst. Toma Copier, RVS 03/15/2018 9:32 AM

## 2018-04-08 ENCOUNTER — Inpatient Hospital Stay: Payer: BLUE CROSS/BLUE SHIELD | Attending: Oncology | Admitting: Oncology

## 2018-04-08 VITALS — BP 138/76 | HR 53 | Temp 98.1°F | Resp 15 | Ht 69.0 in | Wt 158.3 lb

## 2018-04-08 DIAGNOSIS — G620 Drug-induced polyneuropathy: Secondary | ICD-10-CM | POA: Diagnosis not present

## 2018-04-08 DIAGNOSIS — Z85828 Personal history of other malignant neoplasm of skin: Secondary | ICD-10-CM

## 2018-04-08 DIAGNOSIS — Z7901 Long term (current) use of anticoagulants: Secondary | ICD-10-CM | POA: Diagnosis not present

## 2018-04-08 DIAGNOSIS — Z86718 Personal history of other venous thrombosis and embolism: Secondary | ICD-10-CM | POA: Diagnosis not present

## 2018-04-08 DIAGNOSIS — Z923 Personal history of irradiation: Secondary | ICD-10-CM | POA: Diagnosis not present

## 2018-04-08 DIAGNOSIS — Z9221 Personal history of antineoplastic chemotherapy: Secondary | ICD-10-CM

## 2018-04-08 DIAGNOSIS — Z85038 Personal history of other malignant neoplasm of large intestine: Secondary | ICD-10-CM

## 2018-04-08 DIAGNOSIS — C182 Malignant neoplasm of ascending colon: Secondary | ICD-10-CM

## 2018-04-08 MED ORDER — RIVAROXABAN 20 MG PO TABS: 20.0000 mg | ORAL_TABLET | Freq: Every day | ORAL | 1 refills | Status: DC

## 2018-04-08 NOTE — Progress Notes (Signed)
[place] OFFICE PROGRESS NOTE   Diagnosis: Colon cancer  INTERVAL HISTORY:   Mr. Bischoff is maintained on Xarelto anticoagulation.  The right leg discomfort has resolved.  The leg is no longer "tight".  He is exercising.  No bleeding.  No complaint. He saw Dr. Reynaldo Minium 03/31/2018.  Restaging CTs revealed no evidence of recurrent colon cancer.  There is stable soft tissue at the celiac artery and SMA.  Objective:  Vital signs in last 24 hours:  Blood pressure (!) 148/94, pulse (!) 53, temperature 98.1 F (36.7 C), temperature source Oral, resp. rate 15, height _0  (1.753 m), weight 158 lb 4.8 oz (71.8 kg), SpO2 100 %.    HEENT: Neck without mass Lymphatics: No cervical or supraclavicular nodes Resp: Lungs clear bilaterally Cardio: Regular rate and rhythm GI: No hepatosplenomegaly, no mass, nontender Vascular: Trace edema at the right lower leg, no tenderness Skin: Left posterior thigh scar without evidence of recurrent tumor    Lab Results:  CEA at [facility] on 03/31/2018: 1.9 Medications: I have reviewed the patient's current medications.   Assessment/Plan: 1. Stage III (T4, N2) adenocarcinoma of the right colon, status post a right colectomy 11/25/2010. K-ras wild-type.  He began adjuvant FOLFOX chemotherapy 12/31/2010. He completed cycle 12 of FOLFOX on 06/03/2011.  #2-PET scan 12/29/2010 with hypermetabolic retroperitoneal and anterior abdominal adenopathy, postoperative changes of the right colectomy with a possible seroma in the anterior abdomen. Patchy activity was noted in the parotid glands, potentially inflammatory in etiology.  -The hypermetabolic retroperitoneal lymph nodes potentially represented metastatic disease. We presented his case at the GI tumor conference and discussed the case with Dr. Donne Hazel. A decision was made to proceed with "adjuvant" therapy with the plan for a restaging CT at a 3 to four-month interval.  -The restaging CT on  03/23/2011 revealed a decrease in the periaortic lymphadenopathy, no new adenopathy, and no evidence of liver metastases. This suggests the small retroperitoneal lymph nodes may represent metastatic colon cancer versus resolving "inflammatory" nodes.  -Restaging CT 07/13/2011 revealed increased retroperitoneal lymphadenopathy consistent with metastatic disease.  -Restaging PET scan 07/22/2011 confirmed persistent hypermetabolic retroperitoneal lymph nodes. -Restaging CT 11/12/2011 with a slight increase in the size of retroperitoneal/mesenteric lymph nodes, no other evidence of progressive metastatic disease  -Restaging CT 01/27/2012 with a slight increase in the size of a necrotic celiac node, slight decrease in the para-aortic nodes  -Status post palliative radiation to the abdominal/retroperitoneal lymph nodes completed on 04/04/2012  -Restaging CT 05/31/2012 with a slight decrease in the size of abdominal lymph nodes and a stable mass adjacent to the SMA,? New left lung lesion  -Restaging CT 07/29/2012 with new mediastinal/hilar nodes and a new left lingula nodule. New retrocrural and retroperitoneal nodes.  -initiation of FOLFIRI/panitumumab on an every 2 week schedule 08/17/2012.  -restaging CT 10/19/2012 after 5 cycles of FOLFIRI/panitumumab with improvement in mediastinal adenopathy, a left lingular nodule, retroperitoneal nodes, and a stable node adjacent to the spleen.  -Cycle 10 of FOLFIRI/panitumumab on 12/21/2012  -Restaging CT 01/02/2013 with stable disease.  -PET scan 01/27/2013 showed progression of metastatic disease with increased number and size of mesenteric, retroperitoneal, mediastinal and bilateral hilar lymph nodes. No extranodal metastatic disease was identified.  - treated on protocol with Nivolumab at [place] CTs on 04/19/2013 confirmed a decreased left lung nodule, a decrease peripancreatic mass, and decreased retroperitoneal lymphadenopathy   -Continuation on every 2 week Nivolumab at [facility]  -Restaging CT 08/23/2013 with stable disease.  -Restaging  CT 11/15/2013 with stable disease.  -Restaging CT 02/07/2014 stable. -Restaging CT 05/02/2014 stable. -Restaging CT 10/17/2014-stable disease -Restaging CT 01/09/2015 stable. -Restaging CTs 04/03/2015-stable disease, new nodular/groundglass opacities in the lower lungs bilaterally concerning for infection -Nivolumab discontinued after treatment 04/17/2015 secondary to a duodenal ulcer/stricture -Restaging CTs 09/05/2015-no evidence of disease progression -Restaging CT 12/05/2015-no evidence of disease progression  -Restaging CTs 03/04/2016-no evidence of disease progression -Restaging CTs 05/27/2016-no evidence of disease progression -Restaging CTs 03/25/2017-no evidence of disease progression -Restaging CTs 09/30/2017-no evidence of metastatic disease, stable soft tissue at the celiac artery and superior mesenteric artery -Restaging CTs 03/31/2018-no evidence of recurrent disease, stable soft tissue at the celiac artery and SMA 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 A basal cell carcinoma was recently removed from a right finger by Dr. Wilhemina Bonito.  #6-Left leg deep vein thrombosis confirmed on a Doppler ultrasound 01/28/2011.  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 He has persistent mild numbness in the fingertips and toes. The numbness does not interfere with activity.  #8-Mild neutropenia on 03/11/2011, he received Neulasta following cycle #6 of FOLFOX. He also received Neulasta support following cycle 8, cycle 10 and cycle #12.  #9-history of Thrombocytopenia secondary to chemotherapy.  #10-Low back pain-most likely related to the  necrotic celiac node. He completed palliative radiation on 04/04/2012. The pain resolved. He presented with progressive back pain likely secondary to progression of retrocrural/retroperitoneal adenopathy. The low back pain has resolved.  #11-Fever/rash on 07/29/2012-? contrast reaction.  #12-Clinical and CT evidence of a fractured Port-A-Cath 07/29/2012. The Port-A-Cath was removed on 08/04/2012. There was a linear laceration in the line. New Port-A-Cath placed on 08/15/2012.  #13-History of skin rash secondary to panitumumab.  #14-Abdominal pain beginning approximately 1 week after treatment occurring with cycles 7 and 8 FOLFIRI/Panitumumab. Status post celiac plexus block 01/12/2013 with no improvement in pain. He is status post a celiac block in interventional radiology on 02/14/2013. The pain has resolved. He is no longer taking pain medication.  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 Pillsbury remains in remission from colon cancer.  He was diagnosed with a right leg DVT last month.  The leg symptoms have resolved since beginning Xarelto anticoagulation.  He will continue indefinite anticoagulation.  He reports his brother was diagnosed with a factor V Leiden mutation.  We decided against checking this as it will not alter his treatment plan.  He will consider having the factor V Leiden mutation checked when he next sees Dr. Reynaldo Minium.  Mr. Dilorenzo will continue follow-up with Dr. Ardis Hughs for surveillance upper and lower endoscopy.   He will return for an office visit in 6 months.  Betsy Coder, MD  04/08/2018  8:36 AM

## 2018-06-03 ENCOUNTER — Ambulatory Visit (INDEPENDENT_AMBULATORY_CARE_PROVIDER_SITE_OTHER): Payer: BLUE CROSS/BLUE SHIELD | Admitting: Physician Assistant

## 2018-06-03 ENCOUNTER — Encounter: Payer: Self-pay | Admitting: Physician Assistant

## 2018-06-03 ENCOUNTER — Telehealth: Payer: Self-pay

## 2018-06-03 VITALS — BP 136/84 | HR 72 | Ht 69.0 in | Wt 161.4 lb

## 2018-06-03 DIAGNOSIS — Z1509 Genetic susceptibility to other malignant neoplasm: Secondary | ICD-10-CM

## 2018-06-03 DIAGNOSIS — Z01818 Encounter for other preprocedural examination: Secondary | ICD-10-CM

## 2018-06-03 DIAGNOSIS — Z85038 Personal history of other malignant neoplasm of large intestine: Secondary | ICD-10-CM

## 2018-06-03 DIAGNOSIS — Z7901 Long term (current) use of anticoagulants: Secondary | ICD-10-CM

## 2018-06-03 DIAGNOSIS — I82401 Acute embolism and thrombosis of unspecified deep veins of right lower extremity: Secondary | ICD-10-CM

## 2018-06-03 NOTE — Progress Notes (Signed)
I agree with the above note, plan 

## 2018-06-03 NOTE — Patient Instructions (Addendum)
If you are age 58 or older, your body mass index should be between 23-30. Your Body mass index is 23.83 kg/m. If this is out of the aforementioned range listed, please consider follow up with your Primary Care Provider.  If you are age 58 or younger, your body mass index should be between 19-25. Your Body mass index is 23.83 kg/m. If this is out of the aformentioned range listed, please consider follow up with your Primary Care Provider.   We will contact Dr Benay Spice to discuss holding your Xarelto. Once we have clearance we can set up a colon/EGD with a nurse visit for instructions.   It was a pleasure to see you today!  J.lemmon, PA-C

## 2018-06-03 NOTE — Progress Notes (Signed)
Chief Complaint: History of Lynch syndrome, Personal history of colon cancer  HPI:    [name] is a 58 year old Caucasian male with a past medical history as listed below including recent diagnosis of DVT in the right leg, started on Xarelto 1 month ago, known to Dr. Ardis Hughs for his history of Lynch syndrome, who presents to clinic today for a preprocedural visit for colonoscopy and EGD.    06/02/2017 colonoscopy with Dr. Ardis Hughs with a normal-appearing right sided ileocolonic anastomosis, internal hemorrhoids and otherwise normal exam with no polyps or cancers.  Repeat colonoscopy and same-day EGD was recommended in 1 year for surveillance given known Lynch syndrome and personal history of colon cancer.    Today, patient tells me he has been doing well, the only new change in his health is a diagnosis of a DVT in his right leg now for which he has been started on Xarelto over the past month by Dr. Benay Spice.  He continues with chronic diarrhea but this is ever since his hemicolectomy, he manages this with Imodium 3 times a day.    Denies fever, chills, weight loss, nausea, vomiting, heartburn, reflux, change in bowel habits or blood in his stool.  Past Medical History:  Diagnosis Date  . Abdominal mass, RUQ (right upper quadrant)   . Colon cancer ([place]) 12/02/2010   invasive adenocarcinoma, non-polyposis  . DVT (deep venous thrombosis) ([place]) 01/28/11   left leg  . Heart murmur   . History of chemotherapy    LAST CHEMO 12-23-2012  . History of radiation therapy 03/14/12 - 04/04/12   retroperitoneal/abdominal region  . Hyperlipidemia    no longer have  . Hypertension    no longer have  . Skin cancer    squamous cell-L ear, basal cell- face/chest    Past Surgical History:  Procedure Laterality Date  .  pot-a-cath insertion    . COLON SURGERY    . COLONOSCOPY    . EUS N/A 01/12/2013   Procedure: UPPER ENDOSCOPIC ULTRASOUND (EUS) LINEAR;  Surgeon: Milus Banister, MD;  Location: [facility]  ENDOSCOPY;  Service: Endoscopy;  Laterality: N/A;  celiac plexus   . gastrojejunostomy    . 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 open right colectomy  11/25/10   hemicolectomy w/appendectomy  . POLYPECTOMY    . PORT-A-CATH REMOVAL Left 08/04/2012   Procedure: REMOVAL PORT-A-CATH;  Surgeon: Rolm Bookbinder, MD;  Location: [facility];  Service: General;  Laterality: Left;  . PORTACATH PLACEMENT Left 08/15/2012   Procedure: INSERTION PORT-A-CATH;  Surgeon: Rolm Bookbinder, MD;  Location: [facility];  Service: General;  Laterality: Left;  . SKIN CANCER EXCISION    . TONSILLECTOMY     as child    Current Outpatient Medications  Medication Sig Dispense Refill  . acetaminophen (TYLENOL) 500 MG tablet Take 1,000 mg by mouth every 6 (six) hours as needed for pain.     . cholecalciferol (VITAMIN D) 1000 UNITS tablet Take 1,000 Units by mouth daily.    . diclofenac sodium (VOLTAREN) 1 % GEL Apply 2 g topically 4 (four) times daily. 300 g 3  . diphenhydrAMINE (BENADRYL) 25 MG tablet Take 25 mg by mouth daily.    Marland Kitchen KRILL OIL OMEGA-3 PO Take 300 mg by mouth daily. Pt uses Mega Red Omega 3 Krill Oil    . lidocaine-prilocaine (EMLA) cream APPLY TOPICALLY TO PAC SITE ONE HOUR PRIOR TO INJECTION AS NEEDED 30 g PRN  . loperamide (  IMODIUM) 2 MG capsule Take 2 mg by mouth 4 (four) times daily.     . Multiple Vitamin (MULTIVITAMIN) tablet Take 1 tablet by mouth daily.    . rivaroxaban (XARELTO) 20 MG TABS tablet Take 1 tablet (20 mg total) by mouth daily with supper. 90 tablet 1  . triamcinolone cream (KENALOG) 0.1 %   2   No current facility-administered medications for this visit.     Allergies as of 06/03/2018  . (No Known Allergies)    Family History  Problem Relation Age of Onset  . Kidney cancer Father   . Colon cancer Father        20, 56  . Skin cancer Father        squamous cell-forehead, nose  . Colon cancer Paternal Grandmother   . Prostate cancer Paternal  Grandfather   . Colon cancer Cousin 78       paternal  . Colon cancer Cousin 33       paternal  . Colon cancer Paternal Aunt   . Colon polyps Neg Hx   . Esophageal cancer Neg Hx   . Rectal cancer Neg Hx   . Stomach cancer Neg Hx     Social History   Socioeconomic History  . Marital status: Divorced    Spouse name: Not on file  . Number of children: Not on file  . Years of education: Not on file  . Highest education level: Not on file  Occupational History  . Not on file  Social Needs  . Financial resource strain: Not on file  . Food insecurity:    Worry: Not on file    Inability: Not on file  . Transportation needs:    Medical: Not on file    Non-medical: Not on file  Tobacco Use  . Smoking status: Never Smoker  . Smokeless tobacco: Never Used  Substance and Sexual Activity  . Alcohol use: Yes    Alcohol/week: 2.0 - 4.0 standard drinks    Types: 1 - 2 Cans of beer, 1 - 2 Shots of liquor per week    Comment: 1-2 drinks daily  . Drug use: No  . Sexual activity: Not on file  Lifestyle  . Physical activity:    Days per week: Not on file    Minutes per session: Not on file  . Stress: Not on file  Relationships  . Social connections:    Talks on phone: Not on file    Gets together: Not on file    Attends religious service: Not on file    Active member of club or organization: Not on file    Attends meetings of clubs or organizations: Not on file    Relationship status: Not on file  . Intimate partner violence:    Fear of current or ex partner: Not on file    Emotionally abused: Not on file    Physically abused: Not on file    Forced sexual activity: Not on file  Other Topics Concern  . Not on file  Social History Narrative  . Not on file    Review of Systems:    Constitutional: No weight loss, fever or chills Cardiovascular: No chest pain Respiratory: No SOB Gastrointestinal: See HPI and otherwise negative   Physical Exam:  Vital signs: BP 136/84    Pulse 72   Ht 5\' 9"  (1.753 m)   Wt 161 lb 6 oz (73.2 kg)   BMI 23.83 kg/m   Constitutional:  Pleasant Caucasian male appears to be in NAD, Well developed, Well nourished, alert and cooperative Respiratory: Respirations even and unlabored. Lungs clear to auscultation bilaterally.   No wheezes, crackles, or rhonchi.  Cardiovascular: Normal S1, S2. No MRG. Regular rate and rhythm. No peripheral edema, cyanosis or pallor.  Gastrointestinal:  Soft, nondistended, nontender. No rebound or guarding. Normal bowel sounds. No appreciable masses or hepatomegaly. Psychiatric: Demonstrates good judgement and reason without abnormal affect or behaviors.  Recent labs or imaging.  Assessment: 1.  Lynch syndrome: Patient due for a an EGD and colonoscopy 2.  Recent DVT: On recent anticoagulation with Xarelto started a month ago  Plan: 1.  Patient will need to be scheduled for an EGD and Colonoscopy with Dr. Ardis Hughs in [facility].  Did discuss risks, benefits, limitations and alternatives and the patient agrees to proceed.  We will wait to schedule this until hearing back from Dr. Benay Spice in regards to [place] which was recently started a month ago for DVT.  Likely will need to wait at least 3 to 6 months. 2.  Patient was advised that when it is time for his procedure he will need to hold his Xarelto for 2 days.  Again we will contact Dr. Benay Spice in regards to this. 3.  Patient to follow in clinic per recommendations from Dr. Ardis Hughs after time of procedures.  Ellouise Newer, PA-C [facility] 06/03/2018, 8:21 AM  Cc: Aura Dials, MD

## 2018-06-03 NOTE — Telephone Encounter (Signed)
Dr. Benay Spice,   Terry Vargas was seen in clinic today, he  needs to be scheduled for a colon/EGD. See Alen Blew PA-C assessment below. Please advise on when we can proceed. Thank-you.    Assessment: 1.  Lynch syndrome: Patient due for a an EGD and colonoscopy 2.  Recent DVT: On recent anticoagulation with Xarelto started a month ago  Plan: 1.  Patient will need to be scheduled for an EGD and Colonoscopy with Dr. Ardis Hughs in [facility].  Did discuss risks, benefits, limitations and alternatives and the patient agrees to proceed.  We will wait to schedule this until hearing back from Dr. Benay Spice in regards to [place] which was recently started a month ago for DVT.  Likely will need to wait at least 3 to 6 months. 2.  Patient was advised that when it is time for his procedure he will need to hold his Xarelto for 2 days.  Again we will contact Dr. Benay Spice in regards to this. 3.  Patient to follow in clinic per recommendations from Dr. Ardis Hughs after time of procedures.

## 2018-06-07 NOTE — Telephone Encounter (Signed)
Okay to stop anticoagulation for 2 days prior to the procedure, resume anticoagulation day of the procedure if no biopsy is performed

## 2018-06-09 NOTE — Telephone Encounter (Signed)
Anderson Malta, looks like Dr Benay Spice is okay with doing the procedure, even with starting the Xarelto a month ago, Please advise on the orders to proceed.

## 2018-06-09 NOTE — Telephone Encounter (Signed)
Ok, please schedule for EGD and colon with Dr. Ardis Hughs in the [facility]> Hold xarelto per Dr. Gearldine Shown recs. Thanks-JLL

## 2018-06-09 NOTE — Telephone Encounter (Signed)
Dr Ardis Hughs, Please read the attached communication. Are you okay with Dr Gearldine Shown recommendations to hold Xarelto? He just started a month ago for DVT.  Thanks, Duval Macleod

## 2018-06-10 ENCOUNTER — Telehealth: Payer: Self-pay | Admitting: Gastroenterology

## 2018-06-10 NOTE — Telephone Encounter (Signed)
Yes, thanks

## 2018-06-10 NOTE — Telephone Encounter (Signed)
Pt called stating that we were supposed to call him to either schedule a procedure or tell him that he had to wait due to being on BTs but he has not heard back from [place] yet.

## 2018-06-13 ENCOUNTER — Other Ambulatory Visit: Payer: Self-pay

## 2018-06-13 MED ORDER — NA SULFATE-K SULFATE-MG SULF 17.5-3.13-1.6 GM/177ML PO SOLN: 1.0000 | Freq: Once | ORAL | 0 refills | Status: AC

## 2018-06-13 NOTE — Telephone Encounter (Signed)
Mychart message sent to patient with dates and times. Will await response.

## 2018-06-13 NOTE — Telephone Encounter (Signed)
Scheduled colonoscopy for 06-29-2018 @ 230pm

## 2018-06-13 NOTE — Telephone Encounter (Signed)
Mychart message sent to patient with dates and times.  Will await his response to schedule.

## 2018-06-29 ENCOUNTER — Encounter: Payer: BLUE CROSS/BLUE SHIELD | Admitting: Gastroenterology

## 2018-07-20 ENCOUNTER — Encounter: Payer: BLUE CROSS/BLUE SHIELD | Admitting: Gastroenterology

## 2018-07-20 ENCOUNTER — Encounter: Payer: Self-pay | Admitting: Gastroenterology

## 2018-07-20 ENCOUNTER — Other Ambulatory Visit: Payer: Self-pay

## 2018-07-20 ENCOUNTER — Ambulatory Visit (AMBULATORY_SURGERY_CENTER): Payer: BLUE CROSS/BLUE SHIELD | Admitting: Gastroenterology

## 2018-07-20 VITALS — BP 119/65 | HR 54 | Temp 98.9°F | Resp 16

## 2018-07-20 DIAGNOSIS — Z85038 Personal history of other malignant neoplasm of large intestine: Secondary | ICD-10-CM | POA: Diagnosis present

## 2018-07-20 MED ORDER — SODIUM CHLORIDE 0.9 % IV SOLN: 500.0000 mL | Freq: Once | INTRAVENOUS | Status: DC

## 2018-07-20 NOTE — Progress Notes (Signed)
Report to PACU, RN, vss, BBS= Clear.  

## 2018-07-20 NOTE — Progress Notes (Signed)
Pt's states no medical or surgical changes since previsit or office visit. 

## 2018-07-20 NOTE — Patient Instructions (Signed)
Discharge instructions given. Normal exam. Resume previous medications. Resume blood thinner today. YOU HAD AN ENDOSCOPIC PROCEDURE TODAY AT [facility]:   Refer to the procedure report that was given to you for any specific questions about what was found during the examination.  If the procedure report does not answer your questions, please call your gastroenterologist to clarify.  If you requested that your care partner not be given the details of your procedure findings, then the procedure report has been included in a sealed envelope for you to review at your convenience later.  YOU SHOULD EXPECT: Some feelings of bloating in the abdomen. Passage of more gas than usual.  Walking can help get rid of the air that was put into your GI tract during the procedure and reduce the bloating. If you had a lower endoscopy (such as a colonoscopy or flexible sigmoidoscopy) you may notice spotting of blood in your stool or on the toilet paper. If you underwent a bowel prep for your procedure, you may not have a normal bowel movement for a few days.  Please Note:  You might notice some irritation and congestion in your nose or some drainage.  This is from the oxygen used during your procedure.  There is no need for concern and it should clear up in a day or so.  SYMPTOMS TO REPORT IMMEDIATELY:   Following lower endoscopy (colonoscopy or flexible sigmoidoscopy):  Excessive amounts of blood in the stool  Significant tenderness or worsening of abdominal pains  Swelling of the abdomen that is new, acute  Fever of 100F or higher   For urgent or emergent issues, a gastroenterologist can be reached at any hour by calling [phone].   DIET:  We do recommend a small meal at first, but then you may proceed to your regular diet.  Drink plenty of fluids but you should avoid alcoholic beverages for 24 hours.  ACTIVITY:  You should plan to take it easy for the rest of today and you should NOT  DRIVE or use heavy machinery until tomorrow (because of the sedation medicines used during the test).    FOLLOW UP: Our staff will call the number listed on your records the next business day following your procedure to check on you and address any questions or concerns that you may have regarding the information given to you following your procedure. If we do not reach you, we will leave a message.  However, if you are feeling well and you are not experiencing any problems, there is no need to return our call.  We will assume that you have returned to your regular daily activities without incident.  If any biopsies were taken you will be contacted by phone or by letter within the next 1-3 weeks.  Please call us at [phone] if you have not heard about the biopsies in 3 weeks.    SIGNATURES/CONFIDENTIALITY: You and/or your care partner have signed paperwork which will be entered into your electronic medical record.  These signatures attest to the fact that that the information above on your After Visit Summary has been reviewed and is understood.  Full responsibility of the confidentiality of this discharge information lies with you and/or your care-partner.

## 2018-07-20 NOTE — Op Note (Signed)
[facility] Patient Name: Terry Vargas Procedure Date: 07/20/2018 10:07 AM MRN: [id number] Endoscopist: Milus Banister , MD Age: 58 Referring MD:  Date of Birth: [birth year] Gender: Male Account #: [id number] Procedure:                Colonoscopy Indications:              High risk colon cancer surveillance: Personal                            history of colon cancer, + Lynch Syndrome (MLH1);                            06/02/2017 colonoscopy with Dr. Ardis Hughs with a                            normal-appearing right sided ileocolonic                            anastomosis, internal hemorrhoids and otherwise                            normal exam with no polyps or cancers Medicines:                Monitored Anesthesia Care Procedure:                Pre-Anesthesia Assessment:                           - Prior to the procedure, a History and Physical                            was performed, and patient medications and                            allergies were reviewed. The patient's tolerance of                            previous anesthesia was also reviewed. The risks                            and benefits of the procedure and the sedation                            options and risks were discussed with the patient.                            All questions were answered, and informed consent                            was obtained. Prior Anticoagulants: The patient has                            taken Xarelto (rivaroxaban), last dose was 3 days  prior to procedure. ASA Grade Assessment: III - A                            patient with severe systemic disease. After                            reviewing the risks and benefits, the patient was                            deemed in satisfactory condition to undergo the                            procedure.                           After obtaining informed consent, the colonoscope   was passed under direct vision. Throughout the                            procedure, the patient's blood pressure, pulse, and                            oxygen saturations were monitored continuously. The                            Model CF-HQ190L [id number]) scope was introduced                            through the anus and advanced to the the                            ileocolonic anastomosis. The colonoscopy was                            performed without difficulty. The patient tolerated                            the procedure well. The quality of the bowel                            preparation was good. The appendiceal orifice was                            photographed. Scope In: 10:24:52 AM Scope Out: 10:35:25 AM Scope Withdrawal Time: 0 hours 7 minutes 32 seconds  Total Procedure Duration: 0 hours 10 minutes 33 seconds  Findings:                 Normal right sided ileocolonic anastomosis.                           The examination was otherwise normal. Complications:            No immediate complications. Estimated blood loss:  None. Estimated Blood Loss:     Estimated blood loss: none. Impression:               - Normal ileocolonic anastomosis.                           - No polyps or cancers. Recommendation:           - Patient has a contact number available for                            emergencies. The signs and symptoms of potential                            delayed complications were discussed with the                            patient. Return to normal activities tomorrow.                            Written discharge instructions were provided to the                            patient.                           - Resume previous diet.                           - Continue present medications. OK to restart your                            blood thinner today.                           - Repeat colonoscopy in 1 year for surveillance                             (same day EGD) given MLH1 mutation Lynch Syndrome. Milus Banister, MD 07/20/2018 10:40:00 AM This report has been signed electronically.

## 2018-07-21 ENCOUNTER — Telehealth: Payer: Self-pay

## 2018-07-21 NOTE — Telephone Encounter (Signed)
  Follow up Call-  Call back number 07/20/2018 06/02/2017  Post procedure Call Back phone  # [phone]  Permission to leave phone message Yes Yes  Some recent data might be hidden     Patient questions:  Do you have a fever, pain , or abdominal swelling? No. Pain Score  0 *  Have you tolerated food without any problems? Yes.    Have you been able to return to your normal activities? Yes.    Do you have any questions about your discharge instructions: Diet   No. Medications  No. Follow up visit  No.  Do you have questions or concerns about your Care? No.  Actions: * If pain score is 4 or above: No action needed, pain <4.

## 2018-09-27 ENCOUNTER — Other Ambulatory Visit: Payer: Self-pay

## 2018-09-27 ENCOUNTER — Telehealth: Payer: Self-pay | Admitting: *Deleted

## 2018-09-27 ENCOUNTER — Inpatient Hospital Stay: Payer: BLUE CROSS/BLUE SHIELD | Attending: Oncology

## 2018-09-27 DIAGNOSIS — Z85038 Personal history of other malignant neoplasm of large intestine: Secondary | ICD-10-CM | POA: Diagnosis present

## 2018-09-27 DIAGNOSIS — C182 Malignant neoplasm of ascending colon: Secondary | ICD-10-CM

## 2018-09-27 LAB — CMP (CANCER CENTER ONLY)
ALT: 41 U/L (ref 0–44)
AST: 32 U/L (ref 15–41)
Albumin: 3.8 g/dL (ref 3.5–5.0)
Alkaline Phosphatase: 56 U/L (ref 38–126)
Anion gap: 8 (ref 5–15)
BUN: 21 mg/dL — ABNORMAL HIGH (ref 6–20)
CO2: 24 mmol/L (ref 22–32)
Calcium: 8.7 mg/dL — ABNORMAL LOW (ref 8.9–10.3)
Chloride: 107 mmol/L (ref 98–111)
Creatinine: 1.08 mg/dL (ref 0.61–1.24)
GFR, Est AFR Am: >60 mL/min (ref >60)
GFR, Estimated: >60 mL/min (ref >60)
Glucose, Bld: 111 mg/dL — ABNORMAL HIGH (ref 70–99)
Potassium: 4 mmol/L (ref 3.5–5.1)
Sodium: 139 mmol/L (ref 135–145)
Total Bilirubin: 0.3 mg/dL (ref 0.3–1.2)
Total Protein: 6.7 g/dL (ref 6.5–8.1)

## 2018-09-27 LAB — CBC WITH DIFFERENTIAL (CANCER CENTER ONLY)
Abs Immature Granulocytes: 0.01 10*3/uL (ref 0.00–0.07)
Basophils Absolute: 0 10*3/uL (ref 0.0–0.1)
Basophils Relative: 1 %
Eosinophils Absolute: 0.2 10*3/uL (ref 0.0–0.5)
Eosinophils Relative: 5 %
HCT: 37.8 % — ABNORMAL LOW (ref 39.0–52.0)
Hemoglobin: 12.7 g/dL — ABNORMAL LOW (ref 13.0–17.0)
Immature Granulocytes: 0 %
Lymphocytes Relative: 25 %
Lymphs Abs: 1.2 10*3/uL (ref 0.7–4.0)
MCH: 31.1 pg (ref 26.0–34.0)
MCHC: 33.6 g/dL (ref 30.0–36.0)
MCV: 92.6 fL (ref 80.0–100.0)
Monocytes Absolute: 0.3 10*3/uL (ref 0.1–1.0)
Monocytes Relative: 6 %
Neutro Abs: 3.1 10*3/uL (ref 1.7–7.7)
Neutrophils Relative %: 63 %
Platelet Count: 164 10*3/uL (ref 150–400)
RBC: 4.08 MIL/uL — ABNORMAL LOW (ref 4.22–5.81)
RDW: 12.6 % (ref 11.5–15.5)
WBC Count: 4.9 10*3/uL (ref 4.0–10.5)
nRBC: 0 % (ref 0.0–0.2)

## 2018-09-27 LAB — CEA (IN HOUSE-CHCC): CEA (CHCC-In House): 2.77 ng/mL (ref 0.00–5.00)

## 2018-09-27 NOTE — Telephone Encounter (Signed)
Called to request lab appointment today for his study labs needed before 09/29/18 for [facility]. Has labs there twice year and CT scan annually. Wants to do lab here this time. They are requesting CBC/diff, CMP and CEA. Labs ordered and he will be here at 1:45 pm today.

## 2018-10-07 ENCOUNTER — Ambulatory Visit: Payer: BLUE CROSS/BLUE SHIELD | Admitting: Nurse Practitioner

## 2018-10-09 ENCOUNTER — Other Ambulatory Visit: Payer: Self-pay | Admitting: Oncology

## 2018-10-20 ENCOUNTER — Telehealth: Payer: Self-pay | Admitting: Oncology

## 2018-10-20 NOTE — Telephone Encounter (Signed)
Returned patient's phone call regarding scheduling a lab appointment, left a voicemail.

## 2019-06-06 ENCOUNTER — Ambulatory Visit: Payer: BLUE CROSS/BLUE SHIELD | Attending: Internal Medicine

## 2019-06-06 DIAGNOSIS — Z20822 Contact with and (suspected) exposure to covid-19: Secondary | ICD-10-CM

## 2019-06-07 LAB — NOVEL CORONAVIRUS, NAA: SARS-CoV-2, NAA: NOT DETECTED

## 2019-06-28 ENCOUNTER — Telehealth: Payer: Self-pay | Admitting: Gastroenterology

## 2019-06-28 NOTE — Telephone Encounter (Signed)
Dr Ardis Hughs do you want to see the pt in office first?

## 2019-06-28 NOTE — Telephone Encounter (Signed)
Pt is due for colon. His last one was in March of last year. He is on Xarelto and is asking if he can bypass ov. He stated that he cannot take much time off from work. Please advise if he can be direct booked. Thank you.

## 2019-07-02 NOTE — Telephone Encounter (Signed)
He has lynch syndrome, will be getting colonoscopy annually and so I think it is ok for him not to come in for ov every time.    Can you please verify with his cardiologist that it's ok to hold his xarelto for 2 days prior.  He needs colonoscoy and EGD (lec)  Thanks

## 2019-07-03 NOTE — Telephone Encounter (Signed)
Terry Vargas can you please set this pt up for colon and previsit. Thank you   I have already sent a letter regarding xarelto.

## 2019-07-03 NOTE — Telephone Encounter (Signed)
Called pt to schedule procedure but available dates conflict with his work schedule. I will call pt when April schedule becomes available.

## 2019-07-03 NOTE — Telephone Encounter (Signed)
Noted  

## 2019-07-04 ENCOUNTER — Telehealth: Payer: Self-pay

## 2019-07-04 NOTE — Telephone Encounter (Signed)
-----   Message from Ladell Pier, MD sent at 07/03/2019  5:01 PM EST ----- Ok to hold for 2 days prior to procedure ----- Message ----- From: Timothy Lasso, RN Sent: 07/03/2019  10:37 AM EST To: Ladell Pier, MD

## 2019-07-05 ENCOUNTER — Telehealth: Payer: Self-pay | Admitting: Oncology

## 2019-07-05 NOTE — Telephone Encounter (Signed)
Per 2/17 sch msg. Pt aware of date and time

## 2019-07-14 ENCOUNTER — Encounter: Payer: Self-pay | Admitting: Gastroenterology

## 2019-07-14 NOTE — Telephone Encounter (Signed)
Pt scheduled for colon on 4/6 at 10:30am. Pv scheduled on 3/10 at 8:00am.

## 2019-07-26 ENCOUNTER — Other Ambulatory Visit: Payer: Self-pay

## 2019-07-26 ENCOUNTER — Ambulatory Visit (AMBULATORY_SURGERY_CENTER): Payer: Self-pay | Admitting: *Deleted

## 2019-07-26 VITALS — Temp 97.7°F | Ht 69.0 in | Wt 162.0 lb

## 2019-07-26 DIAGNOSIS — Z85038 Personal history of other malignant neoplasm of large intestine: Secondary | ICD-10-CM

## 2019-07-26 DIAGNOSIS — Z1509 Genetic susceptibility to other malignant neoplasm: Secondary | ICD-10-CM

## 2019-07-26 MED ORDER — SUPREP BOWEL PREP KIT 17.5-3.13-1.6 GM/177ML PO SOLN: 1.0000 | Freq: Once | ORAL | 0 refills | Status: AC

## 2019-07-26 NOTE — Progress Notes (Signed)
[facility]  covid test 4-1  No egg or soy allergy known to patient  No issues with past sedation with any surgeries  or procedures, no intubation problems  No diet pills per patient No home 02 use per patient  Pt is on  blood thinners per patient - takes Xarelto daily - we have 2 day hold  Pt denies issues with constipation  No A fib or A flutter  EMMI video sent to pt's e mail   Due to the COVID-19 pandemic we are asking patients to follow these guidelines. Please only bring one care partner. Please be aware that your care partner may wait in the car in the parking lot or if they feel like they will be too hot to wait in the car, they may wait in the lobby on the 4th floor. All care partners are required to wear a mask the entire time (we do not have any that we can provide them), they need to practice social distancing, and we will do a Covid check for all patient's and care partners when you arrive. Also we will check their temperature and your temperature. If the care partner waits in their car they need to stay in the parking lot the entire time and we will call them on their cell phone when the patient is ready for discharge so they can bring the car to the front of the building. Also all patient's will need to wear a mask into building.

## 2019-08-17 ENCOUNTER — Other Ambulatory Visit
Admission: RE | Admit: 2019-08-17 | Discharge: 2019-08-17 | Disposition: A | Discharge status: Home / Self Care | Payer: BC Managed Care – PPO | Source: Ambulatory Visit | Attending: Gastroenterology | Admitting: Gastroenterology

## 2019-08-17 ENCOUNTER — Other Ambulatory Visit: Payer: Self-pay

## 2019-08-17 DIAGNOSIS — Z20822 Contact with and (suspected) exposure to covid-19: Secondary | ICD-10-CM | POA: Diagnosis not present

## 2019-08-17 DIAGNOSIS — Z01812 Encounter for preprocedural laboratory examination: Secondary | ICD-10-CM | POA: Diagnosis present

## 2019-08-17 LAB — SARS CORONAVIRUS 2 (TAT 6-24 HRS): SARS Coronavirus 2: NEGATIVE

## 2019-08-21 ENCOUNTER — Encounter: Payer: Self-pay | Admitting: Gastroenterology

## 2019-08-22 ENCOUNTER — Other Ambulatory Visit: Payer: Self-pay

## 2019-08-22 ENCOUNTER — Ambulatory Visit (AMBULATORY_SURGERY_CENTER): Payer: BC Managed Care – PPO | Admitting: Gastroenterology

## 2019-08-22 ENCOUNTER — Encounter: Payer: Self-pay | Admitting: Gastroenterology

## 2019-08-22 VITALS — BP 102/65 | HR 45 | Temp 96.9°F | Resp 9 | Ht 69.0 in | Wt 162.0 lb

## 2019-08-22 DIAGNOSIS — K6389 Other specified diseases of intestine: Secondary | ICD-10-CM

## 2019-08-22 DIAGNOSIS — Z1509 Genetic susceptibility to other malignant neoplasm: Secondary | ICD-10-CM | POA: Diagnosis not present

## 2019-08-22 DIAGNOSIS — K635 Polyp of colon: Secondary | ICD-10-CM

## 2019-08-22 DIAGNOSIS — Z85038 Personal history of other malignant neoplasm of large intestine: Secondary | ICD-10-CM

## 2019-08-22 DIAGNOSIS — D125 Benign neoplasm of sigmoid colon: Secondary | ICD-10-CM

## 2019-08-22 DIAGNOSIS — K222 Esophageal obstruction: Secondary | ICD-10-CM

## 2019-08-22 MED ORDER — SODIUM CHLORIDE 0.9 % IV SOLN: 500.0000 mL | Freq: Once | INTRAVENOUS | Status: DC

## 2019-08-22 NOTE — Op Note (Signed)
[place] Patient Name: Terry Vargas Procedure Date: 08/22/2019 9:58 AM MRN: [id number] Endoscopist: Milus Banister , MD Age: 59 Referring MD:  Date of Birth: June 29, 1960 Gender: Male Account #: [id number] Procedure:                Upper GI endoscopy Indications:              Known Lynch Syndrome, previous benign duodenal                            stricture (felt possibly due to chemo medicine)                            that failed endoscopic therapy at [facility] and so he                            underwent gastrojejunostomy in 2017 Medicines:                Monitored Anesthesia Care Procedure:                Pre-Anesthesia Assessment:                           - Prior to the procedure, a History and Physical                            was performed, and patient medications and                            allergies were reviewed. The patient's tolerance of                            previous anesthesia was also reviewed. The risks                            and benefits of the procedure and the sedation                            options and risks were discussed with the patient.                            All questions were answered, and informed consent                            was obtained. Prior Anticoagulants: The patient has                            taken Xarelto (rivaroxaban), last dose was 2 days                            prior to procedure. ASA Grade Assessment: III - A                            patient with severe systemic disease. After  reviewing the risks and benefits, the patient was                            deemed in satisfactory condition to undergo the                            procedure.                           After obtaining informed consent, the endoscope was                            passed under direct vision. Throughout the                            procedure, the patient's blood pressure, pulse, and                  oxygen saturations were monitored continuously. The                            Endoscope was introduced through the mouth, and                            advanced to the duodenal bulb. The upper GI                            endoscopy was accomplished without difficulty. The                            patient tolerated the procedure well. Scope In: Scope Out: Findings:                 The esophagus was normal.                           Gastrojejunal anastomosis was normal. Both efferent                            and afferent limbs were intubated and were normal                            to about 15cm each.                           The previously known distal duodenal bulb stricture                            was still present but was not as severe as in 2016,                            I was able to advance an adult gastroscope through                            the stricture with minor pressure (lumen estimated  to be 6-51mm). Complications:            No immediate complications. Estimated blood loss:                            None Estimated Blood Loss:     Estimated blood loss: none. Impression:               - Normal esophagus.                           - Gastrojejunal anastomosis was normal. Both                            efferent and afferent limbs were intubated and were                            normal to about 15cm each.                           - The previously known distal duodenal bulb                            stricture was still present but was not as severe                            as in 2016, I was able to advance an adult                            gastroscope through the stricture with minor                            pressure (lumen estimated to be 6-53mm). Recommendation:           - Patient has a contact number available for                            emergencies. The signs and symptoms of potential                             delayed complications were discussed with the                            patient. Return to normal activities tomorrow.                            Written discharge instructions were provided to the                            patient.                           - Resume previous diet.                           - Continue present medications. It is safe to  resume your blood thinner tomorrow.                           - Repeat upper endoscopy in 3 years for screening. Milus Banister, MD 08/22/2019 10:35:01 AM This report has been signed electronically.

## 2019-08-22 NOTE — Op Note (Signed)
[place] Patient Name: Terry Vargas Procedure Date: 08/22/2019 9:58 AM MRN: [id number] Endoscopist: Milus Banister , MD Age: 59 Referring MD:  Date of Birth: [birth year] Gender: Male Account #: [id number] Procedure:                Colonoscopy Indications:              High risk colon cancer surveillance: Personal                            history of colon cancer (still sees [facility] oncology)+                            Lynch Syndrome (MLH1); 06/02/2017 colonoscopy with                            Dr. Ardis Hughs with a normal-appearing right sided                            ileocolonic anastomosis, internal hemorrhoids and                            otherwise normal exam with no polyps or cancers.                            Colonoscopy 07/2018 normal right sided IC anastomosis Medicines:                Monitored Anesthesia Care Procedure:                Pre-Anesthesia Assessment:                           - Prior to the procedure, a History and Physical                            was performed, and patient medications and                            allergies were reviewed. The patient's tolerance of                            previous anesthesia was also reviewed. The risks                            and benefits of the procedure and the sedation                            options and risks were discussed with the patient.                            All questions were answered, and informed consent                            was obtained. Prior Anticoagulants: The patient has  taken Xarelto (rivaroxaban), last dose was 2 days                            prior to procedure. ASA Grade Assessment: III - A                            patient with severe systemic disease. After                            reviewing the risks and benefits, the patient was                            deemed in satisfactory condition to undergo the   procedure.                           After obtaining informed consent, the colonoscope                            was passed under direct vision. Throughout the                            procedure, the patient's blood pressure, pulse, and                            oxygen saturations were monitored continuously. The                            Colonoscope was introduced through the anus and                            advanced to the the ileocolonic anastomosis. The                            colonoscopy was performed without difficulty. The                            patient tolerated the procedure well. The quality                            of the bowel preparation was good. The rectum was                            photographed. Scope In: 10:03:50 AM Scope Out: 10:14:37 AM Scope Withdrawal Time: 0 hours 8 minutes 2 seconds  Total Procedure Duration: 0 hours 10 minutes 47 seconds  Findings:                 Slightly erythematous and granular area of his                            right sided ileocolonic anastomosis, measuring 2cm                            across. This is not overtly neoplastic appearing,  biopsies taken to be certain.                           A 2 mm polyp was found in the sigmoid colon. The                            polyp was sessile. The polyp was removed with a                            cold snare. Resection and retrieval were complete.                           Small internal hemorrhoids.                           The exam was otherwise without abnormality on                            direct and retroflexion views. Complications:            No immediate complications. Estimated blood loss:                            None. Estimated Blood Loss:     Estimated blood loss: none. Impression:               - Slightly erythematous and granular area of his                            right sided ileocolonic anastomosis, measuring 2cm                             across. This is not overtly neoplastic appearing,                            biopsies taken to be certain.                           - One 2 mm polyp in the sigmoid colon, removed with                            a cold snare. Resected and retrieved.                           - Small internal hemorrhoids.                           - The examination was otherwise normal on direct                            and retroflexion views. Recommendation:           - EGD now.                           - Await pathology results. Likely repeat  colonoscopy in 1 year given Lynch Syndrome. Milus Banister, MD 08/22/2019 10:27:57 AM This report has been signed electronically.

## 2019-08-22 NOTE — Progress Notes (Signed)
Pt's states no medical or surgical changes since previsit or office visit.  TEMP-JB  V/S-DT

## 2019-08-22 NOTE — Patient Instructions (Signed)
HANDOUTS PROVIDED ON: POLYPS & HEMORRHOIDS  The polyp removed/biopsies taken today have been sent for pathology.  The results can take 1-3 weeks to receive.  When your next colonoscopy should occur will be based on the pathology results.    You may resume your previous diet and medication schedule.  Thank you for allowing [place] to care for you today!!!   YOU HAD AN ENDOSCOPIC PROCEDURE TODAY AT [facility]:   Refer to the procedure report that was given to you for any specific questions about what was found during the examination.  If the procedure report does not answer your questions, please call your gastroenterologist to clarify.  If you requested that your care partner not be given the details of your procedure findings, then the procedure report has been included in a sealed envelope for you to review at your convenience later.  YOU SHOULD EXPECT: Some feelings of bloating in the abdomen. Passage of more gas than usual.  Walking can help get rid of the air that was put into your GI tract during the procedure and reduce the bloating. If you had a lower endoscopy (such as a colonoscopy or flexible sigmoidoscopy) you may notice spotting of blood in your stool or on the toilet paper. If you underwent a bowel prep for your procedure, you may not have a normal bowel movement for a few days.  Please Note:  You might notice some irritation and congestion in your nose or some drainage.  This is from the oxygen used during your procedure.  There is no need for concern and it should clear up in a day or so.  SYMPTOMS TO REPORT IMMEDIATELY:   Following lower endoscopy (colonoscopy or flexible sigmoidoscopy):  Excessive amounts of blood in the stool  Significant tenderness or worsening of abdominal pains  Swelling of the abdomen that is new, acute  Fever of 100F or higher   Following upper endoscopy (EGD)  Vomiting of blood or coffee ground material  New chest pain or pain under  the shoulder blades  Painful or persistently difficult swallowing  New shortness of breath  Fever of 100F or higher  Black, tarry-looking stools  For urgent or emergent issues, a gastroenterologist can be reached at any hour by calling [phone]. Do not use MyChart messaging for urgent concerns.    DIET:  We do recommend a small meal at first, but then you may proceed to your regular diet.  Drink plenty of fluids but you should avoid alcoholic beverages for 24 hours.  ACTIVITY:  You should plan to take it easy for the rest of today and you should NOT DRIVE or use heavy machinery until tomorrow (because of the sedation medicines used during the test).    FOLLOW UP: Our staff will call the number listed on your records 48-72 hours following your procedure to check on you and address any questions or concerns that you may have regarding the information given to you following your procedure. If we do not reach you, we will leave a message.  We will attempt to reach you two times.  During this call, we will ask if you have developed any symptoms of COVID 19. If you develop any symptoms (ie: fever, flu-like symptoms, shortness of breath, cough etc.) before then, please call [phone].  If you test positive for Covid 19 in the 2 weeks post procedure, please call and report this information to [place].    If any biopsies were taken you  will be contacted by phone or by letter within the next 1-3 weeks.  Please call us at [phone] if you have not heard about the biopsies in 3 weeks.    SIGNATURES/CONFIDENTIALITY: You and/or your care partner have signed paperwork which will be entered into your electronic medical record.  These signatures attest to the fact that that the information above on your After Visit Summary has been reviewed and is understood.  Full responsibility of the confidentiality of this discharge information lies with you and/or your care-partner.

## 2019-08-22 NOTE — Progress Notes (Signed)
Called to room to assist during endoscopic procedure.  Patient ID and intended procedure confirmed with present staff. Received instructions for my participation in the procedure from the performing physician.  

## 2019-08-24 ENCOUNTER — Telehealth: Payer: Self-pay

## 2019-08-24 NOTE — Telephone Encounter (Signed)
  Follow up Call-  Call back number 08/22/2019 07/20/2018 06/02/2017  Post procedure Call Back phone  # [phone]  Permission to leave phone message Yes Yes Yes  Some recent data might be hidden     Patient questions:  Do you have a fever, pain , or abdominal swelling? No. Pain Score  0 *  Have you tolerated food without any problems? Yes.    Have you been able to return to your normal activities? Yes.    Do you have any questions about your discharge instructions: Diet   No. Medications  No. Follow up visit  No.  Do you have questions or concerns about your Care? No.  Actions: * If pain score is 4 or above: No action needed, pain <4. 1. Have you developed a fever since your procedure? no  2.   Have you had an respiratory symptoms (SOB or cough) since your procedure? no  3.   Have you tested positive for COVID 19 since your procedure no  4.   Have you had any family members/close contacts diagnosed with the COVID 19 since your procedure?  no   If yes to any of these questions please route to Joylene John, RN and Erenest Rasher, RN

## 2019-08-28 ENCOUNTER — Encounter: Payer: Self-pay | Admitting: Gastroenterology

## 2019-10-09 ENCOUNTER — Other Ambulatory Visit: Payer: Self-pay

## 2019-10-09 ENCOUNTER — Telehealth: Payer: Self-pay | Admitting: Oncology

## 2019-10-09 ENCOUNTER — Inpatient Hospital Stay: Payer: BC Managed Care – PPO | Attending: Oncology | Admitting: Oncology

## 2019-10-09 VITALS — BP 136/82 | HR 60 | Temp 98.1°F | Resp 18 | Ht 69.0 in | Wt 160.4 lb

## 2019-10-09 DIAGNOSIS — Z923 Personal history of irradiation: Secondary | ICD-10-CM | POA: Diagnosis not present

## 2019-10-09 DIAGNOSIS — Z8719 Personal history of other diseases of the digestive system: Secondary | ICD-10-CM | POA: Diagnosis not present

## 2019-10-09 DIAGNOSIS — Z85030 Personal history of malignant carcinoid tumor of large intestine: Secondary | ICD-10-CM | POA: Insufficient documentation

## 2019-10-09 DIAGNOSIS — Z85828 Personal history of other malignant neoplasm of skin: Secondary | ICD-10-CM | POA: Insufficient documentation

## 2019-10-09 DIAGNOSIS — C182 Malignant neoplasm of ascending colon: Secondary | ICD-10-CM

## 2019-10-09 DIAGNOSIS — Z86718 Personal history of other venous thrombosis and embolism: Secondary | ICD-10-CM | POA: Diagnosis not present

## 2019-10-09 NOTE — Telephone Encounter (Signed)
Scheduled per 5/24 los. Pt is mychart active. No AVS and calendar needed.

## 2019-10-09 NOTE — Progress Notes (Signed)
[facility] OFFICE PROGRESS NOTE   Diagnosis: Colon cancer, hereditary nonpolyposis colon cancer syndrome  INTERVAL HISTORY:   Terry Vargas returns as scheduled.  He feels well.  He continues Xarelto anticoagulation.  No symptom of recurrent thrombosis.  No bleeding.  He is exercising. He continues CT and laboratory surveillance with Dr. Reynaldo Minium.  CTs in November 2020 revealed no evidence of recurrent disease. He continues to have irregular bowel habits and occasional diarrhea.  No abdominal pain.  He had a melanoma removed from the right face in November 2020.  He continues dermatology follow-up. He underwent upper and lower endoscopies by Dr. Ardis Hughs on 08/22/2019, erythema was noted at the ileocolonic anastomosis.  A 2 mm polyp was removed from the sigmoid colon.  The biopsy from the anastomosis was benign.  The sigmoid polyp returned as a hyperplastic polyp. Objective:  Vital signs in last 24 hours:  Blood pressure 136/82, pulse 60, temperature 98.1 F (36.7 C), temperature source Temporal, resp. rate 18, height _0  (1.753 m), weight 160 lb 6.4 oz (72.8 kg), SpO2 99 %.     Lymphatics: No cervical, supraclavicular, axillary, or inguinal nodes GI: No mass, nontender, no hepatosplenomegaly Vascular: No leg edema  Skin: Left posterior thigh scar without evidence of recurrent tumor, scar at the right temple/malar area without evidence of recurrent tumor    Medications: I have reviewed the patient's current medications.   Assessment/Plan: 1.  Stage III (T4, N2) adenocarcinoma of the right colon, status post a right colectomy 11/25/2010. K-ras wild-type.  He began adjuvant FOLFOX chemotherapy 12/31/2010. He completed cycle 12 of FOLFOX on 06/03/2011.  #2-PET scan 12/29/2010 with hypermetabolic retroperitoneal and anterior abdominal adenopathy, postoperative changes of the right colectomy with a possible seroma in the anterior abdomen. Patchy activity was noted in the  parotid glands, potentially inflammatory in etiology.  -The hypermetabolic retroperitoneal lymph nodes potentially represented metastatic disease. We presented his case at the GI tumor conference and discussed the case with Dr. Donne Hazel. A decision was made to proceed with "adjuvant" therapy with the plan for a restaging CT at a 3 to four-month interval.  -The restaging CT on 03/23/2011 revealed a decrease in the periaortic lymphadenopathy, no new adenopathy, and no evidence of liver metastases. This suggests the small retroperitoneal lymph nodes may represent metastatic colon cancer versus resolving "inflammatory" nodes.  -Restaging CT 07/13/2011 revealed increased retroperitoneal lymphadenopathy consistent with metastatic disease.  -Restaging PET scan 07/22/2011 confirmed persistent hypermetabolic retroperitoneal lymph nodes. -Restaging CT 11/12/2011 with a slight increase in the size of retroperitoneal/mesenteric lymph nodes, no other evidence of progressive metastatic disease  -Restaging CT 01/27/2012 with a slight increase in the size of a necrotic celiac node, slight decrease in the para-aortic nodes  -Status post palliative radiation to the abdominal/retroperitoneal lymph nodes completed on 04/04/2012  -Restaging CT 05/31/2012 with a slight decrease in the size of abdominal lymph nodes and a stable mass adjacent to the SMA,? New left lung lesion  -Restaging CT 07/29/2012 with new mediastinal/hilar nodes and a new left lingula nodule. New retrocrural and retroperitoneal nodes.  -initiation of FOLFIRI/panitumumab on an every 2 week schedule 08/17/2012.  -restaging CT 10/19/2012 after 5 cycles of FOLFIRI/panitumumab with improvement in mediastinal adenopathy, a left lingular nodule, retroperitoneal nodes, and a stable node adjacent to the spleen.  -Cycle 10 of FOLFIRI/panitumumab on 12/21/2012  -Restaging CT 01/02/2013 with stable disease.  -PET scan 01/27/2013 showed progression of  metastatic disease with increased number and size of mesenteric, retroperitoneal, mediastinal  and bilateral hilar lymph nodes. No extranodal metastatic disease was identified.  - treated on protocol with Nivolumab at [place] CTs on 04/19/2013 confirmed a decreased left lung nodule, a decrease peripancreatic mass, and decreased retroperitoneal lymphadenopathy  -Continuation on every 2 week Nivolumab at [facility]  -Restaging CT 08/23/2013 with stable disease.  -Restaging CT 11/15/2013 with stable disease.  -Restaging CT 02/07/2014 stable. -Restaging CT 05/02/2014 stable. -Restaging CT 10/17/2014-stable disease -Restaging CT 01/09/2015 stable. -Restaging CTs 04/03/2015-stable disease, new nodular/groundglass opacities in the lower lungs bilaterally concerning for infection -Nivolumab discontinued after treatment 04/17/2015 secondary to a duodenal ulcer/stricture -Restaging CTs 09/05/2015-no evidence of disease progression -Restaging CT 12/05/2015-no evidence of disease progression  -Restaging CTs 03/04/2016-no evidence of disease progression -Restaging CTs 05/27/2016-no evidence of disease progression -Restaging CTs 03/25/2017-no evidence of disease progression -Restaging CTs 09/30/2017-no evidence of metastatic disease, stable soft tissue at the celiac artery and superior mesenteric artery -Restaging CTs 03/31/2018-no evidence of recurrent disease, stable soft tissue at the celiac artery and SMA -Restaging CTs at [facility] 04/03/2019-stable retroperitoneal soft tissue, no evidence of recurrent disease 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 A basal cell carcinoma was recently removed from a right finger by Dr. Wilhemina Bonito.  #6-Left leg deep vein thrombosis confirmed on a Doppler ultrasound 01/28/2011.   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 He has persistent mild numbness in the fingertips and toes. The numbness does not interfere with activity.  #8-Mild neutropenia on 03/11/2011, he received Neulasta following cycle #6 of FOLFOX. He also received Neulasta support following cycle 8, cycle 10 and cycle #12.  #9-history of Thrombocytopenia secondary to chemotherapy.  #10-Low back pain-most likely related to the necrotic celiac node. He completed palliative radiation on 04/04/2012. The pain resolved. He presented with progressive back pain likely secondary to progression of retrocrural/retroperitoneal adenopathy. The low back pain has resolved.  #11-Fever/rash on 07/29/2012-? contrast reaction.  #12-Clinical and CT evidence of a fractured Port-A-Cath 07/29/2012. The Port-A-Cath was removed on 08/04/2012. There was a linear laceration in the line. New Port-A-Cath placed on 08/15/2012.  #13-History of skin rash secondary to panitumumab.  #14-Abdominal pain beginning approximately 1 week after treatment occurring with cycles 7 and 8 FOLFIRI/Panitumumab. Status post celiac plexus block 01/12/2013 with no improvement in pain. He is status post a celiac block in interventional radiology on 02/14/2013. The pain has resolved. He is no longer taking pain medication.  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  He continues Xarelto anticoagulation for treatment of recurrent venous thrombosis.  He will continue upper endoscopy and colonoscopy surveillance with Dr. Ardis Hughs.  He continues CEA and CT follow-up with Dr. Reynaldo Minium.  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  He will return for an office visit in 1 year.  Betsy Coder, MD  10/09/2019  8:30 AM

## 2019-10-16 ENCOUNTER — Other Ambulatory Visit: Payer: Self-pay | Admitting: Oncology

## 2020-09-12 ENCOUNTER — Encounter: Payer: Self-pay | Admitting: Gastroenterology

## 2020-10-08 ENCOUNTER — Ambulatory Visit: Payer: BC Managed Care – PPO | Admitting: Nurse Practitioner

## 2020-11-28 ENCOUNTER — Ambulatory Visit (INDEPENDENT_AMBULATORY_CARE_PROVIDER_SITE_OTHER): Payer: BC Managed Care – PPO | Admitting: Gastroenterology

## 2020-11-28 ENCOUNTER — Encounter: Payer: Self-pay | Admitting: Gastroenterology

## 2020-11-28 ENCOUNTER — Telehealth: Payer: Self-pay

## 2020-11-28 VITALS — BP 124/80 | HR 56 | Ht 67.75 in | Wt 159.1 lb

## 2020-11-28 DIAGNOSIS — Z1509 Genetic susceptibility to other malignant neoplasm: Secondary | ICD-10-CM | POA: Diagnosis not present

## 2020-11-28 DIAGNOSIS — Z7901 Long term (current) use of anticoagulants: Secondary | ICD-10-CM | POA: Insufficient documentation

## 2020-11-28 DIAGNOSIS — D6859 Other primary thrombophilia: Secondary | ICD-10-CM | POA: Insufficient documentation

## 2020-11-28 DIAGNOSIS — Z85038 Personal history of other malignant neoplasm of large intestine: Secondary | ICD-10-CM | POA: Insufficient documentation

## 2020-11-28 DIAGNOSIS — Z86718 Personal history of other venous thrombosis and embolism: Secondary | ICD-10-CM | POA: Diagnosis not present

## 2020-11-28 MED ORDER — NA SULFATE-K SULFATE-MG SULF 17.5-3.13-1.6 GM/177ML PO SOLN: 1.0000 | Freq: Once | ORAL | 0 refills | Status: AC

## 2020-11-28 NOTE — Progress Notes (Signed)
I agree with the above note, plan 

## 2020-11-28 NOTE — Progress Notes (Signed)
11/28/2020 Terry Vargas [id number] [birth year]   HISTORY OF PRESENT ILLNESS: This is a 60 year old male who is a patient of Dr. Ardis Hughs.  He has past medical history of Lynch syndrome and personal history of colon cancer diagnosed in 2012.  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  Last colonoscopy was April 2021 that showed the following:  - Slightly erythematous and granular area of his right sided ileocolonic anastomosis, measuring 2cm across. This is not overtly neoplastic appearing, biopsies taken to be certain. - One 2 mm polyp in the sigmoid colon, removed with a cold snare. Resected and retrieved. - Small internal hemorrhoids. - The examination was otherwise normal on direct and retroflexion views.  1. Surgical [P], colon anastomosis - ENTERIC MUCOSA, CLINICALLY ANASTOMOSIS SITE, WITH NO SIGNIFICANT PATHOLOGIC FINDINGS. - NO DYSPLASIA IDENTIFIED. 2. Surgical [P], colon, sigmoid, polyp - HYPERPLASTIC POLYP. [name]  He tells me that he is doing well.  Does not have any GI complaints.  No changes or issues.  He also had an EGD in April 2021 and repeat was recommended in 3 years.  Past Medical History:  Diagnosis Date   Abdominal mass, RUQ (right upper quadrant)    Clotting disorder ([place]) 2012, 2020   dvt left leg- on xarelto    Colon cancer ([place]) 12/02/2010   invasive adenocarcinoma, non-polyposis   DVT (deep venous thrombosis) ([place]) 01/28/11   left leg   Heart murmur    History of chemotherapy    LAST CHEMO 12-23-2012   History of radiation therapy 03/14/12 - 04/04/12   retroperitoneal/abdominal region   Hyperlipidemia    no longer have   Hypertension    no longer have   Skin cancer    squamous cell-L ear, basal cell- face/chest   Past Surgical History:  Procedure Laterality Date    pot-a-cath insertion     COLON SURGERY     COLONOSCOPY     EUS N/A 01/12/2013   Procedure: UPPER ENDOSCOPIC ULTRASOUND (EUS)  LINEAR;  Surgeon: Milus Banister, MD;  Location: [facility];  Service: Endoscopy;  Laterality: N/A;  celiac plexus    gastrojejunostomy     MOHS SURGERY  1996   left ear- squamous cell   open right colectomy  11/25/10   hemicolectomy w/appendectomy   POLYPECTOMY     PORT-A-CATH REMOVAL Left 08/04/2012   Procedure: REMOVAL PORT-A-CATH;  Surgeon: Rolm Bookbinder, MD;  Location: [facility];  Service: General;  Laterality: Left;   PORT-A-CATH REMOVAL  2014   PORTACATH PLACEMENT Left 08/15/2012   Procedure: INSERTION PORT-A-CATH;  Surgeon: Rolm Bookbinder, MD;  Location: [facility];  Service: General;  Laterality: Left;   SKIN CANCER EXCISION     TONSILLECTOMY     as child    reports that he has never smoked. He has never used smokeless tobacco. He reports current alcohol use of about 2.0 - 4.0 standard drinks of alcohol per week. He reports that he does not use drugs. family history includes Colon cancer in his father, paternal aunt, and paternal grandmother; Colon cancer (age of onset: 69) in his cousin; Colon cancer (age of onset: 82) in his cousin; Kidney cancer in his father; Prostate cancer in his paternal grandfather; Skin cancer in his father. No Known Allergies    Outpatient Encounter Medications as of 11/28/2020  Medication Sig   acetaminophen (TYLENOL) 500 MG tablet Take 1,000 mg by mouth every 6 (six) hours as needed for  pain.    aspirin 81 MG chewable tablet Chew by mouth daily.   cholecalciferol (VITAMIN D) 1000 UNITS tablet Take 1,000 Units by mouth daily.   diclofenac sodium (VOLTAREN) 1 % GEL Apply 2 g topically 4 (four) times daily.   diphenhydrAMINE (BENADRYL) 25 MG tablet Take 25 mg by mouth daily.   fluorouracil (EFUDEX) 5 % cream APPLY 1 APPLICATION TO THE AFFECTED AREA ON HANDS BID X 4 WEEKS AND AREAS ON FACE BID X 2 WEEKS   KRILL OIL OMEGA-3 PO Take 300 mg by mouth daily. Pt uses Mega Red Omega 3 Krill Oil   lidocaine-prilocaine (EMLA) cream APPLY  TOPICALLY TO PAC SITE ONE HOUR PRIOR TO INJECTION AS NEEDED   loperamide (IMODIUM) 2 MG capsule Take 2 mg by mouth 4 (four) times daily.    Multiple Vitamin (MULTIVITAMIN) tablet Take 1 tablet by mouth daily.   triamcinolone cream (KENALOG) 0.1 %    XARELTO 20 MG TABS tablet TAKE 1 TABLET(20 MG) BY MOUTH DAILY WITH SUPPER   No facility-administered encounter medications on file as of 11/28/2020.     REVIEW OF SYSTEMS  : All other systems reviewed and negative except where noted in the History of Present Illness.   PHYSICAL EXAM: BP 124/80 (BP Location: Left Arm, Patient Position: Sitting, Cuff Size: Normal)   Pulse (!) 56   Ht 5' 7.75" (1.721 m) Comment: height measured without shoes  Wt 159 lb 2 oz (72.2 kg)   BMI 24.37 kg/m  General: Well developed white male in no acute distress Head: Normocephalic and atraumatic Eyes:  Sclerae anicteric, conjunctiva pink. Ears: Normal auditory acuity Lungs: Clear throughout to auscultation; no W/R/R. Heart: Slightly bradycardic; no M/R/G. Abdomen: Soft, non-distended.  BS present.  Non-tender. Rectal:  Will be done at the time of colonoscopy. Musculoskeletal: Symmetrical with no gross deformities  Skin: No lesions on visible extremities Extremities: No edema  Neurological: Alert oriented x 4, grossly non-focal Psychological:  Alert and cooperative. Normal mood and affect  ASSESSMENT AND PLAN: *Lynch syndrome with personal history of colon cancer in 2012: Last colonoscopy April 2021.  Is due for surveillance colonoscopy.  We will schedule this with Dr. Ardis Hughs. *History of DVTs on anticoagulation with Xarelto:  Will hold Xarelto for 1 day prior to endoscopic procedures - will instruct when and how to resume after procedure. Benefits and risks of procedure explained including risks of bleeding, perforation, infection, missed lesions, reactions to medications and possible need for hospitalization and surgery for complications. Additional rare but  real risk of stroke or other vascular clotting events off Xarelto also explained and need to seek urgent help if any signs of these problems occur. Will communicate by phone or EMR with patient's prescribing provider, Dr. Benay Spice, to confirm that holding Xarelto is reasonable in this case.     CC:  No ref. provider found

## 2020-11-28 NOTE — Telephone Encounter (Signed)
[facility] Pre-operative Risk Assessment     Request for surgical clearance:     Endoscopy Procedure  What type of surgery is being performed?     Colonoscopy  When is this surgery scheduled?     02/26/2021  What type of clearance is required ?   Pharmacy  Are there any medications that need to be held prior to surgery and how long? Xarelto starting 1 day prior  Practice name and name of physician performing surgery?      [facility]  What is your office phone and fax number?      Phone- [phone]  [phone]  Anesthesia type (None, local, MAC, general) ?       MAC

## 2020-11-28 NOTE — Patient Instructions (Signed)
If you are age 60 or younger, your body mass index should be between 19-25. Your Body mass index is 24.37 kg/m. If this is out of the aformentioned range listed, please consider follow up with your Primary Care Provider.  __________________________________________________________  The [facility] providers would like to encourage you to use [facility] to communicate with providers for non-urgent requests or questions.  Due to long hold times on the telephone, sending your provider a message by [facility] may be a faster and more efficient way to get a response.  Please allow 48 business hours for a response.  Please remember that this is for non-urgent requests.   You have been scheduled for a colonoscopy. Please follow written instructions given to you at your visit today.  Please pick up your prep supplies at the pharmacy within the next 1-3 days. If you use inhalers (even only as needed), please bring them with you on the day of your procedure.  You will be contacted by our office prior to your procedure for directions on holding your Xarelto.  If you do not hear from our office 1 week prior to your scheduled procedure, please call [phone] to discuss.   Follow up pending the results of your Colonoscopy.  Thank you for entrusting me with your care and choosing [facility].  Alonza Bogus, PA-C

## 2020-12-02 ENCOUNTER — Encounter: Payer: Self-pay | Admitting: *Deleted

## 2020-12-02 NOTE — Progress Notes (Addendum)
Per Dr. Benay Spice: Needs f/u here w/him. Scheduling message to schedule for Aug/Sept w/BS since Dr. Benay Spice is still ordering/managing his Xarelto.  Per Dr. Benay Spice: Physician at [facility] is managing his Xarelto now. He declines to schedule visit.

## 2020-12-02 NOTE — Telephone Encounter (Signed)
Patient has been notified that he has been okayed to stop his Xarelto a day prior to his procedure.

## 2021-01-07 ENCOUNTER — Ambulatory Visit: Payer: BC Managed Care – PPO | Admitting: Oncology

## 2021-02-12 ENCOUNTER — Encounter: Payer: Self-pay | Admitting: Gastroenterology

## 2021-02-17 ENCOUNTER — Encounter: Payer: Self-pay | Admitting: Oncology

## 2021-02-26 ENCOUNTER — Ambulatory Visit (AMBULATORY_SURGERY_CENTER): Payer: BC Managed Care – PPO | Admitting: Gastroenterology

## 2021-02-26 ENCOUNTER — Encounter: Payer: Self-pay | Admitting: Gastroenterology

## 2021-02-26 ENCOUNTER — Other Ambulatory Visit: Payer: Self-pay

## 2021-02-26 VITALS — BP 134/77 | HR 54 | Temp 98.3°F | Resp 12 | Ht 67.0 in | Wt 159.0 lb

## 2021-02-26 DIAGNOSIS — Z85038 Personal history of other malignant neoplasm of large intestine: Secondary | ICD-10-CM

## 2021-02-26 MED ORDER — SODIUM CHLORIDE 0.9 % IV SOLN: 500.0000 mL | Freq: Once | INTRAVENOUS | Status: DC

## 2021-02-26 NOTE — Progress Notes (Signed)
PT taken to PACU. Monitors in place. VSS. Report given to RN. 

## 2021-02-26 NOTE — Patient Instructions (Signed)
Handouts given on diverticulosis and hemorrhoids.  You can restart your blood thinner today.  Repeat colonoscopy in 1 year due to your Lynch Syndrome.   YOU HAD AN ENDOSCOPIC PROCEDURE TODAY AT [facility]:   Refer to the procedure report that was given to you for any specific questions about what was found during the examination.  If the procedure report does not answer your questions, please call your gastroenterologist to clarify.  If you requested that your care partner not be given the details of your procedure findings, then the procedure report has been included in a sealed envelope for you to review at your convenience later.  YOU SHOULD EXPECT: Some feelings of bloating in the abdomen. Passage of more gas than usual.  Walking can help get rid of the air that was put into your GI tract during the procedure and reduce the bloating. If you had a lower endoscopy (such as a colonoscopy or flexible sigmoidoscopy) you may notice spotting of blood in your stool or on the toilet paper. If you underwent a bowel prep for your procedure, you may not have a normal bowel movement for a few days.  Please Note:  You might notice some irritation and congestion in your nose or some drainage.  This is from the oxygen used during your procedure.  There is no need for concern and it should clear up in a day or so.  SYMPTOMS TO REPORT IMMEDIATELY:  Following lower endoscopy (colonoscopy or flexible sigmoidoscopy):  Excessive amounts of blood in the stool  Significant tenderness or worsening of abdominal pains  Swelling of the abdomen that is new, acute  Fever of 100F or higher   For urgent or emergent issues, a gastroenterologist can be reached at any hour by calling [phone]. Do not use MyChart messaging for urgent concerns.    DIET:  We do recommend a small meal at first, but then you may proceed to your regular diet.  Drink plenty of fluids but you should avoid alcoholic  beverages for 24 hours.  ACTIVITY:  You should plan to take it easy for the rest of today and you should NOT DRIVE or use heavy machinery until tomorrow (because of the sedation medicines used during the test).    FOLLOW UP: Our staff will call the number listed on your records 48-72 hours following your procedure to check on you and address any questions or concerns that you may have regarding the information given to you following your procedure. If we do not reach you, we will leave a message.  We will attempt to reach you two times.  During this call, we will ask if you have developed any symptoms of COVID 19. If you develop any symptoms (ie: fever, flu-like symptoms, shortness of breath, cough etc.) before then, please call [phone].  If you test positive for Covid 19 in the 2 weeks post procedure, please call and report this information to [place].    If any biopsies were taken you will be contacted by phone or by letter within the next 1-3 weeks.  Please call us at [phone] if you have not heard about the biopsies in 3 weeks.    SIGNATURES/CONFIDENTIALITY: You and/or your care partner have signed paperwork which will be entered into your electronic medical record.  These signatures attest to the fact that that the information above on your After Visit Summary has been reviewed and is understood.  Full responsibility of the confidentiality of this  discharge information lies with you and/or your care-partner.  

## 2021-02-26 NOTE — Progress Notes (Signed)
HPI: This is a man with h/o colon cancer, lynch syndrome ([id number])  ROS: complete GI ROS as described in HPI, all other review negative.  Constitutional:  No unintentional weight loss   Past Medical History:  Diagnosis Date   Abdominal mass, RUQ (right upper quadrant)    Clotting disorder ([place]) 2012, 2020   dvt left leg- on xarelto    Colon cancer ([place]) 12/02/2010   invasive adenocarcinoma, non-polyposis   DVT (deep venous thrombosis) ([place]) 01/28/11   left leg   Heart murmur    History of chemotherapy    LAST CHEMO 12-23-2012   History of radiation therapy 03/14/12 - 04/04/12   retroperitoneal/abdominal region   Hyperlipidemia    no longer have   Hypertension    no longer have   Skin cancer    squamous cell-L ear, basal cell- face/chest    Past Surgical History:  Procedure Laterality Date    pot-a-cath insertion     COLON SURGERY     COLONOSCOPY     EUS N/A 01/12/2013   Procedure: UPPER ENDOSCOPIC ULTRASOUND (EUS) LINEAR;  Surgeon: Milus Banister, MD;  Location: [facility];  Service: Endoscopy;  Laterality: N/A;  celiac plexus    gastrojejunostomy     MOHS SURGERY  1996   left ear- squamous cell   open right colectomy  11/25/10   hemicolectomy w/appendectomy   POLYPECTOMY     PORT-A-CATH REMOVAL Left 08/04/2012   Procedure: REMOVAL PORT-A-CATH;  Surgeon: Rolm Bookbinder, MD;  Location: [facility];  Service: General;  Laterality: Left;   PORT-A-CATH REMOVAL  2014   PORTACATH PLACEMENT Left 08/15/2012   Procedure: INSERTION PORT-A-CATH;  Surgeon: Rolm Bookbinder, MD;  Location: [facility];  Service: General;  Laterality: Left;   SKIN CANCER EXCISION     TONSILLECTOMY     as child    Current Outpatient Medications  Medication Sig Dispense Refill   acetaminophen (TYLENOL) 500 MG tablet Take 1,000 mg by mouth every 6 (six) hours as needed for pain.      aspirin 81 MG chewable tablet Chew by mouth daily.     cholecalciferol (VITAMIN D) 1000 UNITS  tablet Take 1,000 Units by mouth daily.     diclofenac sodium (VOLTAREN) 1 % GEL Apply 2 g topically 4 (four) times daily. 300 g 3   diphenhydrAMINE (BENADRYL) 25 MG tablet Take 25 mg by mouth daily.     KRILL OIL OMEGA-3 PO Take 300 mg by mouth daily. Pt uses Mega Red Omega 3 Krill Oil     loperamide (IMODIUM) 2 MG capsule Take 2 mg by mouth 4 (four) times daily.      Multiple Vitamin (MULTIVITAMIN) tablet Take 1 tablet by mouth daily.     fluorouracil (EFUDEX) 5 % cream APPLY 1 APPLICATION TO THE AFFECTED AREA ON HANDS BID X 4 WEEKS AND AREAS ON FACE BID X 2 WEEKS     lidocaine-prilocaine (EMLA) cream APPLY TOPICALLY TO PAC SITE ONE HOUR PRIOR TO INJECTION AS NEEDED 30 g PRN   triamcinolone cream (KENALOG) 0.1 %   2   XARELTO 20 MG TABS tablet TAKE 1 TABLET(20 MG) BY MOUTH DAILY WITH SUPPER 90 tablet 3   Current Facility-Administered Medications  Medication Dose Route Frequency Provider Last Rate Last Admin   0.9 %  sodium chloride infusion  500 mL Intravenous Once Milus Banister, MD        Allergies as of 02/26/2021   (No Known Allergies)  Family History  Problem Relation Age of Onset   Kidney cancer Father    Colon cancer Father        1986, 1991   Skin cancer Father        squamous cell-forehead, nose   Colon cancer Paternal Grandmother    Prostate cancer Paternal Grandfather    Colon cancer Cousin 49       paternal   Colon cancer Cousin 23       paternal   Colon cancer Paternal Aunt    Colon polyps Neg Hx    Esophageal cancer Neg Hx    Rectal cancer Neg Hx    Stomach cancer Neg Hx     Social History   Socioeconomic History   Marital status: Divorced    Spouse name: Not on file   Number of children: Not on file   Years of education: Not on file   Highest education level: Not on file  Occupational History   Not on file  Tobacco Use   Smoking status: Never   Smokeless tobacco: Never  Vaping Use   Vaping Use: Never used  Substance and Sexual Activity    Alcohol use: Yes    Alcohol/week: 2.0 - 4.0 standard drinks    Types: 1 - 2 Cans of beer, 1 - 2 Shots of liquor per week    Comment: 1-2 drinks daily   Drug use: No   Sexual activity: Not on file  Other Topics Concern   Not on file  Social History Narrative   Not on file   Social Determinants of Health   Financial Resource Strain: Not on file  Food Insecurity: Not on file  Transportation Needs: Not on file  Physical Activity: Not on file  Stress: Not on file  Social Connections: Not on file  Intimate Partner Violence: Not on file     Physical Exam: BP (!) 147/71   Pulse (!) 57   Temp 98.3 F (36.8 C)   Ht 5\' 7"  (1.702 m)   Wt 159 lb (72.1 kg)   SpO2 97%   BMI 24.90 kg/m  Constitutional: generally well-appearing Psychiatric: alert and oriented x3 Lungs: CTA bilaterally Heart: no MCR  Assessment and plan: 60 y.o. male with lynch syndrome  Colonoscopy today  Care is appropriate for the ambulatory setting.  Owens Loffler, MD [facility] 02/26/2021, 10:47 AM

## 2021-02-26 NOTE — Op Note (Signed)
[place] Patient Name: Terry Vargas Procedure Date: 02/26/2021 10:43 AM MRN: [id number] Endoscopist: Milus Banister , MD Age: 61 Referring MD:  Date of Birth: 10-31-1960 Gender: Male Account #: [id number] Procedure:                Colonoscopy Indications:              High risk colon cancer surveillance: Personal                            history of colon cancer; Personal history of colon                            cancer Lynch Syndrome (MLH1); 06/02/2017 colonoscopy                            with Dr. Ardis Hughs with a normal-appearing right sided                            ileocolonic anastomosis, internal hemorrhoids and                            otherwise normal exam with no polyps or cancers.                            Colonoscopy 07/2018 normal right sided IC                            anastomosis. Colonoscopy 08/2019 single HP removed. Medicines:                Monitored Anesthesia Care Procedure:                Pre-Anesthesia Assessment:                           - Prior to the procedure, a History and Physical                            was performed, and patient medications and                            allergies were reviewed. The patient's tolerance of                            previous anesthesia was also reviewed. The risks                            and benefits of the procedure and the sedation                            options and risks were discussed with the patient.                            All questions were answered, and informed consent  was obtained. Prior Anticoagulants: xarelto stopped                            2 days ago. ASA Grade Assessment: II - A patient                            with mild systemic disease. After reviewing the                            risks and benefits, the patient was deemed in                            satisfactory condition to undergo the procedure.                           After  obtaining informed consent, the colonoscope                            was passed under direct vision. Throughout the                            procedure, the patient's blood pressure, pulse, and                            oxygen saturations were monitored continuously. The                            Olympus CF-HQ190L ([id number]) Colonoscope was                            introduced through the anus and advanced to the the                            ileocolonic anastomosis. The colonoscopy was                            performed without difficulty. The patient tolerated                            the procedure well. The quality of the bowel                            preparation was good. Scope In: 10:55:07 AM Scope Out: 11:07:23 AM Scope Withdrawal Time: 0 hours 7 minutes 39 seconds  Total Procedure Duration: 0 hours 12 minutes 16 seconds  Findings:                 Normal appearing right sided ileocolonic                            anastomosis.                           Multiple small-mouthed diverticula were found in  the left colon.                           External and internal hemorrhoids were found. The                            hemorrhoids were small.                           The exam was otherwise without abnormality on                            direct and retroflexion views. Complications:            No immediate complications. Estimated Blood Loss:     Estimated blood loss: none. Impression:               - Normal ileocolonic anastomosis                           - Diverticulosis                           - Small hemorrhoids                           - No polyps or cancers. Recommendation:           - Patient has a contact number available for                            emergencies. The signs and symptoms of potential                            delayed complications were discussed with the                            patient. Return to normal  activities tomorrow.                            Written discharge instructions were provided to the                            patient.                           - Resume previous diet.                           - Continue present medications. You can restart                            your blood thinner today.                           - Repeat colonoscopy in 1 year for screening given  your Lynch Syndrome. Milus Banister, MD 02/26/2021 11:13:46 AM This report has been signed electronically.

## 2021-02-28 ENCOUNTER — Telehealth: Payer: Self-pay | Admitting: *Deleted

## 2021-02-28 NOTE — Telephone Encounter (Signed)
  Follow up Call-  Call back number 02/26/2021 08/22/2019 07/20/2018  Post procedure Call Back phone  # [phone]  Permission to leave phone message Yes Yes Yes  Some recent data might be hidden     Patient questions:  Do you have a fever, pain , or abdominal swelling? No. Pain Score  0 *  Have you tolerated food without any problems? Yes.    Have you been able to return to your normal activities? Yes.    Do you have any questions about your discharge instructions: Diet   No. Medications  No. Follow up visit  No.  Do you have questions or concerns about your Care? No.  Actions: * If pain score is 4 or above: No action needed, pain <4.

## 2021-03-06 ENCOUNTER — Other Ambulatory Visit: Payer: Self-pay | Admitting: Orthopedic Surgery

## 2021-03-06 DIAGNOSIS — M25511 Pain in right shoulder: Secondary | ICD-10-CM

## 2021-03-18 ENCOUNTER — Ambulatory Visit
Admission: RE | Admit: 2021-03-18 | Discharge: 2021-03-18 | Disposition: A | Discharge status: Home / Self Care | Payer: BC Managed Care – PPO | Source: Ambulatory Visit | Attending: Orthopedic Surgery | Admitting: Orthopedic Surgery

## 2021-03-18 DIAGNOSIS — M25511 Pain in right shoulder: Secondary | ICD-10-CM

## 2022-01-30 ENCOUNTER — Encounter: Payer: Self-pay | Admitting: Gastroenterology

## 2022-02-03 ENCOUNTER — Telehealth: Payer: Self-pay | Admitting: *Deleted

## 2022-02-03 ENCOUNTER — Encounter: Payer: Self-pay | Admitting: Oncology

## 2022-02-03 NOTE — Telephone Encounter (Signed)
Dr. Bryan Lemma, This is a patient of Dr. Ardis Hughs scheduled with you for colonoscopy on 02/19/22.  He has a hx of colon cancer and Lynch syndrome and has colonoscopies every year.  He also has a history of DVTs and is on Xarelto.  He was seen in the office last year prior to his colonoscopy on 02/26/21.  I called to change his PV to an office visit and explained that this is our policy to be seen in the office if a pt is on a blood thinner.  He would like to know if his prescribing Dr can be contacted by our office about holding the Xarelto without him coming in for an office visit.  We would not be able to get him in with a provider prior to his scheduled time and he would very much like to keep his scheduled appointment rather than having to change.  I explained to him that I would have to consult with you before this is decided.  Please advise.

## 2022-02-03 NOTE — Telephone Encounter (Signed)
Given his history of Lynch syndrome and need for continued short interval surveillance colonoscopies, I think it is reasonable to proceed with PV and contacting prescribing physician for clearance to hold Xarelto without need for OV with me first.  Thanks.

## 2022-02-04 ENCOUNTER — Telehealth: Payer: Self-pay

## 2022-02-04 NOTE — Telephone Encounter (Signed)
 [facility] Pre-operative Risk Assessment     Request for surgical clearance:     Endoscopy Procedure  What type of surgery is being performed?     Colonoscopy   When is this surgery scheduled?     02/19/22  What type of clearance is required ?   Pharmacy  Are there any medications that need to be held prior to surgery and how long? Xarelto 2 day hold  Practice name and name of physician performing surgery?      [facility]  What is your office phone and fax number?      Phone- [phone]  [phone]  Anesthesia type (None, local, MAC, general) ?       MAC

## 2022-02-04 NOTE — Telephone Encounter (Signed)
Cardiac clearance requested.  

## 2022-02-04 NOTE — Telephone Encounter (Signed)
Mickel Baas, Could you please contact prescribing physician about Xarelto hold?  PV is 9/26.  Thank you.

## 2022-02-09 NOTE — Telephone Encounter (Signed)
This pt's previsit is Tuesday the 26th, have we received clearance yet? Thank you.

## 2022-02-10 ENCOUNTER — Ambulatory Visit (AMBULATORY_SURGERY_CENTER): Payer: Self-pay

## 2022-02-10 VITALS — Ht 69.0 in | Wt 155.0 lb

## 2022-02-10 DIAGNOSIS — Z1509 Genetic susceptibility to other malignant neoplasm: Secondary | ICD-10-CM

## 2022-02-10 DIAGNOSIS — Z85038 Personal history of other malignant neoplasm of large intestine: Secondary | ICD-10-CM

## 2022-02-10 MED ORDER — NA SULFATE-K SULFATE-MG SULF 17.5-3.13-1.6 GM/177ML PO SOLN: 1.0000 | Freq: Once | ORAL | 0 refills | Status: AC

## 2022-02-10 NOTE — Progress Notes (Signed)
No egg or soy allergy known to patient  No issues known to pt with past sedation with any surgeries or procedures Patient denies ever being told they had issues or difficulty with intubation  No FH of Malignant Hyperthermia Pt is not on diet pills Pt is not on  home 02  Pt is on blood thinners  Pt denies issues with constipation  No A fib or A flutter Have any cardiac testing pending--no Pt instructed to use [email or web address] or GoodRx for a price reduction on prep

## 2022-02-10 NOTE — Telephone Encounter (Signed)
Spoke with pt and let him know that Dr. Benay Spice gave okay to hold xarelto 2 days prior to procedure. Pt verbalized understanding and had no other concerns at end of call.

## 2022-02-10 NOTE — Telephone Encounter (Signed)
Mickel Baas, during pre visit patient states that Beryle Beams Dropkin, PA at [employer] and would give clearence

## 2022-02-10 NOTE — Telephone Encounter (Signed)
I haven't heard anything yet I can reroute it to Dr. Benay Spice. Dr. Benay Spice please advise if pt can hold Xarelto 2 days prior to colonoscopy on 02/19/22. Thanks!

## 2022-02-11 ENCOUNTER — Encounter: Payer: Self-pay | Admitting: Gastroenterology

## 2022-02-11 ENCOUNTER — Encounter: Payer: Self-pay | Admitting: Oncology

## 2022-02-19 ENCOUNTER — Encounter: Payer: Self-pay | Admitting: Gastroenterology

## 2022-02-19 ENCOUNTER — Ambulatory Visit (AMBULATORY_SURGERY_CENTER): Payer: 59 | Admitting: Gastroenterology

## 2022-02-19 VITALS — BP 120/65 | HR 51 | Temp 98.6°F | Resp 10 | Ht 69.0 in | Wt 155.0 lb

## 2022-02-19 DIAGNOSIS — Z85038 Personal history of other malignant neoplasm of large intestine: Secondary | ICD-10-CM | POA: Diagnosis not present

## 2022-02-19 DIAGNOSIS — Z1509 Genetic susceptibility to other malignant neoplasm: Secondary | ICD-10-CM

## 2022-02-19 DIAGNOSIS — K5730 Diverticulosis of large intestine without perforation or abscess without bleeding: Secondary | ICD-10-CM

## 2022-02-19 DIAGNOSIS — K640 First degree hemorrhoids: Secondary | ICD-10-CM

## 2022-02-19 DIAGNOSIS — Z08 Encounter for follow-up examination after completed treatment for malignant neoplasm: Secondary | ICD-10-CM | POA: Diagnosis present

## 2022-02-19 DIAGNOSIS — Z86718 Personal history of other venous thrombosis and embolism: Secondary | ICD-10-CM

## 2022-02-19 MED ORDER — SODIUM CHLORIDE 0.9 % IV SOLN: 500.0000 mL | Freq: Once | INTRAVENOUS | Status: DC

## 2022-02-19 NOTE — Progress Notes (Signed)
GASTROENTEROLOGY PROCEDURE H&P NOTE   Primary Care Physician: Patient, No Pcp Per    Reason for Procedure:   Hx of Colon cancer, Lynch Syndrome (MLH1 mutation)  Plan:    Colonoscopy  Patient is appropriate for endoscopic procedure(s) in the ambulatory ([facility]) setting.  The nature of the procedure, as well as the risks, benefits, and alternatives were carefully and thoroughly reviewed with the patient. Ample time for discussion and questions allowed. The patient understood, was satisfied, and agreed to proceed.     HPI: Terry Vargas is a 61 y.o. male who presents for Colonoscopy for continued colon cancer surveillance. Personal hx of colon cancer dx-ed 2012 and Lynch Syndrome.   Hx of DVT. Holding Xarelto x2 days for procedure today.   Past Medical History:  Diagnosis Date   Abdominal mass, RUQ (right upper quadrant)    Clotting disorder ([facility]) 2012, 2020   dvt left leg- on xarelto    Colon cancer ([facility]) 12/02/2010   invasive adenocarcinoma, non-polyposis   DVT (deep venous thrombosis) ([facility]) 01/28/11   left leg   Heart murmur    History of chemotherapy    LAST CHEMO 12-23-2012   History of radiation therapy 03/14/12 - 04/04/12   retroperitoneal/abdominal region   Hyperlipidemia    no longer have   Hypertension    no longer have   Skin cancer    squamous cell-L ear, basal cell- face/chest    Past Surgical History:  Procedure Laterality Date    pot-a-cath insertion     COLON SURGERY     COLONOSCOPY     EUS N/A 01/12/2013   Procedure: UPPER ENDOSCOPIC ULTRASOUND (EUS) LINEAR;  Surgeon: Milus Banister, MD;  Location: [facility];  Service: Endoscopy;  Laterality: N/A;  celiac plexus    gastrojejunostomy     MOHS SURGERY  1996   left ear- squamous cell   open right colectomy  11/25/10   hemicolectomy w/appendectomy   POLYPECTOMY     PORT-A-CATH REMOVAL Left 08/04/2012   Procedure: REMOVAL PORT-A-CATH;  Surgeon: Rolm Bookbinder, MD;  Location: [facility];  Service:  General;  Laterality: Left;   PORT-A-CATH REMOVAL  2014   PORTACATH PLACEMENT Left 08/15/2012   Procedure: INSERTION PORT-A-CATH;  Surgeon: Rolm Bookbinder, MD;  Location: [facility];  Service: General;  Laterality: Left;   SKIN CANCER EXCISION     TONSILLECTOMY     as child    Prior to Admission medications   Medication Sig Start Date End Date Taking? Authorizing Provider  aspirin 81 MG chewable tablet Chew by mouth daily.   Yes [provider]  cholecalciferol (VITAMIN D) 1000 UNITS tablet Take 1,000 Units by mouth daily.   Yes [provider]  diphenhydrAMINE (BENADRYL) 25 MG tablet Take 25 mg by mouth daily.   Yes [provider]  KRILL OIL OMEGA-3 PO Take 300 mg by mouth daily. Pt uses Mega Red Omega 3 Krill Oil   Yes [provider]  loperamide (IMODIUM) 2 MG capsule Take 2 mg by mouth 4 (four) times daily.    Yes [provider]  Multiple Vitamin (MULTIVITAMIN) tablet Take 1 tablet by mouth daily.   Yes [provider]  acetaminophen (TYLENOL) 500 MG tablet Take 1,000 mg by mouth every 6 (six) hours as needed for pain.     [provider]  diclofenac sodium (VOLTAREN) 1 % GEL Apply 2 g topically 4 (four) times daily. 02/25/16   Thurman Coyer, DO  fluorouracil (EFUDEX) 5 % cream APPLY 1 APPLICATION TO THE AFFECTED AREA ON HANDS BID X 4 WEEKS AND AREAS ON FACE BID X 2 WEEKS 03/22/19   [provider]  lidocaine-prilocaine (EMLA) cream APPLY TOPICALLY TO PAC SITE ONE HOUR PRIOR TO INJECTION AS NEEDED 01/16/14   Ladell Pier, MD  triamcinolone cream (KENALOG) 0.1 %  10/08/14   [provider]  XARELTO 20 MG TABS tablet TAKE 1 TABLET(20 MG) BY MOUTH DAILY WITH SUPPER 10/17/19   Ladell Pier, MD    Current Outpatient Medications  Medication Sig Dispense Refill   aspirin 81 MG chewable tablet Chew by mouth daily.     cholecalciferol (VITAMIN D) 1000 UNITS tablet Take 1,000 Units by mouth  daily.     diphenhydrAMINE (BENADRYL) 25 MG tablet Take 25 mg by mouth daily.     KRILL OIL OMEGA-3 PO Take 300 mg by mouth daily. Pt uses Mega Red Omega 3 Krill Oil     loperamide (IMODIUM) 2 MG capsule Take 2 mg by mouth 4 (four) times daily.      Multiple Vitamin (MULTIVITAMIN) tablet Take 1 tablet by mouth daily.     acetaminophen (TYLENOL) 500 MG tablet Take 1,000 mg by mouth every 6 (six) hours as needed for pain.      diclofenac sodium (VOLTAREN) 1 % GEL Apply 2 g topically 4 (four) times daily. 300 g 3   fluorouracil (EFUDEX) 5 % cream APPLY 1 APPLICATION TO THE AFFECTED AREA ON HANDS BID X 4 WEEKS AND AREAS ON FACE BID X 2 WEEKS     lidocaine-prilocaine (EMLA) cream APPLY TOPICALLY TO PAC SITE ONE HOUR PRIOR TO INJECTION AS NEEDED 30 g PRN   triamcinolone cream (KENALOG) 0.1 %   2   XARELTO 20 MG TABS tablet TAKE 1 TABLET(20 MG) BY MOUTH DAILY WITH SUPPER 90 tablet 3   Current Facility-Administered Medications  Medication Dose Route Frequency Provider Last Rate Last Admin   0.9 %  sodium chloride infusion  500 mL Intravenous Once Delbert Vu V, DO        Allergies as of 02/19/2022   (No Known Allergies)    Family History  Problem Relation Age of Onset   Kidney cancer Father    Colon cancer Father        1986, 1991   Skin cancer Father        squamous cell-forehead, nose   Colon cancer Paternal Grandmother    Prostate cancer Paternal Grandfather    Colon cancer Cousin 40       paternal   Colon cancer Cousin 45       paternal   Colon cancer Paternal Aunt    Colon polyps Neg Hx    Esophageal cancer Neg Hx    Rectal cancer Neg Hx    Stomach cancer Neg Hx     Social History   Socioeconomic History   Marital status: Divorced    Spouse name: Not on file   Number of children: Not on file   Years of education: Not on file   Highest education level: Not on file  Occupational History   Not on file  Tobacco Use   Smoking status: Never   Smokeless tobacco: Never   Vaping Use   Vaping Use: Never used  Substance and Sexual Activity   Alcohol use: Yes    Alcohol/week: 2.0 - 4.0 standard drinks of alcohol    Types: 1 - 2 Cans of beer, 1 -  2 Shots of liquor per week    Comment: 1-2 drinks daily   Drug use: No   Sexual activity: Not on file  Other Topics Concern   Not on file  Social History Narrative   Not on file   Social Determinants of Health   Financial Resource Strain: Not on file  Food Insecurity: Not on file  Transportation Needs: Not on file  Physical Activity: Not on file  Stress: Not on file  Social Connections: Not on file  Intimate Partner Violence: Not on file    Physical Exam: Vital signs in last 24 hours: _0  135/75   Pulse (!) 53   Temp 98.6 F (37 C)   Ht _1  (1.753 m)   Wt 155 lb (70.3 kg)   SpO2 98%   BMI 22.89 kg/m  GEN: NAD EYE: Sclerae anicteric ENT: MMM CV: Non-tachycardic Pulm: CTA b/l GI: Soft, NT/ND NEURO:  Alert & Oriented x 3   Gerrit Heck, DO [facility]   02/19/2022 10:54 AM

## 2022-02-19 NOTE — Patient Instructions (Signed)
   Handouts provided about hemorrhoids and diverticulosis.  Resume previous diet.  Continue present medications.   Return to GI office in 6 months.  Colonoscopy in 1 year for surveillance.  Upper endoscopy in 1 year for continued surveillance.  Can be scheduled with the colonoscopy.  Resume Xarelto at prior dose today.    YOU HAD AN ENDOSCOPIC PROCEDURE TODAY AT [facility]:   Refer to the procedure report that was given to you for any specific questions about what was found during the examination.  If the procedure report does not answer your questions, please call your gastroenterologist to clarify.  If you requested that your care partner not be given the details of your procedure findings, then the procedure report has been included in a sealed envelope for you to review at your convenience later.  YOU SHOULD EXPECT: Some feelings of bloating in the abdomen. Passage of more gas than usual.  Walking can help get rid of the air that was put into your GI tract during the procedure and reduce the bloating. If you had a lower endoscopy (such as a colonoscopy or flexible sigmoidoscopy) you may notice spotting of blood in your stool or on the toilet paper. If you underwent a bowel prep for your procedure, you may not have a normal bowel movement for a few days.  Please Note:  You might notice some irritation and congestion in your nose or some drainage.  This is from the oxygen used during your procedure.  There is no need for concern and it should clear up in a day or so.  SYMPTOMS TO REPORT IMMEDIATELY:  Following lower endoscopy (colonoscopy or flexible sigmoidoscopy):  Excessive amounts of blood in the stool  Significant tenderness or worsening of abdominal pains  Swelling of the abdomen that is new, acute  Fever of 100F or higher   For urgent or emergent issues, a gastroenterologist can be reached at any hour by calling [phone]. Do not use MyChart messaging for urgent  concerns.    DIET:  We do recommend a small meal at first, but then you may proceed to your regular diet.  Drink plenty of fluids but you should avoid alcoholic beverages for 24 hours.  ACTIVITY:  You should plan to take it easy for the rest of today and you should NOT DRIVE or use heavy machinery until tomorrow (because of the sedation medicines used during the test).    FOLLOW UP: Our staff will call the number listed on your records the next business day following your procedure.  We will call around 7:15- 8:00 am to check on you and address any questions or concerns that you may have regarding the information given to you following your procedure. If we do not reach you, we will leave a message.     If any biopsies were taken you will be contacted by phone or by letter within the next 1-3 weeks.  Please call us at [phone] if you have not heard about the biopsies in 3 weeks.    SIGNATURES/CONFIDENTIALITY: You and/or your care partner have signed paperwork which will be entered into your electronic medical record.  These signatures attest to the fact that that the information above on your After Visit Summary has been reviewed and is understood.  Full responsibility of the confidentiality of this discharge information lies with you and/or your care-partner.

## 2022-02-19 NOTE — Op Note (Signed)
[facility] Patient Name: Alie Hardgrove Procedure Date: 02/19/2022 10:54 AM MRN: [id number] Endoscopist: Gerrit Heck , MD Age: 61 Referring MD:  Date of Birth: 1960-10-13 Gender: Male Account #: [id number] Procedure:                Colonoscopy Indications:              High risk colon cancer surveillance: Personal                            history of colon cancer                           Personal history of Lynch Syndrome (MLH1);                            06/02/2017 colonoscopy with Dr. Ardis Hughs with a normal                            appearing right sided ileocolonic anastomosis,                            internal hemorrhoids and otherwise normal                           exam with no polyps or cancers. Colonoscopy 07/2018                            normal right sided IC anastomosis.                           Colonoscopy 08/2019 single HP removed. Colonoscopy                            02/2021 with normal appearing anastamosis, left                            sided diverticulosis, hemorrhoids. Medicines:                Monitored Anesthesia Care Procedure:                Pre-Anesthesia Assessment:                           - Prior to the procedure, a History and Physical                            was performed, and patient medications and                            allergies were reviewed. The patient's tolerance of                            previous anesthesia was also reviewed. The risks                            and benefits of the procedure and the  sedation                            options and risks were discussed with the patient.                            All questions were answered, and informed consent                            was obtained. Prior Anticoagulants: The patient has                            taken Xarelto (rivaroxaban), last dose was 2 days                            prior to procedure. ASA Grade Assessment: II - A                             patient with mild systemic disease. After reviewing                            the risks and benefits, the patient was deemed in                            satisfactory condition to undergo the procedure.                           After obtaining informed consent, the colonoscope                            was passed under direct vision. Throughout the                            procedure, the patient's blood pressure, pulse, and                            oxygen saturations were monitored continuously. The                            Olympus PCF-H190DL (#[id number]) Colonoscope was                            introduced through the anus and advanced to the the                            ileocolonic anastomosis. The colonoscopy was                            performed without difficulty. The patient tolerated                            the procedure well. The quality of the bowel  preparation was good. Scope In: 11:04:03 AM Scope Out: 11:19:39 AM Scope Withdrawal Time: 0 hours 11 minutes 14 seconds  Total Procedure Duration: 0 hours 15 minutes 36 seconds  Findings:                 Hemorrhoids were found on perianal exam.                           There was evidence of a prior end-to-side                            ileo-colonic anastomosis in the transverse colon.                            This was patent and was characterized by healthy                            appearing mucosa. The anastomosis was traversed.                           A few small and large-mouthed diverticula were                            found in the left colon.                           Non-bleeding external and internal hemorrhoids were                            found during retroflexion. The hemorrhoids were                            small.                           The neo-terminal ileum appeared normal. Complications:            No immediate complications. Estimated Blood Loss:      Estimated blood loss: none. Impression:               - Hemorrhoids found on perianal exam.                           - Patent end-to-side ileo-colonic anastomosis,                            characterized by healthy appearing mucosa.                           - Diverticulosis in the left colon.                           - Non-bleeding external and internal hemorrhoids.                           - The examined portion of the ileum was normal.                           -  No specimens collected. Recommendation:           - Patient has a contact number available for                            emergencies. The signs and symptoms of potential                            delayed complications were discussed with the                            patient. Return to normal activities tomorrow.                            Written discharge instructions were provided to the                            patient.                           - Resume previous diet.                           - Continue present medications.                           - Repeat colonoscopy in 1 year for surveillance.                           - Return to GI office in 6 months.                           - Upper endoscopy in 1 year for continued                            surveillance. Can be scheduled with the colonoscopy.                           - Resume Xarelto (rivaroxaban) at prior dose today. Gerrit Heck, MD 02/19/2022 11:26:21 AM

## 2022-02-19 NOTE — Progress Notes (Signed)
Pt's states no medical or surgical changes since previsit or office visit. 

## 2022-02-19 NOTE — Progress Notes (Signed)
PT taken to PACU. Monitors in place. VSS. Report given to RN. 

## 2022-02-20 ENCOUNTER — Telehealth: Payer: Self-pay | Admitting: *Deleted

## 2022-02-20 NOTE — Telephone Encounter (Signed)
Follow up call attempt.  LVM to call if any questions or concerns. 

## 2022-07-14 ENCOUNTER — Encounter: Payer: Self-pay | Admitting: Gastroenterology

## 2022-11-20 ENCOUNTER — Encounter: Payer: Self-pay | Admitting: Gastroenterology

## 2023-05-26 ENCOUNTER — Encounter: Payer: Self-pay | Admitting: Oncology

## 2023-06-08 ENCOUNTER — Other Ambulatory Visit (INDEPENDENT_AMBULATORY_CARE_PROVIDER_SITE_OTHER): Payer: Commercial Managed Care - HMO

## 2023-06-08 ENCOUNTER — Ambulatory Visit: Payer: Commercial Managed Care - HMO | Admitting: Orthopaedic Surgery

## 2023-06-08 DIAGNOSIS — M1612 Unilateral primary osteoarthritis, left hip: Secondary | ICD-10-CM

## 2023-06-08 HISTORY — DX: Unilateral primary osteoarthritis, left hip: M16.12

## 2023-06-08 MED ORDER — TRAMADOL HCL 50 MG PO TABS: 50.0000 mg | ORAL_TABLET | Freq: Every day | ORAL | 1 refills | Status: DC | PRN

## 2023-06-08 NOTE — Progress Notes (Signed)
Office Visit Note   Patient: Terry Vargas           Date of Birth: August 16, 1960           MRN: [id number] Visit Date: 06/08/2023              Requested by: No referring provider defined for this encounter. PCP: Patient, No Pcp Per   Assessment & Plan: Visit Diagnoses:  1. Primary osteoarthritis of left hip     Plan: Terry Vargas is a very pleasant 63 year old gentleman with bone-on-bone end-stage left hip DJD.  Findings were reviewed and treatment options were given.  He is elected to move forward with a left total hip replacement pending preoperative clearance.  Impression is severe left hip degenerative joint disease secondary to Osteoarthritis.  Imaging shows bone on bone joint space narrowing.  At this point, conservative treatments fail to provide any significant relief and the pain is severely affecting ADLs and quality of life.  Based on treatment options, the patient has elected to move forward with a hip replacement.  We have discussed the surgical risks that include but are not limited to infection, DVT, leg length discrepancy, numbness, tingling, incomplete relief of pain.  Recovery and prognosis were also reviewed.    Current anticoagulants: Xarelto (rivaroxaban) daily Postop anticoagulation: Xarelto Diabetic: No  Prior DVT/PE: Yes Tobacco use: No Clearances needed for surgery: [place], oncologist at [facility] Anticipate discharge dispo: outpatient   Follow-Up Instructions: No follow-ups on file.   Orders:  Orders Placed This Encounter  Procedures   XR HIP UNILAT W OR W/O PELVIS 1V LEFT   Meds ordered this encounter  Medications   traMADol (ULTRAM) 50 MG tablet    Sig: Take 1-2 tablets (50-100 mg total) by mouth daily as needed.    Dispense:  30 tablet    Refill:  1      Procedures: No procedures performed   Clinical Data: No additional findings.   Subjective: Chief Complaint  Patient presents with   Left Hip - Pain    HPI Terry Vargas is a very pleasant  63 year old gentleman here for evaluation of left hip DJD.  He is very active gentleman.  He is semiretired.  Volunteers at [employer].  Has trouble going up stairs due to the pain.  Has pain and weakness with lifting his left leg.  He does have a history of Lynch syndrome and stage IV colon cancer.  Currently on Xarelto for history of DVTs. Review of Systems  Constitutional: Negative.   HENT: Negative.    Eyes: Negative.   Respiratory: Negative.    Cardiovascular: Negative.   Gastrointestinal: Negative.   Endocrine: Negative.   Genitourinary: Negative.   Skin: Negative.   Allergic/Immunologic: Negative.   Neurological: Negative.   Hematological: Negative.   Psychiatric/Behavioral: Negative.    All other systems reviewed and are negative.    Objective: Vital Signs: There were no vitals taken for this visit.  Physical Exam Vitals and nursing note reviewed.  Constitutional:      Appearance: He is well-developed.  HENT:     Head: Normocephalic and atraumatic.  Eyes:     Pupils: Pupils are equal, round, and reactive to light.  Pulmonary:     Effort: Pulmonary effort is normal.  Abdominal:     Palpations: Abdomen is soft.  Musculoskeletal:        General: Normal range of motion.     Cervical back: Neck supple.  Skin:    General:  Skin is warm.  Neurological:     Mental Status: He is alert and oriented to person, place, and time.  Psychiatric:        Behavior: Behavior normal.        Thought Content: Thought content normal.        Judgment: Judgment normal.     Ortho Exam Examination left hip shows weakness with hip flexion.  He has pain with logroll and Stinchfield and FADIR. Specialty Comments:  No specialty comments available.  Imaging: XR HIP UNILAT W OR W/O PELVIS 1V LEFT Result Date: 06/08/2023 X-rays of the pelvis show bone-on-bone joint space narrowing of the left hip joint with cam deformities of femoral heads.    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   Skin cancer    DVT (deep venous thrombosis) ([facility]) 09/23/2011   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   DVT of leg (deep venous thrombosis) ([facility]) 01/28/2011   Colon cancer ([facility]) 12/02/2010   Past Medical History:  Diagnosis Date   Abdominal mass, RUQ (right upper quadrant)    Clotting disorder ([facility]) 2012, 2020   dvt left leg- on xarelto    Colon cancer ([facility]) 12/02/2010   invasive adenocarcinoma, non-polyposis   DVT (deep venous thrombosis) ([facility]) 01/28/11   left leg   Heart murmur    History of chemotherapy    LAST CHEMO 12-23-2012   History of radiation therapy 03/14/12 - 04/04/12   retroperitoneal/abdominal region   Hyperlipidemia    no longer have   Hypertension    no longer have   Skin cancer    squamous cell-L ear, basal cell- face/chest    Family History  Problem Relation Age of Onset   Kidney cancer Father    Colon cancer Father        1986, 1991   Skin cancer Father        squamous cell-forehead, nose   Colon cancer Paternal Grandmother    Prostate cancer Paternal Grandfather    Colon cancer Cousin 35       paternal   Colon cancer Cousin 53       paternal   Colon cancer Paternal Aunt    Colon polyps Neg Hx    Esophageal cancer Neg Hx    Rectal cancer Neg Hx    Stomach cancer Neg Hx     Past Surgical History:  Procedure Laterality Date    pot-a-cath insertion     COLON SURGERY     COLONOSCOPY     EUS N/A 01/12/2013   Procedure: UPPER ENDOSCOPIC ULTRASOUND (EUS) LINEAR;  Surgeon: Rachael Fee, MD;  Location: [facility];  Service: Endoscopy;   Laterality: N/A;  celiac plexus    gastrojejunostomy     MOHS SURGERY  1996   left ear- squamous cell   open right colectomy  11/25/10   hemicolectomy w/appendectomy   POLYPECTOMY     PORT-A-CATH REMOVAL Left 08/04/2012   Procedure: REMOVAL PORT-A-CATH;  Surgeon: Emelia Loron, MD;  Location: [facility];  Service: General;  Laterality: Left;   PORT-A-CATH REMOVAL  2014  PORTACATH PLACEMENT Left 08/15/2012   Procedure: INSERTION PORT-A-CATH;  Surgeon: Emelia Loron, MD;  Location: [facility];  Service: General;  Laterality: Left;   SKIN CANCER EXCISION     TONSILLECTOMY     as child   Social History   Occupational History   Not on file  Tobacco Use   Smoking status: Never   Smokeless tobacco: Never  Vaping Use   Vaping status: Never Used  Substance and Sexual Activity   Alcohol use: Yes    Alcohol/week: 2.0 - 4.0 standard drinks of alcohol    Types: 1 - 2 Cans of beer, 1 - 2 Shots of liquor per week    Comment: 1-2 drinks daily   Drug use: No   Sexual activity: Not on file

## 2023-06-29 ENCOUNTER — Other Ambulatory Visit: Payer: Self-pay

## 2023-07-15 ENCOUNTER — Telehealth: Payer: Self-pay | Admitting: Orthopaedic Surgery

## 2023-07-15 ENCOUNTER — Other Ambulatory Visit: Payer: Self-pay | Admitting: Physician Assistant

## 2023-07-15 MED ORDER — HYDROCODONE-ACETAMINOPHEN 5-325 MG PO TABS: 1.0000 | ORAL_TABLET | Freq: Two times a day (BID) | ORAL | 0 refills | Status: DC | PRN

## 2023-07-15 NOTE — Telephone Encounter (Signed)
 Rx refill Tramadol Patient wants to know if something else is available instead of the Tramadol   [facility] on [address]

## 2023-07-15 NOTE — Telephone Encounter (Signed)
 Sent in a small rx of norco

## 2023-07-15 NOTE — Telephone Encounter (Signed)
 Spoke with patient. He is having a difficult time sleeping. He is going to try Ibuprofen at bedtime, along with Tramadol if needed. He will let us know how that goes. We did briefly talk about pain meds before surgery and that it can make it more difficult to control pain after.

## 2023-07-21 ENCOUNTER — Telehealth: Payer: Self-pay | Admitting: Orthopaedic Surgery

## 2023-07-21 NOTE — Telephone Encounter (Signed)
 Patient called would like some pain medication called in for him.

## 2023-07-22 ENCOUNTER — Other Ambulatory Visit: Payer: Self-pay | Admitting: Physician Assistant

## 2023-07-22 MED ORDER — HYDROCODONE-ACETAMINOPHEN 5-325 MG PO TABS: 1.0000 | ORAL_TABLET | Freq: Two times a day (BID) | ORAL | 0 refills | Status: DC | PRN

## 2023-07-22 NOTE — Telephone Encounter (Signed)
 Sent in another small rx.  Please take as sparingly as possible so that it will help after surgery

## 2023-07-22 NOTE — Telephone Encounter (Signed)
 Sent mychart msg with details below.

## 2023-07-22 NOTE — Telephone Encounter (Signed)
Is he scheduled for surgery?

## 2023-07-23 ENCOUNTER — Other Ambulatory Visit: Payer: Self-pay | Admitting: Physician Assistant

## 2023-07-23 MED ORDER — TRAMADOL HCL 50 MG PO TABS: 50.0000 mg | ORAL_TABLET | Freq: Every day | ORAL | 1 refills | Status: DC | PRN

## 2023-07-23 NOTE — Telephone Encounter (Signed)
 Sent in tramadol

## 2023-08-03 ENCOUNTER — Other Ambulatory Visit: Payer: Self-pay | Admitting: Physician Assistant

## 2023-08-03 MED ORDER — METHOCARBAMOL 750 MG PO TABS: 750.0000 mg | ORAL_TABLET | Freq: Three times a day (TID) | ORAL | 2 refills | Status: DC | PRN

## 2023-08-03 MED ORDER — ONDANSETRON HCL 4 MG PO TABS: 4.0000 mg | ORAL_TABLET | Freq: Three times a day (TID) | ORAL | 0 refills | Status: DC | PRN

## 2023-08-03 MED ORDER — OXYCODONE-ACETAMINOPHEN 5-325 MG PO TABS: 1.0000 | ORAL_TABLET | Freq: Four times a day (QID) | ORAL | 0 refills | Status: DC | PRN

## 2023-08-03 MED ORDER — DOCUSATE SODIUM 100 MG PO CAPS: 100.0000 mg | ORAL_CAPSULE | Freq: Every day | ORAL | 2 refills | Status: DC | PRN

## 2023-08-04 ENCOUNTER — Encounter: Payer: Self-pay | Admitting: Orthopaedic Surgery

## 2023-08-11 NOTE — Pre-Procedure Instructions (Signed)
 Surgical Instructions   Your procedure is scheduled on Monday, March 31st. Report to [facility] Main Entrance "A" at 06:00 A.M., then check in with the Admitting office. Any questions or running late day of surgery: call [phone]  Questions prior to your surgery date: call [phone], Monday-Friday, 8am-4pm. If you experience any cold or flu symptoms such as cough, fever, chills, shortness of breath, etc. between now and your scheduled surgery, please notify us at the above number.     Remember:  Do not eat after midnight the night before your surgery  You may drink clear liquids until 05:30 AM the morning of your surgery.   Clear liquids allowed are: Water, Non-Citrus Juices (without pulp), Carbonated Beverages, Clear Tea (no milk, honey, etc.), Black Coffee Only (NO MILK, CREAM OR POWDERED CREAMER of any kind), and Gatorade.  Patient Instructions  The night before surgery:  No food after midnight. ONLY clear liquids after midnight  The day of surgery (if you do NOT have diabetes):  Drink ONE (1) Pre-Surgery Clear Ensure by 05:30 AM the morning of surgery. Drink in one sitting. Do not sip.  This drink was given to you during your hospital  pre-op appointment visit.  Nothing else to drink after completing the  Pre-Surgery Clear Ensure.          If you have questions, please contact your surgeon's office.    Take these medicines the morning of surgery with A SIP OF WATER  May take these medicines IF NEEDED: acetaminophen (TYLENOL)     Follow your surgeon's instructions on when to stop Aspirin.  If no instructions were given by your surgeon then you will need to call the office to get those instructions.    STOP taking Xarelto 3 days prior to surgery. Last dose 3/27.  One week prior to surgery, STOP taking any Aleve, Naproxen, Ibuprofen, Motrin, Advil, Goody's, BC's, all herbal medications, fish oil, and non-prescription vitamins. This includes diclofenac sodium  (VOLTAREN) gel.            Do NOT Smoke (Tobacco/Vaping) for 24 hours prior to your procedure.  If you use a CPAP at night, you may bring your mask/headgear for your overnight stay.   You will be asked to remove any contacts, glasses, piercing's, hearing aid's, dentures/partials prior to surgery. Please bring cases for these items if needed.    Patients discharged the day of surgery will not be allowed to drive home, and someone needs to stay with them for 24 hours.  SURGICAL WAITING ROOM VISITATION Patients may have no more than 2 support people in the waiting area - these visitors may rotate.   Pre-op nurse will coordinate an appropriate time for 1 ADULT support person, who may not rotate, to accompany patient in pre-op.  Children under the age of 84 must have an adult with them who is not the patient and must remain in the main waiting area with an adult.  If the patient needs to stay at the hospital during part of their recovery, the visitor guidelines for inpatient rooms apply.  Please refer to the [facility] website for the visitor guidelines for any additional information.   If you received a COVID test during your pre-op visit  it is requested that you wear a mask when out in public, stay away from anyone that may not be feeling well and notify your surgeon if you develop symptoms. If you have been in contact with anyone that has tested positive in  the last 10 days please notify you surgeon.      Pre-operative 5 CHG Bathing Instructions   You can play a key role in reducing the risk of infection after surgery. Your skin needs to be as free of germs as possible. You can reduce the number of germs on your skin by washing with CHG (chlorhexidine gluconate) soap before surgery. CHG is an antiseptic soap that kills germs and continues to kill germs even after washing.   DO NOT use if you have an allergy to chlorhexidine/CHG or antibacterial soaps. If your skin becomes reddened or  irritated, stop using the CHG and notify one of our RNs at [phone].   Please shower with the CHG soap starting 4 days before surgery using the following schedule:     Please keep in mind the following:  DO NOT shave, including legs and underarms, starting the day of your first shower.   You may shave your face at any point before/day of surgery.  Place clean sheets on your bed the day you start using CHG soap. Use a clean washcloth (not used since being washed) for each shower. DO NOT sleep with pets once you start using the CHG.   CHG Shower Instructions:  Wash your face and private area with normal soap. If you choose to wash your hair, wash first with your normal shampoo.  After you use shampoo/soap, rinse your hair and body thoroughly to remove shampoo/soap residue.  Turn the water OFF and apply about 3 tablespoons (45 ml) of CHG soap to a CLEAN washcloth.  Apply CHG soap ONLY FROM YOUR NECK DOWN TO YOUR TOES (washing for 3-5 minutes)  DO NOT use CHG soap on face, private areas, open wounds, or sores.  Pay special attention to the area where your surgery is being performed.  If you are having back surgery, having someone wash your back for you may be helpful. Wait 2 minutes after CHG soap is applied, then you may rinse off the CHG soap.  Pat dry with a clean towel  Put on clean clothes/pajamas   If you choose to wear lotion, please use ONLY the CHG-compatible lotions that are listed below.  Additional instructions for the day of surgery: DO NOT APPLY any lotions, deodorants, cologne, or perfumes.   Do not bring valuables to the hospital. [facility] is not responsible for any belongings/valuables. Do not wear nail polish, gel polish, artificial nails, or any other type of covering on natural nails (fingers and toes) Do not wear jewelry or makeup Put on clean/comfortable clothes.  Please brush your teeth.  Ask your nurse before applying any prescription medications to the  skin.     CHG Compatible Lotions   Aveeno Moisturizing lotion  Cetaphil Moisturizing Cream  Cetaphil Moisturizing Lotion  Clairol Herbal Essence Moisturizing Lotion, Dry Skin  Clairol Herbal Essence Moisturizing Lotion, Extra Dry Skin  Clairol Herbal Essence Moisturizing Lotion, Normal Skin  Curel Age Defying Therapeutic Moisturizing Lotion with Alpha Hydroxy  Curel Extreme Care Body Lotion  Curel Soothing Hands Moisturizing Hand Lotion  Curel Therapeutic Moisturizing Cream, Fragrance-Free  Curel Therapeutic Moisturizing Lotion, Fragrance-Free  Curel Therapeutic Moisturizing Lotion, Original Formula  Eucerin Daily Replenishing Lotion  Eucerin Dry Skin Therapy Plus Alpha Hydroxy Crme  Eucerin Dry Skin Therapy Plus Alpha Hydroxy Lotion  Eucerin Original Crme  Eucerin Original Lotion  Eucerin Plus Crme Eucerin Plus Lotion  Eucerin TriLipid Replenishing Lotion  Keri Anti-Bacterial Hand Lotion  Keri Deep Conditioning Original Lotion Dry  Skin Formula Softly Scented  Keri Deep Conditioning Original Lotion, Fragrance Free Sensitive Skin Formula  Keri Lotion Fast Absorbing Fragrance Free Sensitive Skin Formula  Keri Lotion Fast Absorbing Softly Scented Dry Skin Formula  Keri Original Lotion  Keri Skin Renewal Lotion Keri Silky Smooth Lotion  Keri Silky Smooth Sensitive Skin Lotion  Nivea Body Creamy Conditioning Oil  Nivea Body Extra Enriched Lotion  Nivea Body Original Lotion  Nivea Body Sheer Moisturizing Lotion Nivea Crme  Nivea Skin Firming Lotion  NutraDerm 30 Skin Lotion  NutraDerm Skin Lotion  NutraDerm Therapeutic Skin Cream  NutraDerm Therapeutic Skin Lotion  ProShield Protective Hand Cream  Provon moisturizing lotion  Please read over the following fact sheets that you were given.

## 2023-08-12 ENCOUNTER — Encounter (HOSPITAL_COMMUNITY)
Admission: RE | Admit: 2023-08-12 | Discharge: 2023-08-12 | Disposition: A | Discharge status: Home / Self Care | Source: Ambulatory Visit | Attending: Orthopaedic Surgery | Admitting: Orthopaedic Surgery

## 2023-08-12 ENCOUNTER — Other Ambulatory Visit: Payer: Self-pay

## 2023-08-12 ENCOUNTER — Encounter (HOSPITAL_COMMUNITY): Payer: Self-pay

## 2023-08-12 VITALS — BP 144/88 | HR 66 | Temp 97.9°F | Resp 17 | Ht 68.0 in | Wt 154.7 lb

## 2023-08-12 DIAGNOSIS — R001 Bradycardia, unspecified: Secondary | ICD-10-CM | POA: Insufficient documentation

## 2023-08-12 DIAGNOSIS — Z01818 Encounter for other preprocedural examination: Secondary | ICD-10-CM | POA: Insufficient documentation

## 2023-08-12 DIAGNOSIS — M1612 Unilateral primary osteoarthritis, left hip: Secondary | ICD-10-CM | POA: Insufficient documentation

## 2023-08-12 HISTORY — DX: Unspecified osteoarthritis, unspecified site: M19.90

## 2023-08-12 HISTORY — DX: Iron deficiency: E61.1

## 2023-08-12 LAB — COMPREHENSIVE METABOLIC PANEL WITH GFR
ALT: 36 U/L (ref 0–44)
AST: 29 U/L (ref 15–41)
Albumin: 4 g/dL (ref 3.5–5.0)
Alkaline Phosphatase: 57 U/L (ref 38–126)
Anion gap: 9 (ref 5–15)
BUN: 12 mg/dL (ref 8–23)
CO2: 30 mmol/L (ref 22–32)
Calcium: 9.3 mg/dL (ref 8.9–10.3)
Chloride: 98 mmol/L (ref 98–111)
Creatinine, Ser: 1.1 mg/dL (ref 0.61–1.24)
GFR, Estimated: >60 mL/min (ref >60)
Glucose, Bld: 119 mg/dL — ABNORMAL HIGH (ref 70–99)
Potassium: 4.4 mmol/L (ref 3.5–5.1)
Sodium: 137 mmol/L (ref 135–145)
Total Bilirubin: 0.8 mg/dL (ref 0.0–1.2)
Total Protein: 7.1 g/dL (ref 6.5–8.1)

## 2023-08-12 LAB — TYPE AND SCREEN
ABO/RH(D): A POS
Antibody Screen: NEGATIVE

## 2023-08-12 LAB — CBC
HCT: 41.9 % (ref 39.0–52.0)
Hemoglobin: 14.1 g/dL (ref 13.0–17.0)
MCH: 31.3 pg (ref 26.0–34.0)
MCHC: 33.7 g/dL (ref 30.0–36.0)
MCV: 93.1 fL (ref 80.0–100.0)
Platelets: 195 10*3/uL (ref 150–400)
RBC: 4.5 MIL/uL (ref 4.22–5.81)
RDW: 12.2 % (ref 11.5–15.5)
WBC: 6.6 10*3/uL (ref 4.0–10.5)
nRBC: 0 % (ref 0.0–0.2)

## 2023-08-12 LAB — SURGICAL PCR SCREEN
MRSA, PCR: NEGATIVE
Staphylococcus aureus: POSITIVE — AB

## 2023-08-12 NOTE — Progress Notes (Signed)
 Per Eunice Blase at Dr. Warren Danes office, patient does not have to stop Aspirin 81 mg.  Patient was made aware of these instructions prior to leaving his PAT appointment and verbalized understanding.

## 2023-08-12 NOTE — Progress Notes (Signed)
 PCP - denies Cardiologist -denies Oncologist -  Terry Vargas  PPM/ICD - denies   Chest x-ray - denies EKG - 08/12/23 Stress Test - denies ECHO - denies Cardiac Cath - denies  Sleep Study - denies  No DM  Last dose of GLP1 agonist-  n/a GLP1 instructions: n/a  Blood Thinner Instructions:  Last dose of Xarelto - 3/27 - patient is aware  Aspirin Instructions:  Patient has not received instructions on when to stop Aspirin.  Office was notified while patient was PAT appointment.  Patient states that currently his last dose was 3/26.   ERAS Protcol - clears until 0530 PRE-SURGERY Ensure or G2-  Ensure as ordered  COVID TEST- n/a   Anesthesia review:  no  Patient denies shortness of breath, fever, cough and chest pain at PAT appointment   All instructions explained to the patient, with a verbal understanding of the material. Patient agrees to go over the instructions while at home for a better understanding. Patient also instructed to self quarantine after being tested for COVID-19. The opportunity to ask questions was provided.

## 2023-08-13 MED ORDER — TRANEXAMIC ACID 1000 MG/10ML IV SOLN
2000.0000 mg | INTRAVENOUS | Status: AC
  Administered 2023-08-16: 1000 mg via TOPICAL
  Filled 2023-08-13: qty 20

## 2023-08-15 NOTE — Anesthesia Preprocedure Evaluation (Signed)
 Anesthesia Evaluation  Patient identified by MRN, date of birth, ID band Patient awake    Reviewed: Allergy & Precautions, H&P , NPO status , Patient's Chart, lab work & pertinent test results  History of Anesthesia Complications Negative for: history of anesthetic complications  Airway Mallampati: II  TM Distance: >3 FB Neck ROM: Full    Dental no notable dental hx. (+) Dental Advisory Given   Pulmonary neg pulmonary ROS   Pulmonary exam normal breath sounds clear to auscultation       Cardiovascular Exercise Tolerance: Good hypertension, Pt. on medications + Peripheral Vascular Disease and + DVT (INR 1.4)  + Valvular Problems/Murmurs  Rhythm:Regular Rate:Normal + Systolic murmurs    Neuro/Psych negative neurological ROS  negative psych ROS   GI/Hepatic Neg liver ROS,,,Colon cancer: surgery, XRT, chemo   Endo/Other  negative endocrine ROS    Renal/GU negative Renal ROS     Musculoskeletal  (+) Arthritis ,    Abdominal   Peds  Hematology negative hematology ROS (+)   Anesthesia Other Findings   Reproductive/Obstetrics                             Anesthesia Physical Anesthesia Plan  ASA: 3  Anesthesia Plan: Spinal   Post-op Pain Management: Tylenol PO (pre-op)*, Celebrex PO (pre-op)* and Gabapentin PO (pre-op)*   Induction: Intravenous  PONV Risk Score and Plan: 2 and Ondansetron, Dexamethasone and Treatment may vary due to age or medical condition  Airway Management Planned: Natural Airway  Additional Equipment:   Intra-op Plan:   Post-operative Plan:   Informed Consent: I have reviewed the patients History and Physical, chart, labs and discussed the procedure including the risks, benefits and alternatives for the proposed anesthesia with the patient or authorized representative who has indicated his/her understanding and acceptance.     Dental advisory given  Plan  Discussed with: CRNA  Anesthesia Plan Comments: (Risks of spinal explained at length. This includes, but is not limited to, bleeding, infection, reactions to the medications, seizures, damage to surrounding structures, damage to nerves, paralysis, permanent weakness, numbness, tingling and pain. All patient questions were answered and patient wishes to proceed with spinal. )        Anesthesia Quick Evaluation

## 2023-08-16 ENCOUNTER — Encounter (HOSPITAL_COMMUNITY): Payer: Self-pay | Admitting: Orthopaedic Surgery

## 2023-08-16 ENCOUNTER — Ambulatory Visit (HOSPITAL_COMMUNITY): Payer: Self-pay | Admitting: Anesthesiology

## 2023-08-16 ENCOUNTER — Ambulatory Visit (HOSPITAL_BASED_OUTPATIENT_CLINIC_OR_DEPARTMENT_OTHER): Payer: Self-pay | Admitting: Anesthesiology

## 2023-08-16 ENCOUNTER — Other Ambulatory Visit: Payer: Self-pay

## 2023-08-16 ENCOUNTER — Encounter (HOSPITAL_COMMUNITY)
Admission: RE | Disposition: A | Discharge status: Home / Self Care | Payer: Self-pay | Source: Home / Self Care | Attending: Orthopaedic Surgery

## 2023-08-16 ENCOUNTER — Observation Stay (HOSPITAL_COMMUNITY)
Admission: RE | Admit: 2023-08-16 | Discharge: 2023-08-17 | Disposition: A | Discharge status: Home / Self Care | Payer: Commercial Managed Care - HMO | Attending: Orthopaedic Surgery | Admitting: Orthopaedic Surgery

## 2023-08-16 ENCOUNTER — Observation Stay (HOSPITAL_COMMUNITY)

## 2023-08-16 ENCOUNTER — Ambulatory Visit (HOSPITAL_COMMUNITY)

## 2023-08-16 DIAGNOSIS — Z79899 Other long term (current) drug therapy: Secondary | ICD-10-CM | POA: Insufficient documentation

## 2023-08-16 DIAGNOSIS — Z7901 Long term (current) use of anticoagulants: Secondary | ICD-10-CM | POA: Diagnosis not present

## 2023-08-16 DIAGNOSIS — Z7982 Long term (current) use of aspirin: Secondary | ICD-10-CM | POA: Diagnosis not present

## 2023-08-16 DIAGNOSIS — M1612 Unilateral primary osteoarthritis, left hip: Secondary | ICD-10-CM | POA: Diagnosis not present

## 2023-08-16 DIAGNOSIS — Z85038 Personal history of other malignant neoplasm of large intestine: Secondary | ICD-10-CM | POA: Insufficient documentation

## 2023-08-16 DIAGNOSIS — I10 Essential (primary) hypertension: Secondary | ICD-10-CM | POA: Diagnosis not present

## 2023-08-16 DIAGNOSIS — Z86718 Personal history of other venous thrombosis and embolism: Secondary | ICD-10-CM | POA: Insufficient documentation

## 2023-08-16 DIAGNOSIS — Z85828 Personal history of other malignant neoplasm of skin: Secondary | ICD-10-CM | POA: Insufficient documentation

## 2023-08-16 DIAGNOSIS — Z96642 Presence of left artificial hip joint: Secondary | ICD-10-CM

## 2023-08-16 HISTORY — PX: TOTAL HIP ARTHROPLASTY: SHX124

## 2023-08-16 HISTORY — DX: Presence of left artificial hip joint: Z96.642

## 2023-08-16 SURGERY — ARTHROPLASTY, HIP, TOTAL, ANTERIOR APPROACH
Anesthesia: Spinal | Site: Hip | Laterality: Left

## 2023-08-16 MED ORDER — DEXAMETHASONE SODIUM PHOSPHATE 10 MG/ML IJ SOLN
10.0000 mg | Freq: Once | INTRAMUSCULAR | Status: AC
  Administered 2023-08-17: 10 mg via INTRAVENOUS
  Filled 2023-08-16: qty 1

## 2023-08-16 MED ORDER — ALUM & MAG HYDROXIDE-SIMETH 200-200-20 MG/5ML PO SUSP: 30.0000 mL | ORAL | Status: DC | PRN

## 2023-08-16 MED ORDER — CHLORHEXIDINE GLUCONATE 4 % EX SOLN: 1.0000 | CUTANEOUS | 1 refills | Status: DC

## 2023-08-16 MED ORDER — DOCUSATE SODIUM 100 MG PO CAPS
100.0000 mg | ORAL_CAPSULE | Freq: Two times a day (BID) | ORAL | Status: DC
  Administered 2023-08-16 – 2023-08-17 (×3): 100 mg via ORAL
  Filled 2023-08-16 (×3): qty 1

## 2023-08-16 MED ORDER — MIDAZOLAM HCL 2 MG/2ML IJ SOLN
INTRAMUSCULAR | Status: AC
  Filled 2023-08-16: qty 2

## 2023-08-16 MED ORDER — ACETAMINOPHEN 500 MG PO TABS
1000.0000 mg | ORAL_TABLET | Freq: Four times a day (QID) | ORAL | Status: AC
  Administered 2023-08-16 – 2023-08-17 (×4): 1000 mg via ORAL
  Filled 2023-08-16 (×4): qty 2

## 2023-08-16 MED ORDER — LACTATED RINGERS IV SOLN
INTRAVENOUS | Status: DC
Start: 2023-08-16 — End: 2023-08-16

## 2023-08-16 MED ORDER — CELECOXIB 200 MG PO CAPS
ORAL_CAPSULE | ORAL | Status: AC
Start: 2023-08-16 — End: 2023-08-16
  Administered 2023-08-16: 200 mg via ORAL
  Filled 2023-08-16: qty 1

## 2023-08-16 MED ORDER — CEFAZOLIN SODIUM-DEXTROSE 2-4 GM/100ML-% IV SOLN
INTRAVENOUS | Status: AC
  Filled 2023-08-16: qty 100

## 2023-08-16 MED ORDER — HYDROMORPHONE HCL 1 MG/ML IJ SOLN: 0.2500 mg | INTRAMUSCULAR | Status: DC | PRN

## 2023-08-16 MED ORDER — LIDOCAINE 2% (20 MG/ML) 5 ML SYRINGE
INTRAMUSCULAR | Status: DC | PRN
Start: 2023-08-16 — End: 2023-08-16
  Administered 2023-08-16: 40 mg via INTRAVENOUS

## 2023-08-16 MED ORDER — METHOCARBAMOL 1000 MG/10ML IJ SOLN: 500.0000 mg | Freq: Four times a day (QID) | INTRAMUSCULAR | Status: DC | PRN

## 2023-08-16 MED ORDER — TRANEXAMIC ACID-NACL 1000-0.7 MG/100ML-% IV SOLN
1000.0000 mg | Freq: Once | INTRAVENOUS | Status: AC
  Administered 2023-08-16: 1000 mg via INTRAVENOUS
  Filled 2023-08-16: qty 100

## 2023-08-16 MED ORDER — PANTOPRAZOLE SODIUM 40 MG PO TBEC
40.0000 mg | DELAYED_RELEASE_TABLET | Freq: Every day | ORAL | Status: DC
  Administered 2023-08-16 – 2023-08-17 (×2): 40 mg via ORAL
  Filled 2023-08-16 (×2): qty 1

## 2023-08-16 MED ORDER — SODIUM CHLORIDE 0.9 % IR SOLN
Status: DC | PRN
  Administered 2023-08-16: 1000 mL

## 2023-08-16 MED ORDER — CELECOXIB 200 MG PO CAPS: 200.0000 mg | ORAL_CAPSULE | Freq: Once | ORAL | Status: AC

## 2023-08-16 MED ORDER — SENNA 8.6 MG PO TABS
1.0000 | ORAL_TABLET | Freq: Two times a day (BID) | ORAL | Status: DC | PRN
  Administered 2023-08-16: 8.6 mg via ORAL
  Filled 2023-08-16: qty 1

## 2023-08-16 MED ORDER — METOCLOPRAMIDE HCL 5 MG PO TABS: 5.0000 mg | ORAL_TABLET | Freq: Three times a day (TID) | ORAL | Status: DC | PRN

## 2023-08-16 MED ORDER — OXYCODONE HCL 5 MG PO TABS
10.0000 mg | ORAL_TABLET | ORAL | Status: DC | PRN
  Administered 2023-08-17: 10 mg via ORAL

## 2023-08-16 MED ORDER — OXYCODONE HCL 5 MG PO TABS
5.0000 mg | ORAL_TABLET | ORAL | Status: DC | PRN
  Administered 2023-08-16: 5 mg via ORAL
  Administered 2023-08-17: 10 mg via ORAL
  Filled 2023-08-16: qty 1
  Filled 2023-08-16 (×2): qty 2

## 2023-08-16 MED ORDER — 0.9 % SODIUM CHLORIDE (POUR BTL) OPTIME
TOPICAL | Status: DC | PRN
  Administered 2023-08-16: 1000 mL

## 2023-08-16 MED ORDER — PHENYLEPHRINE HCL-NACL 20-0.9 MG/250ML-% IV SOLN
INTRAVENOUS | Status: DC | PRN
  Administered 2023-08-16: 20 ug/min via INTRAVENOUS

## 2023-08-16 MED ORDER — GABAPENTIN 300 MG PO CAPS
ORAL_CAPSULE | ORAL | Status: AC
  Administered 2023-08-16: 300 mg via ORAL
  Filled 2023-08-16: qty 1

## 2023-08-16 MED ORDER — BUPIVACAINE IN DEXTROSE 0.75-8.25 % IT SOLN
INTRATHECAL | Status: DC | PRN
  Administered 2023-08-16: 1.8 mL via INTRATHECAL

## 2023-08-16 MED ORDER — FENTANYL CITRATE (PF) 250 MCG/5ML IJ SOLN
INTRAMUSCULAR | Status: AC
  Filled 2023-08-16: qty 5

## 2023-08-16 MED ORDER — OXYCODONE HCL ER 10 MG PO T12A
10.0000 mg | EXTENDED_RELEASE_TABLET | Freq: Two times a day (BID) | ORAL | Status: DC
  Administered 2023-08-16 – 2023-08-17 (×3): 10 mg via ORAL
  Filled 2023-08-16 (×3): qty 1

## 2023-08-16 MED ORDER — RIVAROXABAN 10 MG PO TABS
20.0000 mg | ORAL_TABLET | Freq: Every day | ORAL | Status: DC
  Administered 2023-08-17: 20 mg via ORAL
  Filled 2023-08-16: qty 2

## 2023-08-16 MED ORDER — CEFAZOLIN SODIUM-DEXTROSE 2-4 GM/100ML-% IV SOLN
2.0000 g | Freq: Four times a day (QID) | INTRAVENOUS | Status: AC
  Administered 2023-08-16 (×2): 2 g via INTRAVENOUS
  Filled 2023-08-16 (×2): qty 100

## 2023-08-16 MED ORDER — DROPERIDOL 2.5 MG/ML IJ SOLN: 0.6250 mg | Freq: Once | INTRAMUSCULAR | Status: DC | PRN

## 2023-08-16 MED ORDER — MENTHOL 3 MG MT LOZG: 1.0000 | LOZENGE | OROMUCOSAL | Status: DC | PRN

## 2023-08-16 MED ORDER — METOCLOPRAMIDE HCL 5 MG/ML IJ SOLN: 5.0000 mg | Freq: Three times a day (TID) | INTRAMUSCULAR | Status: DC | PRN

## 2023-08-16 MED ORDER — PRONTOSAN WOUND IRRIGATION OPTIME
TOPICAL | Status: DC | PRN
Start: 2023-08-16 — End: 2023-08-16
  Administered 2023-08-16: 1000 mL via TOPICAL

## 2023-08-16 MED ORDER — BUPIVACAINE-MELOXICAM ER 400-12 MG/14ML IJ SOLN
INTRAMUSCULAR | Status: AC
  Filled 2023-08-16: qty 1

## 2023-08-16 MED ORDER — MIDAZOLAM HCL 2 MG/2ML IJ SOLN
INTRAMUSCULAR | Status: DC | PRN
  Administered 2023-08-16: 2 mg via INTRAVENOUS

## 2023-08-16 MED ORDER — DIPHENHYDRAMINE HCL 12.5 MG/5ML PO ELIX: 25.0000 mg | ORAL_SOLUTION | ORAL | Status: DC | PRN

## 2023-08-16 MED ORDER — MAGNESIUM CITRATE PO SOLN: 1.0000 | Freq: Once | ORAL | Status: DC | PRN

## 2023-08-16 MED ORDER — CHLORHEXIDINE GLUCONATE 0.12 % MT SOLN: 15.0000 mL | Freq: Once | OROMUCOSAL | Status: AC

## 2023-08-16 MED ORDER — ONDANSETRON HCL 4 MG PO TABS: 4.0000 mg | ORAL_TABLET | Freq: Four times a day (QID) | ORAL | Status: DC | PRN

## 2023-08-16 MED ORDER — OXYCODONE HCL 5 MG/5ML PO SOLN: 5.0000 mg | Freq: Once | ORAL | Status: DC | PRN

## 2023-08-16 MED ORDER — TRANEXAMIC ACID-NACL 1000-0.7 MG/100ML-% IV SOLN
1000.0000 mg | INTRAVENOUS | Status: DC
  Filled 2023-08-16: qty 100

## 2023-08-16 MED ORDER — ONDANSETRON HCL 4 MG/2ML IJ SOLN: 4.0000 mg | Freq: Four times a day (QID) | INTRAMUSCULAR | Status: DC | PRN

## 2023-08-16 MED ORDER — PHENOL 1.4 % MT LIQD: 1.0000 | OROMUCOSAL | Status: DC | PRN

## 2023-08-16 MED ORDER — HYDROMORPHONE HCL 1 MG/ML IJ SOLN: 0.5000 mg | INTRAMUSCULAR | Status: DC | PRN

## 2023-08-16 MED ORDER — POLYETHYLENE GLYCOL 3350 17 G PO PACK
17.0000 g | PACK | Freq: Every day | ORAL | Status: DC
  Filled 2023-08-16: qty 1

## 2023-08-16 MED ORDER — TRANEXAMIC ACID 1000 MG/10ML IV SOLN
INTRAVENOUS | Status: DC | PRN
  Administered 2023-08-16: 2000 mg via TOPICAL

## 2023-08-16 MED ORDER — ORAL CARE MOUTH RINSE: 15.0000 mL | Freq: Once | OROMUCOSAL | Status: AC

## 2023-08-16 MED ORDER — METHOCARBAMOL 500 MG PO TABS
500.0000 mg | ORAL_TABLET | Freq: Four times a day (QID) | ORAL | Status: DC | PRN
  Administered 2023-08-16 – 2023-08-17 (×4): 500 mg via ORAL
  Filled 2023-08-16 (×4): qty 1

## 2023-08-16 MED ORDER — POVIDONE-IODINE 10 % EX SWAB
2.0000 | Freq: Once | CUTANEOUS | Status: AC
  Administered 2023-08-16: 2 via TOPICAL

## 2023-08-16 MED ORDER — PROPOFOL 500 MG/50ML IV EMUL
INTRAVENOUS | Status: DC | PRN
  Administered 2023-08-16: 110 ug/kg/min via INTRAVENOUS

## 2023-08-16 MED ORDER — PROPOFOL 10 MG/ML IV BOLUS
INTRAVENOUS | Status: DC | PRN
  Administered 2023-08-16 (×2): 20 mg via INTRAVENOUS
  Administered 2023-08-16: 10 mg via INTRAVENOUS

## 2023-08-16 MED ORDER — VANCOMYCIN HCL 1 G IV SOLR
INTRAVENOUS | Status: DC | PRN
  Administered 2023-08-16: 1000 mg via TOPICAL

## 2023-08-16 MED ORDER — ACETAMINOPHEN 500 MG PO TABS: 1000.0000 mg | ORAL_TABLET | Freq: Once | ORAL | Status: AC

## 2023-08-16 MED ORDER — OXYCODONE HCL 5 MG PO TABS: 5.0000 mg | ORAL_TABLET | Freq: Once | ORAL | Status: DC | PRN

## 2023-08-16 MED ORDER — GABAPENTIN 300 MG PO CAPS: 300.0000 mg | ORAL_CAPSULE | Freq: Once | ORAL | Status: AC

## 2023-08-16 MED ORDER — ACETAMINOPHEN 325 MG PO TABS: 325.0000 mg | ORAL_TABLET | Freq: Four times a day (QID) | ORAL | Status: DC | PRN

## 2023-08-16 MED ORDER — ACETAMINOPHEN 500 MG PO TABS
ORAL_TABLET | ORAL | Status: AC
Start: 2023-08-16 — End: 2023-08-16
  Administered 2023-08-16: 1000 mg via ORAL
  Filled 2023-08-16: qty 2

## 2023-08-16 MED ORDER — CHLORHEXIDINE GLUCONATE 0.12 % MT SOLN
OROMUCOSAL | Status: AC
  Administered 2023-08-16: 15 mL via OROMUCOSAL
  Filled 2023-08-16: qty 15

## 2023-08-16 MED ORDER — BUPIVACAINE-MELOXICAM ER 400-12 MG/14ML IJ SOLN
INTRAMUSCULAR | Status: DC | PRN
  Administered 2023-08-16: 400 mg

## 2023-08-16 MED ORDER — VANCOMYCIN HCL 1000 MG IV SOLR
INTRAVENOUS | Status: AC
  Filled 2023-08-16: qty 20

## 2023-08-16 MED ORDER — FENTANYL CITRATE (PF) 250 MCG/5ML IJ SOLN
INTRAMUSCULAR | Status: DC | PRN
  Administered 2023-08-16: 25 ug via INTRAVENOUS
  Administered 2023-08-16: 75 ug via INTRAVENOUS

## 2023-08-16 MED ORDER — MUPIROCIN 2 % EX OINT: 1.0000 | TOPICAL_OINTMENT | Freq: Two times a day (BID) | CUTANEOUS | 0 refills | Status: AC

## 2023-08-16 MED ORDER — LIDOCAINE 2% (20 MG/ML) 5 ML SYRINGE
INTRAMUSCULAR | Status: AC
  Filled 2023-08-16: qty 5

## 2023-08-16 MED ORDER — DEXAMETHASONE SODIUM PHOSPHATE 10 MG/ML IJ SOLN
INTRAMUSCULAR | Status: DC | PRN
  Administered 2023-08-16: 10 mg via INTRAVENOUS

## 2023-08-16 MED ORDER — CEFAZOLIN SODIUM-DEXTROSE 2-4 GM/100ML-% IV SOLN
2.0000 g | INTRAVENOUS | Status: AC
  Administered 2023-08-16: 2 g via INTRAVENOUS

## 2023-08-16 MED ORDER — SORBITOL 70 % SOLN: 30.0000 mL | Freq: Every day | Status: DC | PRN

## 2023-08-16 MED ORDER — ONDANSETRON HCL 4 MG/2ML IJ SOLN
INTRAMUSCULAR | Status: DC | PRN
  Administered 2023-08-16: 4 mg via INTRAVENOUS

## 2023-08-16 SURGICAL SUPPLY — 53 items
ACETAB CUP W/GRIPTION 54 (Plate) ×1 IMPLANT
BAG COUNTER SPONGE SURGICOUNT (BAG) ×2 IMPLANT
BAG DECANTER FOR FLEXI CONT (MISCELLANEOUS) ×2 IMPLANT
BLADE SAG 18X100X1.27 (BLADE) ×2 IMPLANT
COVER PERINEAL POST (MISCELLANEOUS) ×2 IMPLANT
COVER SURGICAL LIGHT HANDLE (MISCELLANEOUS) ×2 IMPLANT
CUP ACETAB W/GRIPTION 54 (Plate) IMPLANT
DERMABOND ADVANCED .7 DNX12 (GAUZE/BANDAGES/DRESSINGS) IMPLANT
DRAPE C-ARM 42X72 X-RAY (DRAPES) ×2 IMPLANT
DRAPE POUCH INSTRU U-SHP 10X18 (DRAPES) ×2 IMPLANT
DRAPE STERI IOBAN 125X83 (DRAPES) ×2 IMPLANT
DRAPE U-SHAPE 47X51 STRL (DRAPES) ×4 IMPLANT
DRSG AQUACEL AG ADV 3.5X10 (GAUZE/BANDAGES/DRESSINGS) ×2 IMPLANT
DURAPREP 26ML APPLICATOR (WOUND CARE) ×4 IMPLANT
ELECT BLADE 4.0 EZ CLEAN MEGAD (MISCELLANEOUS) ×1 IMPLANT
ELECT REM PT RETURN 9FT ADLT (ELECTROSURGICAL) ×1 IMPLANT
ELECTRODE BLDE 4.0 EZ CLN MEGD (MISCELLANEOUS) ×2 IMPLANT
ELECTRODE REM PT RTRN 9FT ADLT (ELECTROSURGICAL) ×2 IMPLANT
GLOVE BIOGEL PI IND STRL 7.0 (GLOVE) ×4 IMPLANT
GLOVE BIOGEL PI IND STRL 7.5 (GLOVE) ×10 IMPLANT
GLOVE ECLIPSE 7.0 STRL STRAW (GLOVE) ×4 IMPLANT
GLOVE SKINSENSE STRL SZ7.5 (GLOVE) ×2 IMPLANT
GLOVE SURG SYN 7.5 E (GLOVE) ×2 IMPLANT
GLOVE SURG SYN 7.5 PF PI (GLOVE) ×4 IMPLANT
GLOVE SURG UNDER POLY LF SZ7 (GLOVE) ×6 IMPLANT
GLOVE SURG UNDER POLY LF SZ7.5 (GLOVE) ×4 IMPLANT
GOWN STRL REUS W/ TWL LRG LVL3 (GOWN DISPOSABLE) IMPLANT
GOWN STRL REUS W/ TWL XL LVL3 (GOWN DISPOSABLE) ×2 IMPLANT
GOWN STRL SURGICAL XL XLNG (GOWN DISPOSABLE) ×2 IMPLANT
GOWN TOGA ZIPPER T7+ PEEL AWAY (MISCELLANEOUS) ×2 IMPLANT
HEAD CERAMIC 36 PLUS5 (Hips) IMPLANT
HOOD PEEL AWAY T7 (MISCELLANEOUS) ×2 IMPLANT
IV NS IRRIG 3000ML ARTHROMATIC (IV SOLUTION) ×2 IMPLANT
KIT BASIN OR (CUSTOM PROCEDURE TRAY) ×2 IMPLANT
LINER NEUTRAL 54X36MM PLUS 4 (Hips) IMPLANT
MARKER SKIN DUAL TIP RULER LAB (MISCELLANEOUS) ×2 IMPLANT
NDL SPNL 18GX3.5 QUINCKE PK (NEEDLE) ×2 IMPLANT
NEEDLE SPNL 18GX3.5 QUINCKE PK (NEEDLE) ×1 IMPLANT
PACK TOTAL JOINT (CUSTOM PROCEDURE TRAY) ×2 IMPLANT
PACK UNIVERSAL I (CUSTOM PROCEDURE TRAY) ×2 IMPLANT
SET HNDPC FAN SPRY TIP SCT (DISPOSABLE) ×2 IMPLANT
SOLUTION PRONTOSAN WOUND 350ML (IRRIGATION / IRRIGATOR) ×2 IMPLANT
STEM FEM ACTIS HIGH SZ3 (Stem) IMPLANT
SUT ETHIBOND 2 V 37 (SUTURE) ×2 IMPLANT
SUT VIC AB 0 CT1 27XBRD ANBCTR (SUTURE) ×2 IMPLANT
SUT VIC AB 1 CTX36XBRD ANBCTR (SUTURE) ×2 IMPLANT
SUT VIC AB 2-0 CT1 TAPERPNT 27 (SUTURE) ×4 IMPLANT
SYR 50ML LL SCALE MARK (SYRINGE) ×2 IMPLANT
TOWEL GREEN STERILE (TOWEL DISPOSABLE) ×2 IMPLANT
TRAY CATH INTERMITTENT SS 16FR (CATHETERS) IMPLANT
TRAY FOLEY W/BAG SLVR 16FR ST (SET/KITS/TRAYS/PACK) IMPLANT
TUBE SUCT ARGYLE STRL (TUBING) ×2 IMPLANT
YANKAUER SUCT BULB TIP NO VENT (SUCTIONS) ×2 IMPLANT

## 2023-08-16 NOTE — Anesthesia Postprocedure Evaluation (Signed)
 Anesthesia Post Note  Patient: Terry Vargas  Procedure(s) Performed: LEFT TOTAL HIP ARTHROPLASTY ANTERIOR APPROACH (Left: Hip)     Patient location during evaluation: PACU Anesthesia Type: Spinal Level of consciousness: awake and alert Pain management: pain level controlled Vital Signs Assessment: post-procedure vital signs reviewed and stable Respiratory status: spontaneous breathing Cardiovascular status: stable Anesthetic complications: no   No notable events documented.  Last Vitals:  Vitals:   08/16/23 1215 08/16/23 1234  BP: 124/73 137/87  Pulse: (!) 55 (!) 55  Resp: 16 20  Temp: 36.7 C 36.9 C  SpO2: 99% 98%    Last Pain:  Vitals:   08/16/23 1234  TempSrc: Oral  PainSc:                  Lewie Loron

## 2023-08-16 NOTE — Discharge Instructions (Signed)

## 2023-08-16 NOTE — H&P (Signed)
 PREOPERATIVE H&P  Chief Complaint: left hip osteoarthritis  HPI: Terry Vargas is a 63 y.o. male who presents for surgical treatment of left hip osteoarthritis.  He denies any changes in medical history.  Past Surgical History:  Procedure Laterality Date  .  pot-a-cath insertion    . COLON SURGERY    . COLONOSCOPY    . EUS N/A 01/12/2013   Procedure: UPPER ENDOSCOPIC ULTRASOUND (EUS) LINEAR;  Surgeon: Rachael Fee, MD;  Location: [facility];  Service: Endoscopy;  Laterality: N/A;  celiac plexus   . gastrojejunostomy    . 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 open right colectomy  11/25/2010   hemicolectomy w/appendectomy  . POLYPECTOMY    . PORT-A-CATH REMOVAL Left 08/04/2012   Procedure: REMOVAL PORT-A-CATH;  Surgeon: Emelia Loron, MD;  Location: [facility] OR;  Service: General;  Laterality: Left;  . PORT-A-CATH REMOVAL  2014  . PORTACATH PLACEMENT Left 08/15/2012   Procedure: INSERTION PORT-A-CATH;  Surgeon: Emelia Loron, MD;  Location: [facility];  Service: General;  Laterality: Left;  . ROTATOR CUFF REPAIR Right   . SKIN CANCER EXCISION    . TONSILLECTOMY     as child   Social History   Socioeconomic History  . Marital status: Divorced    Spouse name: Not on file  . Number of children: Not on file  . Years of education: Not on file  . Highest education level: Not on file  Occupational History  . Not on file  Tobacco Use  . Smoking status: Never  . Smokeless tobacco: Never  Vaping Use  . Vaping status: Never Used  Substance and Sexual Activity  . Alcohol use: Yes    Alcohol/week: 2.0 - 4.0 standard drinks of alcohol    Types: 1 - 2 Cans of beer, 1 - 2 Shots of liquor per week    Comment: 1-2 drinks daily  . Drug use: No  . Sexual activity: Not on file  Other Topics Concern  . Not on file  Social History Narrative  . Not on file   Social Drivers of Health   Financial Resource Strain: Not on file  Food Insecurity:  Not on file  Transportation Needs: Not on file  Physical Activity: Not on file  Stress: Not on file  Social Connections: Not on file   Family History  Problem Relation Age of Onset  . Kidney cancer Father   . Colon cancer Father        65, 69  . Skin cancer Father        squamous cell-forehead, nose  . Colon cancer Paternal Grandmother   . Prostate cancer Paternal Grandfather   . Colon cancer Cousin 35       paternal  . Colon cancer Cousin 80       paternal  . Colon cancer Paternal Aunt   . Colon polyps Neg Hx   . Esophageal cancer Neg Hx   . Rectal cancer Neg Hx   . Stomach cancer Neg Hx    No Known Allergies Prior to Admission medications   Medication Sig Start Date End Date Taking? Authorizing Provider  acetaminophen (TYLENOL) 500 MG tablet Take 500-1,000 mg by mouth every 6 (six) hours as needed for pain.   Yes [provider]  aspirin EC 81 MG tablet Take 81 mg by mouth in the morning. Swallow whole.   Yes [provider]  diclofenac sodium (VOLTAREN) 1 %  GEL Apply 2 g topically 4 (four) times daily. Patient taking differently: Apply 2 g topically 4 (four) times daily as needed (pain.). 02/25/16  Yes Draper, Marcial Pacas R, DO  fluorouracil (EFUDEX) 5 % cream Apply 1 Application topically 2 (two) times daily as needed (as directed by dermatologist for skin spots). 03/22/19  Yes [provider]  HYDROcodone-acetaminophen (NORCO/VICODIN) 5-325 MG tablet Take 1 tablet by mouth 2 (two) times daily as needed for moderate pain (pain score 4-6). Patient taking differently: Take 1 tablet by mouth at bedtime. 07/22/23  Yes Stanbery, Mary L, PA-C  KRILL OIL OMEGA-3 PO Take 1 capsule by mouth daily in the afternoon.   Yes [provider]  loperamide (IMODIUM) 2 MG capsule Take 2 mg by mouth in the morning.   Yes [provider]  Misc Natural Products (OSTEO BI-FLEX ADV TRIPLE ST PO) Take 2 tablets by mouth in the morning.   Yes [provider]  Multiple Vitamin (MULTIVITAMIN WITH MINERALS) TABS tablet Take 1 tablet by mouth daily in the afternoon.   Yes [provider]  traMADol (ULTRAM) 50 MG tablet Take 1-2 tablets (50-100 mg total) by mouth daily as needed. Patient taking differently: Take 50 mg by mouth at bedtime. 07/23/23  Yes Cristie Hem, PA-C  TURMERIC PO Take 2 capsules by mouth daily in the afternoon.   Yes [provider]  VITAMIN D PO Take 1 tablet by mouth in the morning.   Yes [provider]  XARELTO 20 MG TABS tablet TAKE 1 TABLET(20 MG) BY MOUTH DAILY WITH SUPPER Patient taking differently: Take 20 mg by mouth daily after breakfast. 10/17/19  Yes Ladene Artist, MD  docusate sodium (COLACE) 100 MG capsule Take 1 capsule (100 mg total) by mouth daily as needed. 08/03/23 08/02/24  Cristie Hem, PA-C  lidocaine-prilocaine (EMLA) cream APPLY TOPICALLY TO PAC SITE ONE HOUR PRIOR TO INJECTION AS NEEDED 01/16/14   Ladene Artist, MD  methocarbamol (ROBAXIN-750) 750 MG tablet Take 1 tablet (750 mg total) by mouth 3 (three) times daily as needed for muscle spasms. 08/03/23   Cristie Hem, PA-C  ondansetron (ZOFRAN) 4 MG tablet Take 1 tablet (4 mg total) by mouth every 8 (eight) hours as needed for nausea or vomiting. 08/03/23   Cristie Hem, PA-C  oxyCODONE-acetaminophen (PERCOCET) 5-325 MG tablet Take 1-2 tablets by mouth every 6 (six) hours as needed. To be taken after surgery 08/03/23   Cristie Hem, PA-C     Positive ROS: All other systems have been reviewed and were otherwise negative with the exception of those mentioned in the HPI and as above.  Physical Exam: General: Alert, no acute distress Cardiovascular: No pedal edema Respiratory: No cyanosis, no use of accessory musculature GI: abdomen soft Skin: No lesions in the area of chief complaint Neurologic: Sensation intact distally Psychiatric: Patient is competent for consent with normal mood and  affect Lymphatic: no lymphedema  MUSCULOSKELETAL: exam stable  Assessment: left hip osteoarthritis  Plan: Plan for Procedure(s): LEFT TOTAL HIP ARTHROPLASTY ANTERIOR APPROACH  The risks benefits and alternatives were discussed with the patient including but not limited to the risks of nonoperative treatment, versus surgical intervention including infection, bleeding, nerve injury,  blood clots, cardiopulmonary complications, morbidity, mortality, among others, and they were willing to proceed.   Glee Arvin, MD 08/16/2023 5:51 AM

## 2023-08-16 NOTE — Evaluation (Signed)
 Physical Therapy Evaluation Patient Details Name: Terry Vargas MRN: [id number] DOB: 1960-09-02 Today's Date: 08/16/2023  History of Present Illness  63 y.o. male presents to [facility] hospital on 08/16/2023 for elective L THA. PMH includes colon cancer, DVT, skin cancer.  Clinical Impression  Pt presents to PT with deficits in functional mobility, gait, balance, strength, power, endurance, sensation. Pt is limited by continued effects of spinal block, experiencing continued numbness in L foot and hip. Despite numbness pt is able to ambulate for short household distances with support of RW and assistance of PT. Pt will benefit from frequent mobilization in an effort to restore independence.         If plan is discharge home, recommend the following: A little help with bathing/dressing/bathroom;Assistance with cooking/housework;Assist for transportation;Help with stairs or ramp for entrance   Can travel by private vehicle        Equipment Recommendations Rolling walker (2 wheels) (will confirm tomorrow after mobilizing)  Recommendations for Other Services       Functional Status Assessment Patient has had a recent decline in their functional status and demonstrates the ability to make significant improvements in function in a reasonable and predictable amount of time.     Precautions / Restrictions Precautions Precautions: Fall Recall of Precautions/Restrictions: Intact Precaution/Restrictions Comments: direct anterior THA Restrictions Weight Bearing Restrictions Per Provider Order: Yes LLE Weight Bearing Per Provider Order: Weight bearing as tolerated      Mobility  Bed Mobility Overal bed mobility: Needs Assistance Bed Mobility: Supine to Sit, Sit to Supine     Supine to sit: Supervision, HOB elevated Sit to supine: Supervision, HOB elevated        Transfers Overall transfer level: Needs assistance Equipment used: Rolling walker (2 wheels) Transfers: Sit to/from  Stand Sit to Stand: Contact guard assist                Ambulation/Gait Ambulation/Gait assistance: Min assist Gait Distance (Feet): 50 Feet Assistive device: Rolling walker (2 wheels) Gait Pattern/deviations: Step-to pattern Gait velocity: reduced Gait velocity interpretation: <1.31 ft/sec, indicative of household ambulator   General Gait Details: slowed step-to gait, one instance of mild L knee/hip buckling likely related to sensation impairment, otherwise with good stability  Stairs            Wheelchair Mobility     Tilt Bed    Modified Rankin (Stroke Patients Only)       Balance Overall balance assessment: Needs assistance Sitting-balance support: No upper extremity supported, Feet supported Sitting balance-Leahy Scale: Good     Standing balance support: Bilateral upper extremity supported, Reliant on assistive device for balance Standing balance-Leahy Scale: Poor                               Pertinent Vitals/Pain Pain Assessment Pain Assessment: Faces Faces Pain Scale: Hurts little more Pain Location: L hip Pain Descriptors / Indicators: Burning Pain Intervention(s): Monitored during session    Home Living Family/patient expects to be discharged to:: Private residence Living Arrangements: Alone Available Help at Discharge: Family;Available 24 hours/day Type of Home: House Home Access: Stairs to enter Entrance Stairs-Rails: None Entrance Stairs-Number of Steps: 3   Home Layout: One level Home Equipment: Cane - single point;Adaptive equipment      Prior Function Prior Level of Function : Independent/Modified Independent;Driving;Working/employed             Mobility Comments: works PRN as an Production designer, 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, also volunteers with guest services at [facility]       Extremity/Trunk Assessment   Upper Extremity Assessment Upper Extremity Assessment: Overall [facility] for tasks assessed     Lower Extremity Assessment Lower Extremity Assessment: LLE deficits/detail LLE Deficits / Details: pt reports LLE numbness at hip and foot, ROM WFL, strength grossly 4-/5    Cervical / Trunk Assessment Cervical / Trunk Assessment: Normal  Communication   Communication Communication: No apparent difficulties    Cognition Arousal: Alert Behavior During Therapy: WFL for tasks assessed/performed   PT - Cognitive impairments: No apparent impairments                         Following commands: Intact       Cueing Cueing Techniques: Verbal cues     General Comments General comments (skin integrity, edema, etc.): VSS on RA    Exercises Other Exercises Other Exercises: PT provides education on surgical hip exercise packet   Assessment/Plan    PT Assessment Patient needs continued PT services  PT Problem List Decreased strength;Decreased activity tolerance;Decreased balance;Decreased mobility;Decreased knowledge of use of DME;Impaired sensation;Pain       PT Treatment Interventions DME instruction;Gait training;Stair training;Functional mobility training;Therapeutic activities;Therapeutic exercise;Balance training;Neuromuscular re-education;Patient/family education    PT Goals (Current goals can be found in the Care Plan section)  Acute Rehab PT Goals Patient Stated Goal: to return to independence PT Goal Formulation: With patient Time For Goal Achievement: 08/20/23 Potential to Achieve Goals: Good    Frequency 7X/week     Co-evaluation               AM-PAC PT "6 Clicks" Mobility  Outcome Measure Help needed turning from your back to your side while in a flat bed without using bedrails?: A Little Help needed moving from lying on your back to sitting on the side of a flat bed without using bedrails?: A Little Help needed moving to and from a bed to a chair (including a wheelchair)?: A Little Help needed standing up from a chair using your arms (e.g.,  wheelchair or bedside chair)?: A Little Help needed to walk in hospital room?: A Little Help needed climbing 3-5 steps with a railing? : A Lot 6 Click Score: 17    End of Session Equipment Utilized During Treatment: Gait belt Activity Tolerance: Patient tolerated treatment well Patient left: in bed;with call bell/phone within reach Nurse Communication: Mobility status PT Visit Diagnosis: Other abnormalities of gait and mobility (R26.89);Muscle weakness (generalized) (M62.81)    Time: 1610-9604 PT Time Calculation (min) (ACUTE ONLY): 20 min   Charges:   PT Evaluation $PT Eval Low Complexity: 1 Low   PT General Charges $$ ACUTE PT VISIT: 1 Visit         Arlyss Gandy, PT, DPT Acute Rehabilitation Office [phone]   Arlyss Gandy 08/16/2023, 2:04 PM

## 2023-08-16 NOTE — Op Note (Signed)
 LEFT TOTAL HIP ARTHROPLASTY ANTERIOR APPROACH  Procedure Note AYDRIAN HALPIN   [id number]  Pre-op Diagnosis: left hip osteoarthritis     Post-op Diagnosis: same  Operative Findings CAM deformity Severe OA Leg length discrepancy   Operative Procedures  1. Total hip replacement; Left hip; uncemented [id number]   Surgeon: Gershon Mussel, M.D.  Assist: Oneal Grout, PA-C   Anesthesia: spinal  Prosthesis: Depuy Acetabulum: Pinnacle 54 mm Femur: Actis 3 high offset Head: 36 mm size: +5 Liner: +4 Bearing Type: ceramic/poly  Total Hip Arthroplasty (Anterior Approach) Op Note:  After informed consent was obtained and the operative extremity marked in the holding area, the patient was brought back to the operating room and placed supine on the HANA table. Next, the operative extremity was prepped and draped in normal sterile fashion. Surgical timeout occurred verifying patient identification, surgical site, surgical procedure and administration of antibiotics.  A 10 cm longitudinal incision was made starting from 2 fingerbreadths lateral and inferior to the ASIS towards the lateral aspect of the patella.  A Hueter approach to the hip was performed, using the interval between tensor fascia lata and sartorius.  Dissection was carried bluntly down onto the anterior hip capsule. The lateral femoral circumflex vessels were identified and coagulated. A capsulotomy was performed and the capsular flaps tagged for later repair.  The neck osteotomy was performed. The femoral head was removed which showed severe OA, the acetabular rim was cleared of soft tissue and osteophytes and attention was turned to reaming the acetabulum.  Sequential reaming was performed under fluoroscopic guidance down to the floor of the cotyloid fossa. We reamed to a size 54 mm, and then impacted the acetabular shell.  The liner was then placed after irrigation and attention turned to the femur.  After placing the  femoral hook, the leg was taken to externally rotated, extended and adducted position taking care to perform soft tissue releases to allow for adequate mobilization of the femur. Soft tissue was cleared from the shoulder of the greater trochanter and the hook elevator used to improve exposure of the proximal femur. Sequential broaching performed up to a size 3.  High offset trial neck and +1.5 head were placed. The leg was brought back up to neutral and the construct reduced.  The position and sizing of components, offset and leg lengths were checked using fluoroscopy.  He needed a bit more length and offset.  Stability of the construct was checked in 45 degrees of hip extension and 90 degrees of external rotation.  The femoral head started to sublux anteriorly at 90 degrees. There was no shuck or impingement of prosthesis. We dislocated the prosthesis, dropped the leg back into position, removed trial components, and irrigated copiously.   We decided to go with a +5 head.  The final stem and head were then placed, the leg brought back up, the system reduced and fluoroscopy used to verify positioning.  Antibiotic irrigation was placed in the surgical wound.   We irrigated, obtained hemostasis and closed the capsule using #2 ethibond suture.  A topical mixture of 0.25% bupivacaine and meloxicam was placed deep to the fascia.  One gram of vancomycin powder was placed in the surgical bed.   One gram of topical tranexamic acid was injected into the joint.  The fascia was closed with #1 stratafix, the deep fat layer was closed with 0 vicryl, the subcutaneous layers closed with 2.0 Vicryl Plus and the skin closed with 2.0 nylon and dermabond.  A sterile dressing was applied. The patient was awakened in the operating room and taken to recovery in stable condition.  All sponge, needle, and instrument counts were correct at the end of the case.   Tessa Lerner, my PA, was a medical necessity for opening, closing, limb  positioning, retracting, exposing, and overall facilitation and timely completion of the surgery.  Position: supine  Complications: see description of procedure.  Time Out: performed   Drains/Packing: none  Estimated blood loss: see anesthesia record  Returned to Recovery Room: in good condition.   Antibiotics: yes   Mechanical VTE (DVT) Prophylaxis: sequential compression devices, TED thigh-high  Chemical VTE (DVT) Prophylaxis: xarelto POD 1   Fluid Replacement: see anesthesia record  Specimens Removed: 1 to pathology   Sponge and Instrument Count Correct? yes   PACU: portable radiograph - low AP   Plan/RTC: Return in 2 weeks for staple removal. Weight Bearing/Load Lower Extremity: full  Hip precautions: none Suture Removal: 2 weeks   N. Glee Arvin, MD Aldean Baker 10:14 AM   Implant Name Type Inv. Item Serial No. Manufacturer Lot No. LRB No. Used Action  ACETAB CUP W/GRIPTION 54 - [id number] Plate ACETAB CUP W/GRIPTION 54  [employer] [id number] Left 1 Implanted  LINER NEUTRAL 54X36MM PLUS 4 - [id number] Hips LINER NEUTRAL 54X36MM PLUS 4  [employer] [id number] Left 1 Implanted  STEM FEM ACTIS HIGH SZ3 - [id number] Stem STEM FEM ACTIS HIGH SZ3  [employer] [id number] Left 1 Implanted  HEAD CERAMIC 36 PLUS5 - [id number] Hips HEAD CERAMIC 36 PLUS5  [employer] [id number] Left 1 Implanted

## 2023-08-16 NOTE — Transfer of Care (Signed)
 Immediate Anesthesia Transfer of Care Note  Patient: Terry Vargas  Procedure(s) Performed: LEFT TOTAL HIP ARTHROPLASTY ANTERIOR APPROACH (Left: Hip)  Patient Location: PACU  Anesthesia Type:Spinal  Level of Consciousness: awake  Airway & Oxygen Therapy: Patient Spontanous Breathing and Patient connected to nasal cannula oxygen  Post-op Assessment: Report given to RN and Post -op Vital signs reviewed and stable  Post vital signs: Reviewed and stable  Last Vitals:  Vitals Value Taken Time  BP 102/66 08/16/23 1115  Temp 36.4 C 08/16/23 1038  Pulse 52 08/16/23 1125  Resp 9 08/16/23 1125  SpO2 100 % 08/16/23 1125  Vitals shown include unfiled device data.  Last Pain:  Vitals:   08/16/23 1100  PainSc: 0-No pain         Complications: No notable events documented.

## 2023-08-16 NOTE — Anesthesia Procedure Notes (Addendum)
 Spinal  Patient location during procedure: OR Start time: 08/16/2023 8:52 AM End time: 08/16/2023 8:55 AM Reason for block: surgical anesthesia Staffing Performed: anesthesiologist  Anesthesiologist: Lewie Loron, MD Performed by: Lewie Loron, MD Authorized by: Lewie Loron, MD   Preanesthetic Checklist Completed: patient identified, IV checked, site marked, risks and benefits discussed, surgical consent, monitors and equipment checked, pre-op evaluation and timeout performed Spinal Block Patient position: sitting Prep: DuraPrep and site prepped and draped Patient monitoring: heart rate, continuous pulse ox and blood pressure Approach: midline Location: L3-4 Injection technique: single-shot Needle Needle type: Spinocan  Needle gauge: 25 G Needle length: 9 cm Additional Notes Expiration date of kit checked and confirmed. Patient tolerated procedure well, without complications.

## 2023-08-16 NOTE — Plan of Care (Signed)

## 2023-08-17 ENCOUNTER — Encounter (HOSPITAL_COMMUNITY): Payer: Self-pay | Admitting: Orthopaedic Surgery

## 2023-08-17 DIAGNOSIS — M1612 Unilateral primary osteoarthritis, left hip: Secondary | ICD-10-CM | POA: Diagnosis not present

## 2023-08-17 NOTE — Progress Notes (Signed)
 Subjective: 1 Day Post-Op Procedure(s) (LRB): LEFT TOTAL HIP ARTHROPLASTY ANTERIOR APPROACH (Left) Patient reports pain as mild.    Objective: Vital signs in last 24 hours: Temp:  [97.6 F (36.4 C)-98.5 F (36.9 C)] 98.1 F (36.7 C) (04/01 0719) Pulse Rate:  [48-77] 72 (04/01 0719) Resp:  [12-20] 19 (04/01 0719) BP: (98-171)/(64-94) 157/82 (04/01 0719) SpO2:  [96 %-100 %] 99 % (04/01 0719)  Intake/Output from previous day: 03/31 0701 - 04/01 0700 In: 2040 [P.O.:1440; I.V.:500; IV Piggyback:100] Out: 175 [Urine:100; Blood:75] Intake/Output this shift: No intake/output data recorded.  No results for input(s): "HGB" in the last 72 hours. No results for input(s): "WBC", "RBC", "HCT", "PLT" in the last 72 hours. No results for input(s): "NA", "K", "CL", "CO2", "BUN", "CREATININE", "GLUCOSE", "CALCIUM" in the last 72 hours. No results for input(s): "LABPT", "INR" in the last 72 hours.  Neurologically intact Neurovascular intact Sensation intact distally Intact pulses distally Dorsiflexion/Plantar flexion intact Incision: dressing C/D/I No cellulitis present Compartment soft   Assessment/Plan: 1 Day Post-Op Procedure(s) (LRB): LEFT TOTAL HIP ARTHROPLASTY ANTERIOR APPROACH (Left) Advance diet Up with therapy D/C IV fluids Discharge home with home health once cleared by PT WBAT LLE      Cristie Hem 08/17/2023, 8:27 AM

## 2023-08-17 NOTE — Discharge Summary (Signed)
 Patient ID: Terry Vargas MRN: [id number] DOB/AGE: 1961/01/09 63 y.o.  Admit date: 08/16/2023 Discharge date: 08/17/2023  Admission Diagnoses:  Principal Problem:   Primary osteoarthritis of left hip Active Problems:   Status post total replacement of left hip   Discharge Diagnoses:  Same  Past Medical History:  Diagnosis Date   Abdominal mass, RUQ (right upper quadrant)    Arthritis    Clotting disorder (HCC) 2012, 2020   dvt left leg- on xarelto    Colon cancer (HCC) 12/02/2010   invasive adenocarcinoma, non-polyposis   DVT (deep venous thrombosis) (HCC) 01/28/2011   left leg   Heart murmur    History of chemotherapy    LAST CHEMO 12-23-2012   History of pneumonia 1992   History of radiation therapy 03/14/12 - 04/04/12   retroperitoneal/abdominal region   Hyperlipidemia    no longer have   Hypertension    no longer have   Low iron    history of - no issues now   Skin cancer    squamous cell-L ear, basal cell- face/chest    Surgeries: Procedure(s): LEFT TOTAL HIP ARTHROPLASTY ANTERIOR APPROACH on 08/16/2023   Consultants:   Discharged Condition: Improved  Hospital Course: WEBB WEED is an 63 y.o. male who was admitted 08/16/2023 for operative treatment ofPrimary osteoarthritis of left hip. Patient has severe unremitting pain that affects sleep, daily activities, and work/hobbies. After pre-op clearance the patient was taken to the operating room on 08/16/2023 and underwent  Procedure(s): LEFT TOTAL HIP ARTHROPLASTY ANTERIOR APPROACH.    Patient was given perioperative antibiotics:  Anti-infectives (From admission, onward)    Start     Dose/Rate Route Frequency Ordered Stop   08/16/23 1500  ceFAZolin (ANCEF) IVPB 2g/100 mL premix        2 g 200 mL/hr over 30 Minutes Intravenous Every 6 hours 08/16/23 1228 08/16/23 2201   08/16/23 0946  vancomycin (VANCOCIN) powder  Status:  Discontinued          As needed 08/16/23 0946 08/16/23 1035   08/16/23 0700   ceFAZolin (ANCEF) IVPB 2g/100 mL premix        2 g 200 mL/hr over 30 Minutes Intravenous On call to O.R. 08/16/23 8657 08/16/23 0902   08/16/23 0651  ceFAZolin (ANCEF) 2-4 GM/100ML-% IVPB       Note to Pharmacy: Lacie Draft A: cabinet override      08/16/23 0651 08/16/23 0916        Patient was given sequential compression devices, early ambulation, and chemoprophylaxis to prevent DVT.  Patient benefited maximally from hospital stay and there were no complications.    Recent vital signs: Patient Vitals for the past 24 hrs:  BP Temp Temp src Pulse Resp SpO2  08/17/23 0719 (!) 157/82 98.1 F (36.7 C) Oral 72 19 99 %  08/17/23 0545 (!) 152/94 97.7 F (36.5 C) Oral 61 16 98 %  08/17/23 0049 (!) 171/80 98.1 F (36.7 C) Oral (!) 55 20 100 %  08/16/23 1918 (!) 144/72 97.6 F (36.4 C) Oral 67 20 96 %  08/16/23 1540 (!) 151/94 97.9 F (36.6 C) Oral 77 20 97 %  08/16/23 1234 137/87 98.5 F (36.9 C) Oral (!) 55 20 98 %  08/16/23 1215 124/73 98 F (36.7 C) -- (!) 55 16 99 %  08/16/23 1200 120/78 -- -- (!) 50 16 97 %  08/16/23 1145 (!) 120/92 -- -- (!) 49 14 99 %  08/16/23 1130 115/64 -- -- Marland Kitchen  48 12 99 %  08/16/23 1115 102/66 -- -- (!) 51 13 98 %  08/16/23 1100 110/69 -- -- (!) 57 13 100 %  08/16/23 1045 98/64 -- -- (!) 56 14 98 %  08/16/23 1038 106/67 97.6 F (36.4 C) -- (!) 56 15 100 %     Recent laboratory studies: No results for input(s): "WBC", "HGB", "HCT", "PLT", "NA", "K", "CL", "CO2", "BUN", "CREATININE", "GLUCOSE", "INR", "CALCIUM" in the last 72 hours.  Invalid input(s): "PT", "2"   Discharge Medications:   Allergies as of 08/17/2023   No Known Allergies      Medication List     STOP taking these medications    acetaminophen 500 MG tablet Commonly known as: TYLENOL   aspirin EC 81 MG tablet   HYDROcodone-acetaminophen 5-325 MG tablet Commonly known as: NORCO/VICODIN   KRILL OIL OMEGA-3 PO   traMADol 50 MG tablet Commonly known as: ULTRAM    TURMERIC PO       TAKE these medications    chlorhexidine 4 % external liquid Commonly known as: HIBICLENS Apply 15 mLs (1 Application total) topically as directed for 30 doses. Use as directed daily for 5 days every other week for 6 weeks.   diclofenac sodium 1 % Gel Commonly known as: VOLTAREN Apply 2 g topically 4 (four) times daily. What changed:  when to take this reasons to take this   docusate sodium 100 MG capsule Commonly known as: Colace Take 1 capsule (100 mg total) by mouth daily as needed.   fluorouracil 5 % cream Commonly known as: EFUDEX Apply 1 Application topically 2 (two) times daily as needed (as directed by dermatologist for skin spots).   lidocaine-prilocaine cream Commonly known as: EMLA APPLY TOPICALLY TO PAC SITE ONE HOUR PRIOR TO INJECTION AS NEEDED   loperamide 2 MG capsule Commonly known as: IMODIUM Take 2 mg by mouth in the morning.   methocarbamol 750 MG tablet Commonly known as: Robaxin-750 Take 1 tablet (750 mg total) by mouth 3 (three) times daily as needed for muscle spasms.   multivitamin with minerals Tabs tablet Take 1 tablet by mouth daily in the afternoon.   mupirocin ointment 2 % Commonly known as: BACTROBAN Place 1 Application into the nose 2 (two) times daily for 60 doses. Use as directed 2 times daily for 5 days every other week for 6 weeks.   ondansetron 4 MG tablet Commonly known as: Zofran Take 1 tablet (4 mg total) by mouth every 8 (eight) hours as needed for nausea or vomiting.   OSTEO BI-FLEX ADV TRIPLE ST PO Take 2 tablets by mouth in the morning.   oxyCODONE-acetaminophen 5-325 MG tablet Commonly known as: Percocet Take 1-2 tablets by mouth every 6 (six) hours as needed. To be taken after surgery   VITAMIN D PO Take 1 tablet by mouth in the morning.   Xarelto 20 MG Tabs tablet Generic drug: rivaroxaban TAKE 1 TABLET(20 MG) BY MOUTH DAILY WITH SUPPER What changed: See the new instructions.                Durable Medical Equipment  (From admission, onward)           Start     Ordered   08/16/23 1229  DME Walker rolling  Once       Question:  Patient needs a walker to treat with the following condition  Answer:  History of hip replacement   08/16/23 1228   08/16/23 1229  DME 3  n 1  Once        08/16/23 1228   08/16/23 1229  DME Bedside commode  Once       Question:  Patient needs a bedside commode to treat with the following condition  Answer:  History of hip replacement   08/16/23 1228            Diagnostic Studies: DG Pelvis Portable Result Date: 08/16/2023 CLINICAL DATA:  Left hip arthroplasty EXAM: PORTABLE PELVIS 1-2 VIEWS COMPARISON:  08/16/2023 FINDINGS: Frontal view of the pelvis excludes the left iliac crest by collimation. Left hip arthroplasty is identified in the expected position without evidence of acute complication. Postsurgical changes in the overlying soft tissues. Mild right hip osteoarthritis. IMPRESSION: 1. Unremarkable left hip arthroplasty. Electronically Signed   By: Sharlet Salina M.D.   On: 08/16/2023 15:05   DG HIP UNILAT WITH PELVIS 1V LEFT Result Date: 08/16/2023 CLINICAL DATA:  Left hip arthroplasty. EXAM: DG HIP (WITH OR WITHOUT PELVIS) 1V*L* COMPARISON:  Pelvic radiograph dated 06/08/2023 FINDINGS: Four intraoperative fluoroscopic spot images provided. The total fluoroscopic time is 31 seconds with a cumulative air kerma of 3.13 mGy. Total left hip arthroplasty. IMPRESSION: Intraoperative fluoroscopic spot images. Electronically Signed   By: Elgie Collard M.D.   On: 08/16/2023 14:37   DG C-Arm 1-60 Min-No Report Result Date: 08/16/2023 Fluoroscopy was utilized by the requesting physician.  No radiographic interpretation.   DG C-Arm 1-60 Min-No Report Result Date: 08/16/2023 Fluoroscopy was utilized by the requesting physician.  No radiographic interpretation.    Disposition: Discharge disposition: 01-Home or Self Care           Follow-up Information     Cristie Hem, PA-C. Schedule an appointment as soon as possible for a visit in 2 week(s).   Specialty: Orthopedic Surgery Contact information: [address] [place] [address] [phone]         [facility] Follow up.   Why: [facility] will contact you for the first home visit Contact information: [phone]                 Signed: Cristie Hem 08/17/2023, 8:27 AM

## 2023-08-17 NOTE — Progress Notes (Signed)
 Physical Therapy Treatment  Patient Details Name: Terry Vargas MRN: [id number] DOB: 06-20-1960 Today's Date: 08/17/2023   History of Present Illness Pt is a 63 y/o male who presents to [facility] hospital on 08/16/2023 for elective L THA, anterior approach. PMH includes colon cancer, DVT, skin cancer.    PT Comments  Pt progressing towards physical therapy goals. Was able to perform transfers and ambulation with gross modified independence to supervision for safety. Pt quick to move and requires frequent VC's for technique during gait training. Encouraged quality gait over excessive distance. Pt was educated on HEP, car transfer, and appropriate activity progression. Will continue to follow and progress as able per POC.    If plan is discharge home, recommend the following: A little help with bathing/dressing/bathroom;Assistance with cooking/housework;Assist for transportation;Help with stairs or ramp for entrance   Can travel by private vehicle        Equipment Recommendations  Rolling walker (2 wheels)    Recommendations for Other Services       Precautions / Restrictions Precautions Precautions: Fall Recall of Precautions/Restrictions: Intact Precaution/Restrictions Comments: None- direct anterior THA Restrictions Weight Bearing Restrictions Per Provider Order: Yes LLE Weight Bearing Per Provider Order: Weight bearing as tolerated     Mobility  Bed Mobility Overal bed mobility: Needs Assistance Bed Mobility: Supine to Sit, Sit to Supine     Supine to sit: Supervision, HOB elevated Sit to supine: Supervision, HOB elevated   General bed mobility comments: VC's for optimal technique. Pt was cued not to pull surgical knee up to chest during transition to supine.    Transfers Overall transfer level: Modified independent Equipment used: Rolling walker (2 wheels) Transfers: Sit to/from Stand                  Ambulation/Gait Ambulation/Gait assistance: Supervision Gait  Distance (Feet): 500 Feet Assistive device: Rolling walker (2 wheels) Gait Pattern/deviations: Step-through pattern, Decreased stride length, Trunk flexed Gait velocity: Decreased Gait velocity interpretation: 1.31 - 2.62 ft/sec, indicative of limited community ambulator   General Gait Details: VC's for increased heel strike, improved posture, closer walker proximity, and forward gaze. No assist required. Pt able to make corrective changes with cues.   Stairs Stairs: Yes Stairs assistance: Supervision Stair Management: No rails, Step to pattern, Forwards Number of Stairs: 3 General stair comments: Pt able to demonstrate without UE support   Wheelchair Mobility     Tilt Bed    Modified Rankin (Stroke Patients Only)       Balance Overall balance assessment: Needs assistance Sitting-balance support: No upper extremity supported, Feet supported Sitting balance-Leahy Scale: Good     Standing balance support: Bilateral upper extremity supported, Reliant on assistive device for balance Standing balance-Leahy Scale: Poor                              Communication Communication Communication: No apparent difficulties  Cognition Arousal: Alert Behavior During Therapy: WFL for tasks assessed/performed   PT - Cognitive impairments: No apparent impairments                         Following commands: Intact      Cueing Cueing Techniques: Verbal cues  Exercises Total Joint Exercises Quad Sets: 10 reps Short Arc Quad: 10 reps Heel Slides: 10 reps Hip ABduction/ADduction: 10 reps Long Arc Quad: 10 reps    General Comments  Pertinent Vitals/Pain Pain Assessment Pain Assessment: Faces Faces Pain Scale: Hurts little more Pain Location: L hip Pain Descriptors / Indicators: Burning Pain Intervention(s): Limited activity within patient's tolerance, Monitored during session, Repositioned    Home Living                           Prior Function            PT Goals (current goals can now be found in the care plan section) Acute Rehab PT Goals Patient Stated Goal: Back to exercise - cycling PT Goal Formulation: With patient Time For Goal Achievement: 08/20/23 Potential to Achieve Goals: Good Progress towards PT goals: Progressing toward goals    Frequency    7X/week      PT Plan      Co-evaluation              AM-PAC PT "6 Clicks" Mobility   Outcome Measure  Help needed turning from your back to your side while in a flat bed without using bedrails?: None Help needed moving from lying on your back to sitting on the side of a flat bed without using bedrails?: A Little Help needed moving to and from a bed to a chair (including a wheelchair)?: A Little Help needed standing up from a chair using your arms (e.g., wheelchair or bedside chair)?: None Help needed to walk in hospital room?: A Little Help needed climbing 3-5 steps with a railing? : A Little 6 Click Score: 20    End of Session Equipment Utilized During Treatment: Gait belt Activity Tolerance: Patient tolerated treatment well Patient left: in bed;with call bell/phone within reach Nurse Communication: Mobility status PT Visit Diagnosis: Other abnormalities of gait and mobility (R26.89);Muscle weakness (generalized) (M62.81)     Time: 8119-1478 PT Time Calculation (min) (ACUTE ONLY): 39 min  Charges:    $Gait Training: 23-37 mins $Therapeutic Exercise: 8-22 mins PT General Charges $$ ACUTE PT VISIT: 1 Visit                     Conni Slipper, PT, DPT Acute Rehabilitation Services Secure Chat Preferred Office: [phone]    Marylynn Pearson 08/17/2023, 11:34 AM

## 2023-08-31 ENCOUNTER — Ambulatory Visit (INDEPENDENT_AMBULATORY_CARE_PROVIDER_SITE_OTHER): Admitting: Physician Assistant

## 2023-08-31 ENCOUNTER — Encounter: Payer: Self-pay | Admitting: Physician Assistant

## 2023-08-31 DIAGNOSIS — Z96642 Presence of left artificial hip joint: Secondary | ICD-10-CM

## 2023-08-31 MED ORDER — OXYCODONE HCL 5 MG PO CAPS: 5.0000 mg | ORAL_CAPSULE | ORAL | 0 refills | Status: DC | PRN

## 2023-08-31 NOTE — Progress Notes (Signed)
 Office Visit Note   Patient: Terry Vargas           Date of Birth: October 16, 1960           MRN: [id number] Visit Date: 08/31/2023              Requested by: No referring provider defined for this encounter. PCP: Patient, No Pcp Per  No chief complaint on file.     HPI: Patient is a 63 year old gentleman who is a patient of Dr. Roda Shutters.  He is 2 weeks status post left total hip arthroplasty anterior approach.  Denies fever chills or calf pain.  Only time he actually experiences pain is at night.  He is already weaned off a walker and a cane and is walking his dog a mile and a half daily.  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  Requesting 1 refill of the oxycodone IR's  Assessment & Plan: Visit Diagnoses:  1. Status post total replacement of left hip     Plan: 2 weeks status post left total hip arthroplasty, doing well.  Will follow-up with Mardella Layman or Dr. Roda Shutters in 4 weeks have called him in some more pain medicine  Follow-Up Instructions: Return in about 4 weeks (around 09/28/2023).   Ortho Exam  Patient is alert, oriented, no adenopathy, well-dressed, normal affect, normal respiratory effort. Examination left hip well-healed surgical incision neurovascularly intact no pain in the calf negative Homans' sign bilaterally no induration no seroma around the incision swelling is well-controlled  Imaging: No results found. No images are attached to the encounter.  Labs: No results found for: "HGBA1C", "ESRSEDRATE", "CRP", "LABURIC", "REPTSTATUS", "GRAMSTAIN", "CULT", "LABORGA"   Lab Results  Component Value Date   ALBUMIN 4.0 08/12/2023   ALBUMIN 3.8 09/27/2018   ALBUMIN 3.0 (L) 12/21/2012    Lab Results  Component Value Date   MG 2.1 11/23/2012   MG 1.9 11/09/2012   MG 2.2 09/28/2012   No results found for: "VD25OH"  No results found for: "PREALBUMIN"    Latest Ref Rng & Units 08/12/2023    9:10 AM 09/27/2018    1:46 PM 03/06/2013   11:24 AM  CBC  EXTENDED  WBC 4.0 - 10.5 K/uL 6.6  4.9  5.2   RBC 4.22 - 5.81 MIL/uL 4.50  4.08  3.47   Hemoglobin 13.0 - 17.0 g/dL 40.9  81.1  91.4   HCT 39.0 - 52.0 % 41.9  37.8  30.4   Platelets 150 - 400 K/uL 195  164  191   NEUT# 1.7 - 7.7 K/uL  3.1  4.2   Lymph# 0.7 - 4.0 K/uL  1.2  0.4      There is no height or weight on file to calculate BMI.  Orders:  No orders of the defined types were placed in this encounter.  Meds ordered this encounter  Medications   oxycodone (OXY-IR) 5 MG capsule    Sig: Take 1 capsule (5 mg total) by mouth every 4 (four) hours as needed.    Dispense:  30 capsule    Refill:  0     Procedures: No procedures performed  Clinical Data: No additional findings.  ROS:  All other systems negative, except as noted in the HPI. Review of Systems  Objective: Vital Signs: There were no vitals taken for this visit.  Specialty Comments:  No specialty comments available.  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   DVT of leg (deep venous thrombosis) (HCC) 01/28/2011   Colon cancer (HCC) 12/02/2010   Past Medical History:  Diagnosis Date   Abdominal mass, RUQ (right upper quadrant)    Arthritis    Clotting disorder (HCC) 2012, 2020   dvt left leg- on xarelto    Colon cancer (HCC) 12/02/2010   invasive adenocarcinoma, non-polyposis   DVT (deep venous thrombosis) (HCC) 01/28/2011   left leg   Heart  murmur    History of chemotherapy    LAST CHEMO 12-23-2012   History of pneumonia 1992   History of radiation therapy 03/14/12 - 04/04/12   retroperitoneal/abdominal region   Hyperlipidemia    no longer have   Hypertension    no longer have   Low iron    history of - no issues now   Skin cancer    squamous cell-L ear, basal cell- face/chest    Family History  Problem Relation Age of Onset   Kidney cancer Father    Colon cancer Father        1986, 1991   Skin cancer Father        squamous cell-forehead, nose   Colon cancer Paternal Grandmother    Prostate cancer Paternal Grandfather    Colon cancer Cousin 35       paternal   Colon cancer Cousin 75       paternal   Colon cancer Paternal Aunt    Colon polyps Neg Hx    Esophageal cancer Neg Hx    Rectal cancer Neg Hx    Stomach cancer Neg Hx     Past Surgical History:  Procedure Laterality Date    pot-a-cath insertion     COLON SURGERY     COLONOSCOPY     EUS N/A 01/12/2013   Procedure: UPPER ENDOSCOPIC ULTRASOUND (EUS) LINEAR;  Surgeon: Janel Medford, MD;  Location: [facility];  Service: Endoscopy;  Laterality: N/A;  celiac plexus    gastrojejunostomy     MOHS SURGERY  1996   left ear- squamous cell   open right colectomy  11/25/2010   hemicolectomy w/appendectomy   POLYPECTOMY     PORT-A-CATH REMOVAL Left 08/04/2012   Procedure: REMOVAL PORT-A-CATH;  Surgeon: Enid Harry, MD;  Location: [facility] OR;  Service: General;  Laterality: Left;   PORT-A-CATH REMOVAL  2014   PORTACATH PLACEMENT Left 08/15/2012   Procedure: INSERTION PORT-A-CATH;  Surgeon: Enid Harry, MD;  Location: [facility];  Service: General;  Laterality: Left;   ROTATOR CUFF REPAIR Right    SKIN CANCER EXCISION     TONSILLECTOMY     as child   TOTAL HIP ARTHROPLASTY Left 08/16/2023   Procedure: LEFT TOTAL HIP ARTHROPLASTY ANTERIOR APPROACH;  Surgeon: Wes Hamman, MD;  Location: [facility] OR;  Service: Orthopedics;  Laterality: Left;   3-C   Social History   Occupational History   Not on file  Tobacco Use   Smoking status:  Never   Smokeless tobacco: Never  Vaping Use   Vaping status: Never Used  Substance and Sexual Activity   Alcohol use: Yes    Alcohol/week: 2.0 - 4.0 standard drinks of alcohol    Types: 1 - 2 Cans of beer, 1 - 2 Shots of liquor per week    Comment: 1-2 drinks daily   Drug use: No   Sexual activity: Not on file

## 2023-09-09 ENCOUNTER — Other Ambulatory Visit: Payer: Self-pay | Admitting: Orthopaedic Surgery

## 2023-09-09 MED ORDER — OXYCODONE-ACETAMINOPHEN 5-325 MG PO TABS: 1.0000 | ORAL_TABLET | Freq: Four times a day (QID) | ORAL | 0 refills | Status: DC | PRN

## 2023-09-28 ENCOUNTER — Encounter: Admitting: Orthopaedic Surgery

## 2023-10-06 ENCOUNTER — Telehealth: Payer: Self-pay | Admitting: Gastroenterology

## 2023-10-06 NOTE — Telephone Encounter (Signed)
 Good afternoon Dr. Karene Oto   I received a call from this patient, wishing to schedule his endo colon procedure directly. Patient is currently on Xarelto  but states since he has this procedure done yearly he knows how to hold it and does not feel the need to wait until late June for a preliminary appointment. Would you be okay with patient scheduling double procedure directly or does he need a consult first?  Thank you.

## 2023-10-13 ENCOUNTER — Other Ambulatory Visit (INDEPENDENT_AMBULATORY_CARE_PROVIDER_SITE_OTHER)

## 2023-10-13 ENCOUNTER — Ambulatory Visit (INDEPENDENT_AMBULATORY_CARE_PROVIDER_SITE_OTHER): Admitting: Orthopaedic Surgery

## 2023-10-13 DIAGNOSIS — Z96642 Presence of left artificial hip joint: Secondary | ICD-10-CM

## 2023-10-13 NOTE — Progress Notes (Signed)
 Post-Op Visit Note   Patient: Terry Vargas           Date of Birth: [birth year]           MRN: [id number] Visit Date: 10/13/2023 PCP: Patient, No Pcp Per   Assessment & Plan:  Chief Complaint:  Chief Complaint  Patient presents with   Left Hip - Routine Post Op    08/16/23 Left THA   Visit Diagnoses:  1. Status post total replacement of left hip     Plan: History of Present Illness ARVLE GRABE "Hinton Luis" is a 63 year old male who presents for an 8-week postoperative follow-up after hip replacement surgery.  He is 8 weeks postoperative from left hip replacement surgery and engages in physical activities such as walking, stretching, and light leg presses. He uses an Air traffic controller for about 40 minutes to maintain cardiovascular fitness. He experiences a sensation that he cannot consistently reproduce, without associated pain. He performs daily activities without restriction and feels his strength is improving. He is on xarelto  chronically. A physical therapist who visited him at home indicated that he did not require further physical therapy sessions.  Physical Exam MUSCULOSKELETAL: Surgical scar on hip well-healed.  Fluid painless motion.  Leg lengths equal.  Assessment and Plan Postoperative care following left hip replacement Eight weeks postoperative. X-rays show well-positioned implant. Intermittent discomfort likely from scar tissue and inflammation. Progressing well with exercises. Advised on dislocation risks and prevention. - Continue current exercise regimen, including leg presses and cycling. - Avoid movements exceeding ninety degrees to prevent dislocation. - Take antibiotics prophylactically before dental cleanings for two years to prevent infection. - Schedule follow-up appointment in four months.  Follow-Up Instructions: Return in about 4 months (around 02/13/2024).   Orders:  Orders Placed This Encounter  Procedures   XR HIP UNILAT W OR W/O PELVIS 2-3 VIEWS  LEFT   No orders of the defined types were placed in this encounter.   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   Skin cancer    DVT (deep venous thrombosis) (HCC) 09/23/2011   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   DVT of leg (deep venous thrombosis) (HCC) 01/28/2011   Colon cancer (HCC) 12/02/2010   Past Medical History:  Diagnosis Date   Abdominal mass, RUQ (right upper quadrant)    Arthritis    Clotting disorder (HCC) 2012, 2020   dvt left leg- on xarelto     Colon cancer (HCC) 12/02/2010   invasive adenocarcinoma, non-polyposis   DVT (deep venous thrombosis) (HCC) 01/28/2011   left leg   Heart murmur    History of chemotherapy    LAST CHEMO 12-23-2012   History of pneumonia 1992   History of radiation therapy 03/14/12 - 04/04/12   retroperitoneal/abdominal region   Hyperlipidemia    no longer have   Hypertension    no longer have   Low  iron    history of - no issues now   Skin cancer    squamous cell-L ear, basal cell- face/chest    Family History  Problem Relation Age of Onset   Kidney cancer Father    Colon cancer Father        53, 54   Skin cancer Father        squamous cell-forehead, nose   Colon cancer Paternal Grandmother    Prostate cancer Paternal Grandfather    Colon cancer Cousin 35       paternal   Colon cancer Cousin 80       paternal   Colon cancer  Paternal Aunt    Colon polyps Neg Hx    Esophageal cancer Neg Hx    Rectal cancer Neg Hx    Stomach cancer Neg Hx     Past Surgical History:  Procedure Laterality Date    pot-a-cath insertion     COLON SURGERY     COLONOSCOPY     EUS N/A 01/12/2013   Procedure: UPPER ENDOSCOPIC ULTRASOUND (EUS) LINEAR;  Surgeon: Janel Medford, MD;  Location: [facility];  Service: Endoscopy;  Laterality: N/A;  celiac plexus    gastrojejunostomy     MOHS SURGERY  1996   left ear- squamous cell   open right colectomy  11/25/2010   hemicolectomy w/appendectomy   POLYPECTOMY     PORT-A-CATH REMOVAL Left 08/04/2012   Procedure: REMOVAL PORT-A-CATH;  Surgeon: Enid Harry, MD;  Location: [facility] OR;  Service: General;  Laterality: Left;   PORT-A-CATH REMOVAL  2014   PORTACATH PLACEMENT Left 08/15/2012   Procedure: INSERTION PORT-A-CATH;  Surgeon: Enid Harry, MD;  Location: [facility];  Service: General;  Laterality: Left;   ROTATOR CUFF REPAIR Right    SKIN CANCER EXCISION     TONSILLECTOMY     as child   TOTAL HIP ARTHROPLASTY Left 08/16/2023   Procedure: LEFT TOTAL HIP ARTHROPLASTY ANTERIOR APPROACH;  Surgeon: Wes Hamman, MD;  Location: [facility] OR;  Service: Orthopedics;  Laterality: Left;  3-C   Social History   Occupational History   Not on file  Tobacco Use   Smoking status: Never   Smokeless tobacco: Never  Vaping Use   Vaping status: Never Used  Substance and Sexual Activity   Alcohol  use: Yes    Alcohol /week: 2.0 - 4.0 standard drinks of alcohol     Types: 1 - 2 Cans of beer, 1 - 2 Shots of liquor per week    Comment: 1-2 drinks daily   Drug use: No   Sexual activity: Not on file

## 2023-10-15 ENCOUNTER — Encounter: Payer: Self-pay | Admitting: Gastroenterology

## 2023-10-31 ENCOUNTER — Other Ambulatory Visit: Payer: Self-pay | Admitting: Medical Genetics

## 2023-11-04 ENCOUNTER — Other Ambulatory Visit: Payer: Self-pay

## 2023-11-17 ENCOUNTER — Encounter: Payer: Self-pay | Admitting: Oncology

## 2023-11-22 ENCOUNTER — Ambulatory Visit (AMBULATORY_SURGERY_CENTER)

## 2023-11-22 VITALS — Ht 68.0 in | Wt 150.0 lb

## 2023-11-22 DIAGNOSIS — Z1509 Genetic susceptibility to other malignant neoplasm: Secondary | ICD-10-CM

## 2023-11-22 DIAGNOSIS — Z85038 Personal history of other malignant neoplasm of large intestine: Secondary | ICD-10-CM

## 2023-11-22 MED ORDER — NA SULFATE-K SULFATE-MG SULF 17.5-3.13-1.6 GM/177ML PO SOLN: 1.0000 | Freq: Once | ORAL | 0 refills | Status: AC

## 2023-11-22 NOTE — Progress Notes (Signed)

## 2023-11-23 ENCOUNTER — Encounter: Payer: Self-pay | Admitting: Gastroenterology

## 2023-11-25 ENCOUNTER — Other Ambulatory Visit (HOSPITAL_COMMUNITY)
Admission: RE | Admit: 2023-11-25 | Discharge: 2023-11-25 | Disposition: A | Discharge status: Home / Self Care | Payer: Self-pay | Source: Ambulatory Visit | Attending: Medical Genetics | Admitting: Medical Genetics

## 2023-12-02 ENCOUNTER — Encounter: Admitting: Gastroenterology

## 2023-12-02 ENCOUNTER — Ambulatory Visit: Admitting: Gastroenterology

## 2023-12-02 ENCOUNTER — Encounter: Payer: Self-pay | Admitting: Gastroenterology

## 2023-12-02 VITALS — BP 108/66 | HR 48 | Temp 98.4°F | Resp 16 | Ht 68.0 in | Wt 150.0 lb

## 2023-12-02 DIAGNOSIS — K315 Obstruction of duodenum: Secondary | ICD-10-CM

## 2023-12-02 DIAGNOSIS — K3189 Other diseases of stomach and duodenum: Secondary | ICD-10-CM | POA: Diagnosis not present

## 2023-12-02 DIAGNOSIS — Z85038 Personal history of other malignant neoplasm of large intestine: Secondary | ICD-10-CM

## 2023-12-02 DIAGNOSIS — K2970 Gastritis, unspecified, without bleeding: Secondary | ICD-10-CM

## 2023-12-02 DIAGNOSIS — K641 Second degree hemorrhoids: Secondary | ICD-10-CM | POA: Diagnosis not present

## 2023-12-02 DIAGNOSIS — K5730 Diverticulosis of large intestine without perforation or abscess without bleeding: Secondary | ICD-10-CM | POA: Diagnosis not present

## 2023-12-02 DIAGNOSIS — Z980 Intestinal bypass and anastomosis status: Secondary | ICD-10-CM | POA: Diagnosis not present

## 2023-12-02 DIAGNOSIS — Z1211 Encounter for screening for malignant neoplasm of colon: Secondary | ICD-10-CM

## 2023-12-02 DIAGNOSIS — Z1509 Genetic susceptibility to other malignant neoplasm: Secondary | ICD-10-CM | POA: Diagnosis not present

## 2023-12-02 DIAGNOSIS — K319 Disease of stomach and duodenum, unspecified: Secondary | ICD-10-CM | POA: Diagnosis not present

## 2023-12-02 MED ORDER — SODIUM CHLORIDE 0.9 % IV SOLN: 500.0000 mL | Freq: Once | INTRAVENOUS | Status: DC

## 2023-12-02 NOTE — Op Note (Signed)
 [facility] Patient Name: Terry Vargas Procedure Date: 12/02/2023 8:37 AM MRN: [id number] Endoscopist: Sandor Flatter , MD, [id number] Age: 63 Referring MD:  Date of Birth: Dec 28, 1960 Gender: Male Account #: [id number] Procedure:                Colonoscopy Indications:              High risk colon cancer surveillance: Personal                            history of colon cancer                           63 yo male with a history of Lynch syndrome with                            colon cancer diagnosed 10/2010 s/p open right                            hemicolectomy 11/2010. Tumor was adherent to the                            duodenum and pancreas and penetrates visceral                            peritoneum with 3/36 lymph nodes positive for                            lymphovascular invasion. Stage T4 N2. Surgical                            margins negative. Treated with FOLFOX, palliative                            radiation, then FOLFIRI, followed by nivolumab.                           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 Medicines:                Monitored Anesthesia Care Procedure:                Pre-Anesthesia Assessment:                           - Prior to  the procedure, a History and Physical                            was performed, and patient medications and                            allergies were reviewed. The patient's tolerance of                            previous anesthesia was also reviewed. The risks                            and benefits of the procedure and the sedation                            options and risks were discussed with the patient.                             All questions were answered, and informed consent                            was obtained. Prior Anticoagulants: The patient has                            taken Xarelto  (rivaroxaban ), last dose was 2 days                            prior to procedure. ASA Grade Assessment: II - A                            patient with mild systemic disease. After reviewing                            the risks and benefits, the patient was deemed in                            satisfactory condition to undergo the procedure.                           After obtaining informed consent, the colonoscope                            was passed under direct vision. Throughout the                            procedure, the patient's blood pressure, pulse, and                            oxygen saturations were monitored continuously. The                            CF [id number] #[id number] was introduced through the anus  and advanced to the the ileocolonic anastomosis.                            The colonoscopy was performed without difficulty.                            The patient tolerated the procedure well. The                            quality of the bowel preparation was good. The                            rectum, ileocolonic anastamosis were photographed. Scope In: 8:58:05 AM Scope Out: 9:08:27 AM Scope Withdrawal Time: 0 hours 8 minutes 30 seconds  Total Procedure Duration: 0 hours 10 minutes 22 seconds  Findings:                 The perianal and digital rectal examinations were                            normal.                           There was evidence of a prior end-to-side                            ileo-colonic anastomosis in the transverse colon.                            This was patent and was characterized by healthy                            appearing mucosa.                           A few small-mouthed diverticula were found in the                             sigmoid colon.                           Non-bleeding internal hemorrhoids were found during                            retroflexion. The hemorrhoids were small and Grade                            II (internal hemorrhoids that prolapse but reduce                            spontaneously). Complications:            No immediate complications. Estimated Blood Loss:     Estimated blood loss: none. Impression:               - Patent end-to-side ileo-colonic anastomosis,  characterized by healthy appearing mucosa.                           - Diverticulosis in the sigmoid colon.                           - Non-bleeding internal hemorrhoids.                           - No specimens collected. Recommendation:           - Patient has a contact number available for                            emergencies. The signs and symptoms of potential                            delayed complications were discussed with the                            patient. Return to normal activities tomorrow.                            Written discharge instructions were provided to the                            patient.                           - Resume previous diet.                           - Continue present medications.                           - Repeat colonoscopy in 1 year for surveillance.                           - Resume Xarelto  (rivaroxaban ) at prior dose                            tomorrow.                           - Return to GI clinic PRN. Sandor Flatter, MD 12/02/2023 9:28:45 AM

## 2023-12-02 NOTE — Progress Notes (Signed)
 Called to room to assist during endoscopic procedure.  Patient ID and intended procedure confirmed with present staff. Received instructions for my participation in the procedure from the performing physician.

## 2023-12-02 NOTE — Progress Notes (Signed)
 GASTROENTEROLOGY PROCEDURE H&P NOTE   Primary Care Physician: Patient, No Pcp Per    Reason for Procedure:  Lynch syndrome, history of colon cancer  Plan:    EGD, colonoscopy  Patient is appropriate for endoscopic procedure(s) in the ambulatory ([facility]) setting.  The nature of the procedure, as well as the risks, benefits, and alternatives were carefully and thoroughly reviewed with the patient. Ample time for discussion and questions allowed. The patient understood, was satisfied, and agreed to proceed.     HPI: Terry Vargas is a 63 y.o. male who presents for EGD and colonoscopy for ongoing surveillance.  History of Lynch syndrome with colon cancer diagnosed 10/2010 s/p open right hemicolectomy 11/2010.  Tumor was adherent to the duodenum and pancreas and penetrates visceral peritoneum with 3/36 lymph nodes positive for lymphovascular invasion.  Stage T4 N2.  Surgical margins negative.  Treated with FOLFOX, palliative radiation, then FOLFIRI, followed by nivolumab for metastatic disease.    Since then, he has had regular surveillance endoscopy/colonoscopy over the years.  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  Last upper endoscopy was 08/22/2019 by Dr. Teressa and notable for healthy-appearing surgical anastomosis, distal duodenal bulb stricture measuring 6-7 mm inner diameter but traversable with gastroscope, and recommended to repeat in 3 years.  Last colonoscopy was 02/19/2022 by me with normal appearing surgical anastomosis, left-sided diverticulosis, internal hemorrhoids, but otherwise no polyps and recommended repeat in 1 year along with upper endoscopy at that same time.   He was seen at [facility] earlier this year and CT C/A/P and subsequent PET/CT without metabolically active disease suggestive of  recurrence or metastasis.   He is otherwise without any GI symptoms.  Holding Xarelto  for procedures today.  Normal CBC and CMP in 07/2023.  Family history notable for colon cancer in multiple family members, to include father, paternal aunt, paternal grandmother, cousin x 2.   Past Medical History:  Diagnosis Date   Abdominal mass, RUQ (right upper quadrant)    Arthritis    Clotting disorder (HCC) 2012, 2020   dvt left leg- on xarelto     Colon cancer (HCC) 12/02/2010   invasive adenocarcinoma, non-polyposis   DVT (deep venous thrombosis) (HCC) 01/28/2011   left leg   Heart murmur    History of chemotherapy    LAST CHEMO 12-23-2012   History of pneumonia 1992   History of radiation therapy 03/14/12 - 04/04/12   retroperitoneal/abdominal region   Hyperlipidemia    no longer have   Hypertension    no longer have   Low iron    history of - no issues now   Skin cancer    squamous cell-L ear, basal cell- face/chest    Past Surgical History:  Procedure Laterality Date    pot-a-cath insertion     COLON SURGERY     COLONOSCOPY     EUS N/A 01/12/2013   Procedure: UPPER ENDOSCOPIC ULTRASOUND (EUS) LINEAR;  Surgeon: Toribio SHAUNNA Teressa, MD;  Location: [facility];  Service: Endoscopy;  Laterality: N/A;  celiac plexus    gastrojejunostomy     MOHS SURGERY  1996   left ear- squamous cell   open right colectomy  11/25/2010   hemicolectomy w/appendectomy   POLYPECTOMY     PORT-A-CATH REMOVAL Left 08/04/2012   Procedure: REMOVAL PORT-A-CATH;  Surgeon: Donnice Bury, MD;  Location: [facility] OR;  Service:  General;  Laterality: Left;   PORT-A-CATH REMOVAL  2014   PORTACATH PLACEMENT Left 08/15/2012   Procedure: INSERTION PORT-A-CATH;  Surgeon: Donnice Bury, MD;  Location: [facility];  Service: General;  Laterality: Left;   ROTATOR CUFF REPAIR Right    SKIN CANCER EXCISION     TONSILLECTOMY     as child   TOTAL HIP ARTHROPLASTY Left 08/16/2023   Procedure: LEFT TOTAL  HIP ARTHROPLASTY ANTERIOR APPROACH;  Surgeon: Jerri Kay HERO, MD;  Location: [facility] OR;  Service: Orthopedics;  Laterality: Left;  3-C    Prior to Admission medications   Medication Sig Start Date End Date Taking? Authorizing Provider  diclofenac  sodium (VOLTAREN ) 1 % GEL Apply 2 g topically 4 (four) times daily. Patient taking differently: Apply 2 g topically 4 (four) times daily as needed (pain.). 02/25/16  Yes Draper, Evalene SAUNDERS, DO  Misc Natural Products (OSTEO BI-FLEX ADV TRIPLE ST PO) Take 2 tablets by mouth in the morning.   Yes [provider]  Multiple Vitamin (MULTIVITAMIN WITH MINERALS) TABS tablet Take 1 tablet by mouth daily in the afternoon.   Yes [provider]  VITAMIN D PO Take 1 tablet by mouth in the morning.   Yes [provider]  fluorouracil  (EFUDEX ) 5 % cream Apply 1 Application topically 2 (two) times daily as needed (as directed by dermatologist for skin spots). 03/22/19   [provider]  lidocaine -prilocaine  (EMLA ) cream APPLY TOPICALLY TO PAC SITE ONE HOUR PRIOR TO INJECTION AS NEEDED 01/16/14   Cloretta Arley NOVAK, MD  loperamide (IMODIUM) 2 MG capsule Take 2 mg by mouth in the morning.    [provider]  XARELTO  20 MG TABS tablet TAKE 1 TABLET(20 MG) BY MOUTH DAILY WITH SUPPER Patient taking differently: Take 20 mg by mouth daily. 10/17/19   Cloretta Arley NOVAK, MD    Current Outpatient Medications  Medication Sig Dispense Refill   diclofenac  sodium (VOLTAREN ) 1 % GEL Apply 2 g topically 4 (four) times daily. (Patient taking differently: Apply 2 g topically 4 (four) times daily as needed (pain.).) 300 g 3   Misc Natural Products (OSTEO BI-FLEX ADV TRIPLE ST PO) Take 2 tablets by mouth in the morning.     Multiple Vitamin (MULTIVITAMIN WITH MINERALS) TABS tablet Take 1 tablet by mouth daily in the afternoon.     VITAMIN D PO Take 1 tablet by mouth in the morning.     fluorouracil  (EFUDEX ) 5 % cream Apply 1 Application topically 2 (two)  times daily as needed (as directed by dermatologist for skin spots).     lidocaine -prilocaine  (EMLA ) cream APPLY TOPICALLY TO PAC SITE ONE HOUR PRIOR TO INJECTION AS NEEDED 30 g PRN   loperamide (IMODIUM) 2 MG capsule Take 2 mg by mouth in the morning.     XARELTO  20 MG TABS tablet TAKE 1 TABLET(20 MG) BY MOUTH DAILY WITH SUPPER (Patient taking differently: Take 20 mg by mouth daily.) 90 tablet 3   Current Facility-Administered Medications  Medication Dose Route Frequency Provider Last Rate Last Admin   0.9 %  sodium chloride  infusion  500 mL Intravenous Once Berdene Askari V, DO        Allergies as of 12/02/2023   (No Known Allergies)    Family History  Problem Relation Age of Onset   Kidney cancer Father    Colon cancer Father        75, 26   Skin cancer Father  squamous cell-forehead, nose   Colon cancer Paternal Grandmother    Prostate cancer Paternal Grandfather    Colon cancer Cousin 35       paternal   Colon cancer Cousin 50       paternal   Colon cancer Paternal Aunt    Colon polyps Neg Hx    Esophageal cancer Neg Hx    Rectal cancer Neg Hx    Stomach cancer Neg Hx     Social History   Socioeconomic History   Marital status: Divorced    Spouse name: Not on file   Number of children: Not on file   Years of education: Not on file   Highest education level: Not on file  Occupational History   Not on file  Tobacco Use   Smoking status: Never   Smokeless tobacco: Never  Vaping Use   Vaping status: Never Used  Substance and Sexual Activity   Alcohol  use: Yes    Alcohol /week: 2.0 - 4.0 standard drinks of alcohol     Types: 1 - 2 Cans of beer, 1 - 2 Shots of liquor per week    Comment: 1-2 drinks daily   Drug use: No   Sexual activity: Not on file  Other Topics Concern   Not on file  Social History Narrative   Not on file   Social Drivers of Health   Financial Resource Strain: Not on file  Food Insecurity: Not on file  Transportation Needs:  Not on file  Physical Activity: Not on file  Stress: Not on file  Social Connections: Not on file  Intimate Partner Violence: Not on file    Physical Exam: Vital signs in last 24 hours: @BP  (!) 146/81   Pulse (!) 50   Temp 98.4 F (36.9 C) (Temporal)   Ht 5' 8 (1.727 m)   Wt 150 lb (68 kg)   SpO2 97%   BMI 22.81 kg/m  GEN: NAD EYE: Sclerae anicteric ENT: MMM CV: Non-tachycardic Pulm: CTA b/l GI: Soft, NT/ND NEURO:  Alert & Oriented x 3   Sandor Flatter, DO [facility]   12/02/2023 8:37 AM

## 2023-12-02 NOTE — Progress Notes (Signed)
 Pt's states no medical or surgical changes since previsit or office visit.

## 2023-12-02 NOTE — Progress Notes (Signed)
 Report given to PACU, vss

## 2023-12-02 NOTE — Op Note (Signed)
 [facility] Patient Name: Terry Vargas Procedure Date: 12/02/2023 8:38 AM MRN: [id number] Endoscopist: Sandor Flatter , MD, [id number] Age: 63 Referring MD:  Date of Birth: 1960-06-20 Gender: Male Account #: [id number] Procedure:                Upper GI endoscopy Indications:              Hereditary nonpolyposis colorectal cancer (Lynch                            Syndrome)                           64 yo male with a history of Lynch syndrome with                            colon cancer diagnosed 10/2010 s/p open right                            hemicolectomy 11/2010. Tumor was adherent to the                            duodenum and pancreas and penetrates visceral                            peritoneum with 3/36 lymph nodes positive for                            lymphovascular invasion. Stage T4 N2. Surgical                            margins negative. Treated with FOLFOX, palliative                            radiation, then FOLFIRI, followed by nivolumab.                           Developed fixed severe stricture in the duodenum                            (chemotherapy induced?) which was not amenable to                            endoscopic intervention at [facility] and ultimately                            underwent gastrojejunostomy in 08/2015 for                            recalcitrant duodenal stricture. Medicines:                Monitored Anesthesia Care Procedure:                Pre-Anesthesia Assessment:                           - Prior to the procedure,  a History and Physical                            was performed, and patient medications and                            allergies were reviewed. The patient's tolerance of                            previous anesthesia was also reviewed. The risks                            and benefits of the procedure and the sedation                            options and risks were discussed with the patient.                             All questions were answered, and informed consent                            was obtained. Prior Anticoagulants: The patient has                            taken Xarelto  (rivaroxaban ), last dose was 2 days                            prior to procedure. ASA Grade Assessment: II - A                            patient with mild systemic disease. After reviewing                            the risks and benefits, the patient was deemed in                            satisfactory condition to undergo the procedure.                           After obtaining informed consent, the endoscope was                            passed under direct vision. Throughout the                            procedure, the patient's blood pressure, pulse, and                            oxygen saturations were monitored continuously. The                            GIF [id number] #[id number] was introduced through the  mouth, and advanced to the jejunum. The upper GI                            endoscopy was accomplished without difficulty. The                            patient tolerated the procedure well. Scope In: Scope Out: Findings:                 The examined esophagus was normal.                           Diffuse moderate inflammation characterized by                            congestion (edema) and erythema was found in the                            gastric fundus and in the gastric body. Biopsies                            were taken with a cold forceps for Helicobacter                            pylori testing. Estimated blood loss was minimal.                           Evidence of a gastrojejunostomy was found in the                            gastric body. This was characterized by healthy                            appearing mucosa. This was easily traversed.                           The examined jejunum was normal.                           The duodenal bulb was normal.                            An acquired severe stenosis was found in the second                            portion of the duodenum and was non-traversed. Complications:            No immediate complications. Estimated Blood Loss:     Estimated blood loss was minimal. Impression:               - Normal esophagus.                           - Gastritis. Biopsied.                           -  A gastrojejunostomy was found, characterized by                            healthy appearing mucosa.                           - Normal examined jejunum.                           - Normal duodenal bulb.                           - Acquired duodenal stenosis. Recommendation:           - Patient has a contact number available for                            emergencies. The signs and symptoms of potential                            delayed complications were discussed with the                            patient. Return to normal activities tomorrow.                            Written discharge instructions were provided to the                            patient.                           - Resume previous diet.                           - Continue present medications.                           - Await pathology results.                           - Resume Xarelto  (rivaroxaban ) at prior dose                            tomorrow.                           - Repeat upper endoscopy in 3 years for                            surveillance. Sandor Flatter, MD 12/02/2023 9:25:00 AM

## 2023-12-02 NOTE — Patient Instructions (Addendum)
 Please read handouts provided. Continue present medications. Await pathology results. Repeat colonoscopy in 1 year for screening. Resume Xarelto  ( rivaroxaban  ) at prior dose tomorrow. Resume previous diet. Repeat upper endoscopy in 3 years for screening.  YOU HAD AN ENDOSCOPIC PROCEDURE TODAY AT THE [facility]:   Refer to the procedure report that was given to you for any specific questions about what was found during the examination.  If the procedure report does not answer your questions, please call your gastroenterologist to clarify.  If you requested that your care partner not be given the details of your procedure findings, then the procedure report has been included in a sealed envelope for you to review at your convenience later.  YOU SHOULD EXPECT: Some feelings of bloating in the abdomen. Passage of more gas than usual.  Walking can help get rid of the air that was put into your GI tract during the procedure and reduce the bloating. If you had a lower endoscopy (such as a colonoscopy or flexible sigmoidoscopy) you may notice spotting of blood in your stool or on the toilet paper. If you underwent a bowel prep for your procedure, you may not have a normal bowel movement for a few days.  Please Note:  You might notice some irritation and congestion in your nose or some drainage.  This is from the oxygen used during your procedure.  There is no need for concern and it should clear up in a day or so.  SYMPTOMS TO REPORT IMMEDIATELY:  Following lower endoscopy (colonoscopy or flexible sigmoidoscopy):  Excessive amounts of blood in the stool  Significant tenderness or worsening of abdominal pains  Swelling of the abdomen that is new, acute  Fever of 100F or higher  Following upper endoscopy (EGD)  Vomiting of blood or coffee ground material  New chest pain or pain under the shoulder blades  Painful or persistently difficult swallowing  New shortness of breath  Fever of  100F or higher  Black, tarry-looking stools  For urgent or emergent issues, a gastroenterologist can be reached at any hour by calling [phone]. Do not use MyChart messaging for urgent concerns.    DIET:  We do recommend a small meal at first, but then you may proceed to your regular diet.  Drink plenty of fluids but you should avoid alcoholic beverages for 24 hours.  ACTIVITY:  You should plan to take it easy for the rest of today and you should NOT DRIVE or use heavy machinery until tomorrow (because of the sedation medicines used during the test).    FOLLOW UP: Our staff will call the number listed on your records the next business day following your procedure.  We will call around 7:15- 8:00 am to check on you and address any questions or concerns that you may have regarding the information given to you following your procedure. If we do not reach you, we will leave a message.     If any biopsies were taken you will be contacted by phone or by letter within the next 1-3 weeks.  Please call us  at [phone] if you have not heard about the biopsies in 3 weeks.    SIGNATURES/CONFIDENTIALITY: You and/or your care partner have signed paperwork which will be entered into your electronic medical record.  These signatures attest to the fact that that the information above on your After Visit Summary has been reviewed and is understood.  Full responsibility of the confidentiality of this discharge  information lies with you and/or your care-partner.

## 2023-12-02 NOTE — Progress Notes (Signed)
0837 Robinul 0.1 mg IV given due large amount of secretions upon assessment.  MD made aware, vss 

## 2023-12-03 ENCOUNTER — Telehealth: Payer: Self-pay | Admitting: *Deleted

## 2023-12-03 NOTE — Telephone Encounter (Signed)
  Follow up Call-     12/02/2023    7:56 AM 02/19/2022   10:37 AM  Call back number  Post procedure Call Back phone  # [phone]  Permission to leave phone message Yes Yes     Patient questions:  Do you have a fever, pain , or abdominal swelling? No. Pain Score  0 *  Have you tolerated food without any problems? Yes.    Have you been able to return to your normal activities? Yes.    Do you have any questions about your discharge instructions: Diet   No. Medications  No. Follow up visit  No.  Do you have questions or concerns about your Care? No.  Actions: * If pain score is 4 or above: No action needed, pain <4.

## 2023-12-07 LAB — SURGICAL PATHOLOGY

## 2023-12-20 ENCOUNTER — Ambulatory Visit: Payer: Self-pay | Admitting: Gastroenterology

## 2023-12-23 ENCOUNTER — Telehealth: Payer: Self-pay | Admitting: Medical Genetics

## 2023-12-23 DIAGNOSIS — Z1509 Genetic susceptibility to other malignant neoplasm: Secondary | ICD-10-CM

## 2023-12-23 LAB — GENECONNECT MOLECULAR SCREEN: Genetic Analysis Overall Interpretation: POSITIVE — AB

## 2023-12-23 NOTE — Telephone Encounter (Signed)
 [facility] Positive Result Note 12/23/2023 5:17 PM  FIRST ATTEMPT: Confirmed I was speaking with Terry Vargas [id number] by using name and DOB. Informed participant the reason for this call is to provide results for the above study. Results revealed Lynch Syndrome. Genetic counseling was offered and participant declined as this is a known diagnosis and he had this done previously. All questions were answered, and participant was thanked for their time and support of the above study. Participant was encouraged to contact [facility] if they have any further questions or concerns.     NCCN Guidelines for MLH1-related Lynch syndrome Pathogenic variants in MLH1 are associated with autosomal dominant Lynch syndrome and an increased cumulative risk of developing specific cancers, particularly colorectal, endometrial, and other related cancers. MLH1-associated cancer risks include: colorectal (46-61%), endometrial (34-54%), ovarian (4-20%), renal pelvis and/or ureter (0.2-5%), bladder (2-7%), gastric (5-7%), small bowel (0.4-11%), pancreatic (6.2%), biliary tract (1.9-3.7%), prostate (4.4-13.8%), and brain (0.7-1.7%). There may also be a small increased risk of developing skin cancer.  Patients of reproductive age should be counseled on options for prenatal diagnosis and assisted reproduction including pre-implantation genetic testing, including discussion of known risks, limitations, and benefits of these technologies. Additional reproductive considerations include the risk of a rare recessive syndrome called CMMRD syndrome Alean POUR, et al. J Med Genet [phone]). If both partners are a carrier of a pathogenic variant in the same MMR gene, then their future offspring will be at risk of having CMMRD syndrome.   General education regarding signs and symptoms of cancer(s), especially those mentioned above, is recommended in individuals with a genetic predisposition to cancer.  Additional medical management recommendations for individuals with pathogenic variants in MLH1 have been established by the [employer] (NCCN Guidelines version 3.2025) and vary by cancer type:  Colorectal Cancer High-quality colonoscopy every 1-2 years beginning at age 61-25 or 2-5 years prior to the earliest familial CRC if diagnosed before age 63. Daily aspirin intake is associated with a reduced risk of CRC in individuals with Lynch syndrome. Additional considerations for aspirin use can be found in NCCN Guidelines for Lynch Syndrome under MLH1 Lynch Syndrome: Surveillance/Prevention Strategies.  Gastric and Small Bowel Cancers Consider upper GI surveillance with high-quality EGD every 2-4 years beginning at age 81-40 (preferably in conjunction with colonoscopy). Random biopsy of the proximal and distal stomach should at minimum be performed on the initial procedure to assess for H. pylori, autoimmune gastritis, and intestinal metaplasia. Additional considerations for screening can be found in NCCN Guidelines for Lynch Syndrome under MLH1 Lynch Syndrome: Surveillance/Prevention Strategies. Individuals not undergoing upper endoscopic surveillance should have one-time noninvasive testing for H. pylori following a diagnosis of Lynch syndrome. Treatment is indicated if H. pylori is detected.  Urothelial Cancers (renal pelvis, ureter, and/or bladder) Surveillance may be considered in select individuals, including those with a family history of urothelial cancer. Options for surveillance may include annual urinalysis starting at age 67-35, although there is insufficient evidence to support a specific strategy for urothelial cancer screening in patients with Lynch syndrome.  Pancreatic Cancer MLH1 carriers with no family history of pancreatic cancer have not been shown to be at risk for this type of cancer. Consider pancreatic cancer screening (preferably in the setting of a  longitudinal study and/or at an experienced, high-volume center) beginning at age 1 (or 67 years younger than earliest exocrine pancreatic cancer diagnosis in the family, whichever is earlier). Additional considerations for pancreatic cancer screening can be found  in NCCN Guidelines for Lynch Syndrome under MLH1 Lynch Syndrome: Surveillance/Prevention Strategies and in NCCN Guidelines for Genetic/Familial High-Risk Assessment: Breast, Ovarian, and Pancreatic.  Prostate Cancer Consider beginning shared decision-making about prostate cancer screening at age 55. Consider screening at annual intervals rather than every other year if there is a family history of prostate cancer.  Brain Cancer Patients should be educated regarding signs and symptoms of neurological cancer and the importance of prompt reporting of symptoms to their physician(s).  Skin Manifestations Increased frequency of malignant and benign skin tumors such as sebaceous adenocarcinomas, sebaceous adenomas, and keratoacanthomas has been reported to be increased in patients with LS, although exact risk information is uncertain. An elevated risk of sebaceous tumors and keratoacanthoma has not been documented for MLH1 carriers. Consider skin exam every 1-2y with a healthcare provider skilled in identifying LS-associated skin manifestations. Age at surveillance initiation is uncertain and can be individualized.

## 2024-02-16 ENCOUNTER — Ambulatory Visit: Admitting: Physician Assistant

## 2024-02-16 ENCOUNTER — Other Ambulatory Visit (INDEPENDENT_AMBULATORY_CARE_PROVIDER_SITE_OTHER): Payer: Self-pay

## 2024-02-16 DIAGNOSIS — Z96642 Presence of left artificial hip joint: Secondary | ICD-10-CM

## 2024-02-16 NOTE — Progress Notes (Signed)
 Post-Op Visit Note   Patient: Terry Vargas           Date of Birth: 05/15/1961           MRN: [id number] Visit Date: 02/16/2024 PCP: Patient, No Pcp Per   Assessment & Plan:  Chief Complaint:  Chief Complaint  Patient presents with   Left Hip - Pain    08/16/23 Left THA     Visit Diagnoses:  1. Status post total replacement of left hip     Plan: Patient is a pleasant 63 year old gentleman who comes in today approximately 6 months status post left total hip replacement 08/16/2023.  He has been doing great.  No pain.  Examination of his left hip reveals painless hip flexion logroll.  He is neurovascularly intact distally.  At this point, he will continue to advance with activity.  Follow-up in 6 months for repeat evaluation and AP pelvis x-rays.  Call with concerns or questions.  Follow-Up Instructions: Return in about 6 months (around 08/16/2024).   Orders:  Orders Placed This Encounter  Procedures   XR HIP UNILAT W OR W/O PELVIS 2-3 VIEWS LEFT   No orders of the defined types were placed in this encounter.   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   Skin cancer    DVT (deep venous thrombosis) (HCC) 09/23/2011   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   DVT of leg (deep venous thrombosis) (HCC) 01/28/2011   Colon cancer (HCC) 12/02/2010   Past Medical History:  Diagnosis Date   Abdominal  mass, RUQ (right upper quadrant)    Arthritis    Clotting disorder 2012, 2020   dvt left leg- on xarelto     Colon cancer (HCC) 12/02/2010   invasive adenocarcinoma, non-polyposis   DVT (deep venous thrombosis) (HCC) 01/28/2011   left leg   Heart murmur    History of chemotherapy    LAST CHEMO 12-23-2012   History of pneumonia 1992   History of radiation therapy 03/14/12 - 04/04/12   retroperitoneal/abdominal region   Hyperlipidemia    no longer have   Hypertension    no longer have   Low iron    history of - no issues now   Skin cancer    squamous cell-L ear, basal cell- face/chest    Family History  Problem Relation Age of Onset   Kidney cancer Father    Colon cancer Father        1986, 1991   Skin cancer Father        squamous cell-forehead, nose   Colon cancer Paternal Grandmother    Prostate cancer Paternal Grandfather    Colon cancer Cousin 35       paternal   Colon cancer Cousin 52       paternal   Colon cancer Paternal Aunt    Colon polyps Neg Hx    Esophageal cancer Neg Hx  Rectal cancer Neg Hx    Stomach cancer Neg Hx     Past Surgical History:  Procedure Laterality Date    pot-a-cath insertion     COLON SURGERY     COLONOSCOPY     EUS N/A 01/12/2013   Procedure: UPPER ENDOSCOPIC ULTRASOUND (EUS) LINEAR;  Surgeon: Toribio SHAUNNA Cedar, MD;  Location: [facility];  Service: Endoscopy;  Laterality: N/A;  celiac plexus    gastrojejunostomy     MOHS SURGERY  1996   left ear- squamous cell   open right colectomy  11/25/2010   hemicolectomy w/appendectomy   POLYPECTOMY     PORT-A-CATH REMOVAL Left 08/04/2012   Procedure: REMOVAL PORT-A-CATH;  Surgeon: Donnice Bury, MD;  Location: [facility] OR;  Service: General;  Laterality: Left;   PORT-A-CATH REMOVAL  2014   PORTACATH PLACEMENT Left 08/15/2012   Procedure: INSERTION PORT-A-CATH;  Surgeon: Donnice Bury, MD;  Location: [facility];  Service: General;  Laterality: Left;   ROTATOR CUFF REPAIR  Right    SKIN CANCER EXCISION     TONSILLECTOMY     as child   TOTAL HIP ARTHROPLASTY Left 08/16/2023   Procedure: LEFT TOTAL HIP ARTHROPLASTY ANTERIOR APPROACH;  Surgeon: Jerri Kay HERO, MD;  Location: [facility] OR;  Service: Orthopedics;  Laterality: Left;  3-C   Social History   Occupational History   Not on file  Tobacco Use   Smoking status: Never   Smokeless tobacco: Never  Vaping Use   Vaping status: Never Used  Substance and Sexual Activity   Alcohol  use: Yes    Alcohol /week: 2.0 - 4.0 standard drinks of alcohol     Types: 1 - 2 Cans of beer, 1 - 2 Shots of liquor per week    Comment: 1-2 drinks daily   Drug use: No   Sexual activity: Not on file

## 2024-03-20 ENCOUNTER — Encounter: Payer: Self-pay | Admitting: Radiology

## 2024-03-28 ENCOUNTER — Ambulatory Visit (INDEPENDENT_AMBULATORY_CARE_PROVIDER_SITE_OTHER): Admitting: Student in an Organized Health Care Education/Training Program

## 2024-03-28 ENCOUNTER — Encounter: Payer: Self-pay | Admitting: Student in an Organized Health Care Education/Training Program

## 2024-03-28 VITALS — BP 136/84 | HR 64 | Ht 67.0 in | Wt 162.0 lb

## 2024-03-28 DIAGNOSIS — D6859 Other primary thrombophilia: Secondary | ICD-10-CM | POA: Diagnosis not present

## 2024-03-28 DIAGNOSIS — Z13220 Encounter for screening for lipoid disorders: Secondary | ICD-10-CM

## 2024-03-28 DIAGNOSIS — E538 Deficiency of other specified B group vitamins: Secondary | ICD-10-CM | POA: Diagnosis not present

## 2024-03-28 DIAGNOSIS — E611 Iron deficiency: Secondary | ICD-10-CM | POA: Insufficient documentation

## 2024-03-28 DIAGNOSIS — Z114 Encounter for screening for human immunodeficiency virus [HIV]: Secondary | ICD-10-CM

## 2024-03-28 DIAGNOSIS — Z15068 Genetic susceptibility to other malignant neoplasm of digestive system: Secondary | ICD-10-CM

## 2024-03-28 DIAGNOSIS — I10 Essential (primary) hypertension: Secondary | ICD-10-CM | POA: Insufficient documentation

## 2024-03-28 DIAGNOSIS — Z1507 Genetic susceptibility to malignant neoplasm of urinary tract: Secondary | ICD-10-CM

## 2024-03-28 DIAGNOSIS — Z131 Encounter for screening for diabetes mellitus: Secondary | ICD-10-CM

## 2024-03-28 DIAGNOSIS — Z15060 Genetic susceptibility to colorectal cancer: Secondary | ICD-10-CM | POA: Diagnosis not present

## 2024-03-28 DIAGNOSIS — Z1159 Encounter for screening for other viral diseases: Secondary | ICD-10-CM

## 2024-03-28 DIAGNOSIS — Z1509 Genetic susceptibility to other malignant neoplasm: Secondary | ICD-10-CM

## 2024-03-28 LAB — LIPID PANEL
Cholesterol: 185 mg/dL (ref 0–200)
HDL: 67.5 mg/dL (ref >39.00)
LDL Cholesterol: 98 mg/dL (ref 0–99)
NonHDL: 117.64
Total CHOL/HDL Ratio: 3
Triglycerides: 97 mg/dL (ref 0.0–149.0)
VLDL: 19.4 mg/dL (ref 0.0–40.0)

## 2024-03-28 LAB — BASIC METABOLIC PANEL WITH GFR
BUN: 17 mg/dL (ref 6–23)
CO2: 29 meq/L (ref 19–32)
Calcium: 9 mg/dL (ref 8.4–10.5)
Chloride: 102 meq/L (ref 96–112)
Creatinine, Ser: 1.14 mg/dL (ref 0.40–1.50)
GFR: 68.72 mL/min (ref >60.00)
Glucose, Bld: 88 mg/dL (ref 70–99)
Potassium: 4.5 meq/L (ref 3.5–5.1)
Sodium: 139 meq/L (ref 135–145)

## 2024-03-28 LAB — HEMOGLOBIN A1C: Hgb A1c MFr Bld: 5.9 % (ref 4.6–6.5)

## 2024-03-28 LAB — IBC + FERRITIN
Ferritin: 28 ng/mL (ref 22.0–322.0)
Iron: 128 ug/dL (ref 42–165)
Saturation Ratios: 34.5 % (ref 20.0–50.0)
TIBC: 371 ug/dL (ref 250.0–450.0)
Transferrin: 265 mg/dL (ref 212.0–360.0)

## 2024-03-28 LAB — VITAMIN B12: Vitamin B-12: >1500 pg/mL — ABNORMAL HIGH (ref 211–911)

## 2024-03-28 NOTE — Assessment & Plan Note (Signed)
 At risk for B12 deficiency because of a gastrojejunostomy and duodenal disease.  He is having increasing fatigue recently.  I recommended an over-the-counter oral B12 supplements daily.  Will check B12 levels today.

## 2024-03-28 NOTE — Assessment & Plan Note (Signed)
 Discovered in 2012 on colonoscopy it, has a history of colon cancer at that same time. Stage T4N2. MSI High. KRAS wild-type.  This was treated with partial colectomy and chemotherapy with FOLFOX.  He continues to be managed with [facility].  He has a local GI and receives colonoscopies every 1 year and upper endoscopies every 3 years.  Currently he is doing very well.  In February he had an increase in his CEA biomarker from 2.0 to 3.1.  Subsequently had a PET scan that was reassuring.  Last colonoscopy was in July and was reassuring with no polyps or masses.  Will check another CEA today.  We talked about the uncertain benefits of low-dose aspirin for colon cancer prevention.  In his case I recommended against it because he is also using Xarelto .

## 2024-03-28 NOTE — Patient Instructions (Signed)
  VISIT SUMMARY: You had a follow-up appointment to establish care and review your current health status. We discussed your history of Lynch syndrome, stage IV colon cancer, deep vein thrombosis, skin cancers, hypertension, and other health concerns. We also reviewed your current medications and lifestyle habits.  YOUR PLAN: -PERSONAL HISTORY OF LYNCH SYNDROME: Lynch syndrome is a genetic condition that increases your risk of colon cancer. Your recent PET scan was clean, but your CEA levels have risen slightly. We will continue regular surveillance with colonoscopies, CT scans, and blood checks. Your CEA levels were checked today in preparation for your upcoming visit to [facility] in February.  -HISTORY OF STAGE IV COLON CANCER, STATUS POST PARTIAL COLECTOMY: Stage IV colon cancer means the cancer has spread to other parts of the body. You had surgery and chemotherapy, and participated in an immunotherapy trial. Continue regular follow-ups with your oncology team at [facility].  -HISTORY OF DUODENAL STRICTURE AND ULCER, STATUS POST GASTROENTEROSTOMY: A duodenal stricture and ulcer can block the passage of food from the stomach to the small intestine. This was treated with surgery. We discussed dietary modifications, and you should continue regular endoscopies every three years and monitor for symptoms of iron and B12 deficiency.  -HISTORY OF DEEP VEIN THROMBOSIS, ON CHRONIC ANTICOAGULATION: Deep vein thrombosis is a blood clot in a deep vein, usually in the legs. You have been on Xarelto  since 2012 to prevent further clots. Continue taking Xarelto  as prescribed.  -HISTORY OF SQUAMOUS CELL CARCINOMA OF SKIN, STATUS POST MOHS SURGERY: Squamous cell carcinoma is a type of skin cancer. It was treated with Mohs surgery. Continue regular follow-ups with your dermatologist every six months.  -HEART MURMUR: A heart murmur is an unusual sound heard between heartbeats. It has not caused any symptoms or complications.  Continue monitoring for any new symptoms.  -DIARRHEA SECONDARY TO PARTIAL COLECTOMY: Chronic diarrhea can occur after part of the colon is removed. Manage this with dietary modifications and staying hydrated.  -FATIGUE: Chronic fatigue can be a result of cancer treatment and your overall health status. Continue with lifestyle modifications and use energy supplements as needed.  -GENERAL HEALTH MAINTENANCE: We discussed monitoring your cholesterol, blood sugar, and blood pressure. Continue to exercise and maintain a healthy diet. Consider B12 and iron supplements due to your duodenal surgery. Your cholesterol and A1c levels were checked today. Monitor your blood pressure at home, aiming for less than 140/85.  INSTRUCTIONS: Continue regular surveillance with colonoscopies, CT scans, and blood checks for Lynch syndrome. Follow up with your oncology team at [facility] for your stage IV colon cancer. Continue regular endoscopies every three years for your duodenal stricture and ulcer. Maintain regular dermatology follow-ups every six months for  your skin cancer history. Monitor your blood pressure at home, aiming for less than 140/85. Consider B12 and iron supplementation.

## 2024-03-28 NOTE — Assessment & Plan Note (Signed)
 Chronic and stable.  He had an unprovoked right leg DVT in 2012 around the time that he was diagnosed with colon cancer.  He has been anticoagulated ever since with Xarelto .  No bleeding issues.  Normal renal function.  He has been using aspirin 81 mg daily because he read that it may lower his risk of colon cancer in the setting of Lynch syndrome.  We talked about the risks of using both aspirin and Xarelto  together and I recommended using just Xarelto .  We talked about the uncertain benefits of aspirin for colon cancer prevention.  He is interested in perhaps coming off the Xarelto  in the future.  I am open to that idea, he is only had 1 clotting event and it was in the setting of active cancer.  If he wants to come off Xarelto , would be happy to work with him on serial D-dimer tests to do that safely.

## 2024-03-28 NOTE — Assessment & Plan Note (Signed)
 At high risk for iron deficiency given history of jejunostomy due to duodenal stricture and ulceration.  He is having increasing fatigue recently.  Will check iron studies and a CBC.  Likely is going to need oral iron supplementation.
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
## 2024-03-28 NOTE — Assessment & Plan Note (Signed)
 Intermittent issue.  Blood pressure mildly elevated today consistent with stage I hypertension.  His blood pressures at home are consistently better than they are in the physician office.  Does a good job of checking ambulatory blood pressures.  Has used lisinopril in the past.  He is otherwise overall so healthy I think it is okay to continue to watch this.  I set a goal blood pressures less than 140/85.  I recommended if he is seeing home blood pressures over that level on a consistent basis that I would recommend initiating losartan at least.

## 2024-03-29 ENCOUNTER — Ambulatory Visit: Payer: Self-pay | Admitting: Student in an Organized Health Care Education/Training Program

## 2024-03-29 LAB — CEA: CEA: 2.5 ng/mL — ABNORMAL HIGH

## 2024-03-29 LAB — HEPATITIS C ANTIBODY: Hepatitis C Ab: NONREACTIVE

## 2024-03-29 LAB — HIV ANTIBODY (ROUTINE TESTING W REFLEX)
HIV 1&2 Ab, 4th Generation: NONREACTIVE
HIV FINAL INTERPRETATION: NEGATIVE

## 2024-04-10 ENCOUNTER — Ambulatory Visit: Admitting: Student in an Organized Health Care Education/Training Program

## 2024-08-16 ENCOUNTER — Ambulatory Visit: Admitting: Orthopaedic Surgery

## 2025-03-29 ENCOUNTER — Encounter: Admitting: Student in an Organized Health Care Education/Training Program
